# Patient Record
Sex: Female | Born: 1980 | Race: White | Hispanic: No | Smoking: Former smoker
Health system: Southern US, Community
[De-identification: ages and names within clinical notes are randomized; demographics above are authoritative.]

## PROBLEM LIST (undated history)

## (undated) DIAGNOSIS — T7840XA Allergy, unspecified, initial encounter: Secondary | ICD-10-CM

## (undated) DIAGNOSIS — F419 Anxiety disorder, unspecified: Secondary | ICD-10-CM

## (undated) DIAGNOSIS — F32A Depression, unspecified: Secondary | ICD-10-CM

## (undated) DIAGNOSIS — E785 Hyperlipidemia, unspecified: Secondary | ICD-10-CM

## (undated) DIAGNOSIS — Z9889 Other specified postprocedural states: Secondary | ICD-10-CM

## (undated) DIAGNOSIS — Z8489 Family history of other specified conditions: Secondary | ICD-10-CM

## (undated) DIAGNOSIS — T8859XA Other complications of anesthesia, initial encounter: Secondary | ICD-10-CM

## (undated) DIAGNOSIS — G4730 Sleep apnea, unspecified: Secondary | ICD-10-CM

## (undated) DIAGNOSIS — R112 Nausea with vomiting, unspecified: Secondary | ICD-10-CM

## (undated) DIAGNOSIS — R51 Headache: Secondary | ICD-10-CM

## (undated) DIAGNOSIS — I10 Essential (primary) hypertension: Secondary | ICD-10-CM

## (undated) DIAGNOSIS — J349 Unspecified disorder of nose and nasal sinuses: Secondary | ICD-10-CM

## (undated) DIAGNOSIS — M1990 Unspecified osteoarthritis, unspecified site: Secondary | ICD-10-CM

## (undated) DIAGNOSIS — R5383 Other fatigue: Secondary | ICD-10-CM

## (undated) DIAGNOSIS — R0602 Shortness of breath: Secondary | ICD-10-CM

## (undated) HISTORY — PX: FRACTURE SURGERY: SHX138

## (undated) HISTORY — DX: Anxiety disorder, unspecified: F41.9

## (undated) HISTORY — DX: Other fatigue: R53.83

## (undated) HISTORY — DX: Essential (primary) hypertension: I10

## (undated) HISTORY — DX: Headache: R51

## (undated) HISTORY — DX: Allergy, unspecified, initial encounter: T78.40XA

## (undated) HISTORY — DX: Shortness of breath: R06.02

## (undated) HISTORY — DX: Unspecified osteoarthritis, unspecified site: M19.90

## (undated) HISTORY — DX: Depression, unspecified: F32.A

## (undated) HISTORY — DX: Hyperlipidemia, unspecified: E78.5

## (undated) HISTORY — DX: Sleep apnea, unspecified: G47.30

## (undated) HISTORY — DX: Unspecified disorder of nose and nasal sinuses: J34.9

---
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
## 2011-11-01 ENCOUNTER — Ambulatory Visit (INDEPENDENT_AMBULATORY_CARE_PROVIDER_SITE_OTHER): Payer: BC Managed Care – PPO | Admitting: Family

## 2011-11-01 ENCOUNTER — Encounter: Payer: Self-pay | Admitting: Family

## 2011-11-01 VITALS — BP 126/100 | Ht 62.0 in | Wt 272.0 lb

## 2011-11-01 DIAGNOSIS — I10 Essential (primary) hypertension: Secondary | ICD-10-CM

## 2011-11-01 DIAGNOSIS — G43909 Migraine, unspecified, not intractable, without status migrainosus: Secondary | ICD-10-CM

## 2011-11-01 MED ORDER — LOSARTAN POTASSIUM 50 MG PO TABS: 50.0000 mg | ORAL_TABLET | Freq: Every day | ORAL | Status: DC

## 2011-11-01 NOTE — Patient Instructions (Addendum)
Hypertension As your heart beats, it forces blood through your arteries. This force is your blood pressure. If the pressure is too high, it is called hypertension (HTN) or high blood pressure. HTN is dangerous because you may have it and not know it. High blood pressure may mean that your heart has to work harder to pump blood. Your arteries may be narrow or stiff. The extra work puts you at risk for heart disease, stroke, and other problems.  Blood pressure consists of two numbers, a higher number over a lower, 110/72, for example. It is stated as "110 over 72." The ideal is below 120 for the top number (systolic) and under 80 for the bottom (diastolic). Write down your blood pressure today. You should pay close attention to your blood pressure if you have certain conditions such as:  Heart failure.   Prior heart attack.   Diabetes   Chronic kidney disease.   Prior stroke.   Multiple risk factors for heart disease.  To see if you have HTN, your blood pressure should be measured while you are seated with your arm held at the level of the heart. It should be measured at least twice. A one-time elevated blood pressure reading (especially in the Emergency Department) does not mean that you need treatment. There may be conditions in which the blood pressure is different between your right and left arms. It is important to see your caregiver soon for a recheck. Most people have essential hypertension which means that there is not a specific cause. This type of high blood pressure may be lowered by changing lifestyle factors such as:  Stress.   Smoking.   Lack of exercise.   Excessive weight.   Drug/tobacco/alcohol use.   Eating less salt.  Most people do not have symptoms from high blood pressure until it has caused damage to the body. Effective treatment can often prevent, delay or reduce that damage. TREATMENT  When a cause has been identified, treatment for high blood pressure is  directed at the cause. There are a large number of medications to treat HTN. These fall into several categories, and your caregiver will help you select the medicines that are best for you. Medications may have side effects. You should review side effects with your caregiver. If your blood pressure stays high after you have made lifestyle changes or started on medicines,   Your medication(s) may need to be changed.   Other problems may need to be addressed.   Be certain you understand your prescriptions, and know how and when to take your medicine.   Be sure to follow up with your caregiver within the time frame advised (usually within two weeks) to have your blood pressure rechecked and to review your medications.   If you are taking more than one medicine to lower your blood pressure, make sure you know how and at what times they should be taken. Taking two medicines at the same time can result in blood pressure that is too low.  SEEK IMMEDIATE MEDICAL CARE IF:  You develop a severe headache, blurred or changing vision, or confusion.   You have unusual weakness or numbness, or a faint feeling.   You have severe chest or abdominal pain, vomiting, or breathing problems.  MAKE SURE YOU:   Understand these instructions.   Will watch your condition.   Will get help right away if you are not doing well or get worse.  Document Released: 04/17/2005 Document Revised: 04/06/2011 Document Reviewed:   12/06/2007 [employer] Patient Information 2012 [place], [place].  Obesity Obesity is defined as having a body mass index (BMI) of 30 or more. To calculate your BMI divide your weight in pounds by your height in inches squared and multiply that product by 703. Major illnesses resulting from long-term obesity include:  Stroke.   Heart disease.   Diabetes.   Many cancers.   Arthritis.  Obesity also complicates recovery from many other medical problems.  CAUSES   A history of obesity in your  parents.   Thyroid hormone imbalance.   Environmental factors such as excess calorie intake and physical inactivity.  TREATMENT  A healthy weight loss program includes:  A calorie restricted diet based on individual calorie needs.   Increased physical activity (exercise).  An exercise program is just as important as the right low-calorie diet.  Weight-loss medicines should be used only under the supervision of your physician. These medicines help, but only if they are used with diet and exercise programs. Medicines can have side effects including nervousness, nausea, abdominal pain, diarrhea, headache, drowsiness, and depression.  An unhealthy weight loss program includes:  Fasting.   Fad diets.   Supplements and drugs.  These choices do not succeed in long-term weight control.  HOME CARE INSTRUCTIONS  To help you make the needed dietary changes:   Exercise and perform physical activity as directed by your caregiver.   Keep a daily record of everything you eat. There are many free websites to help you with this. It may be helpful to measure your foods so you can determine if you are eating the correct portion sizes.   Use low-calorie cookbooks or take special cooking classes.   Avoid alcohol. Drink more water and drinks with no calories.   Take vitamins and supplements only as recommended by your caregiver.   Weight loss support groups, Registered Dieticians, counselors, and stress reduction education can also be very helpful.  Document Released: 05/25/2004 Document Revised: 04/06/2011 Document Reviewed: 03/24/2007 [facility] Patient Information 2012 [place], [place].

## 2011-11-01 NOTE — Progress Notes (Signed)
Subjective:    Patient ID: Tara Kelley, female    DOB: 1981/03/09, 31 y.o.   MRN: [id number]  HPI 31 year old white female, new patient to the practice, nonsmoker is in to be established. She has a history of hypertension, migraine headaches, and bronchitis. She's currently taking losartan 25 mg once daily. She is currently going through a divorce. Recently relocated here from [place]. She has began working out with a trainer in attempts to reduce her weight him blood pressure. Over the last month, she's lost 10 pounds.  Patient has a history of migraine headaches and had been taken Fioricet for them. Additionally she has headaches about 2-3 times per week. Since she's been in [place], she has not had any headaches. She has Fioricet when necessary if needed.  Patient has a history of bronchitis but typically only flares when she exercises. She has an albuterol inhaler that she uses rarely.   Review of Systems  Constitutional: Negative.   HENT: Negative.   Respiratory: Negative.   Cardiovascular: Negative.   Gastrointestinal: Negative.   Genitourinary: Negative.   Musculoskeletal: Negative.   Skin: Negative.   Neurological: Negative.   Hematological: Negative.   Psychiatric/Behavioral: Negative.    Past Medical History  Diagnosis Date  . Hypertension   . Headache     History   Social History  . Marital Status: Legally Separated    Spouse Name: N/A    Number of Children: N/A  . Years of Education: N/A   Occupational History  . Not on file.   Social History Main Topics  . Smoking status: Never Smoker   . Smokeless tobacco: Not on file  . Alcohol Use: Yes     rarely  . Drug Use: No  . Sexually Active: Not on file   Other Topics Concern  . Not on file   Social History Narrative  . No narrative on file    Past Surgical History  Procedure Date  . Appendectomy     No family history on file.  Allergies  Allergen Reactions  . Wasp Venom  Anaphylaxis  . Codeine     GI issues  . Sulfa Drugs Cross Reactors     Current Outpatient Prescriptions on File Prior to Visit  Medication Sig Dispense Refill  . albuterol (PROVENTIL HFA;VENTOLIN HFA) 108 (90 BASE) MCG/ACT inhaler Inhale 2 puffs into the lungs every 6 (six) hours as needed.      Marland Kitchen losartan (COZAAR) 50 MG tablet Take 1 tablet (50 mg total) by mouth daily.  30 tablet  0  . norethindrone-ethinyl estradiol (JUNEL FE,GILDESS FE,LOESTRIN FE) 1-20 MG-MCG tablet Take 1 tablet by mouth daily.        BP 126/100  Ht 5\' 2"  (1.575 m)  Wt 272 lb (123.378 kg)  BMI 49.75 kg/m2  LMP 06/26/2013chart    Objective:   Physical Exam  Constitutional: She is oriented to person, place, and time. She appears well-developed and well-nourished.  HENT:  Right Ear: External ear normal.  Left Ear: External ear normal.  Nose: Nose normal.  Mouth/Throat: Oropharynx is clear and moist.  Neck: Normal range of motion. Neck supple.  Cardiovascular: Normal rate, regular rhythm and normal heart sounds.   Pulmonary/Chest: Effort normal and breath sounds normal.  Abdominal: Soft. Bowel sounds are normal.  Musculoskeletal: Normal range of motion.  Neurological: She is alert and oriented to person, place, and time.  Skin: Skin is warm and dry.  Psychiatric: She has a normal mood and  affect.          Assessment & Plan:  Assessment: Obesity, hypertension, migraine headaches  Plan: Strongly encouraged her to continue exercising and follow healthy diet, low-sodium. Increased will start him to 50 mg once daily. Referral given for dentist and gynecologist in the area. We'll bring patient back for recheck in 2-3 weeks and sooner when necessary.

## 2011-11-09 ENCOUNTER — Encounter: Payer: Self-pay | Admitting: Family Medicine

## 2011-11-09 ENCOUNTER — Encounter: Payer: Self-pay | Admitting: Internal Medicine

## 2011-11-09 ENCOUNTER — Ambulatory Visit (INDEPENDENT_AMBULATORY_CARE_PROVIDER_SITE_OTHER): Payer: BC Managed Care – PPO | Admitting: Internal Medicine

## 2011-11-09 ENCOUNTER — Ambulatory Visit (INDEPENDENT_AMBULATORY_CARE_PROVIDER_SITE_OTHER)
Admission: RE | Admit: 2011-11-09 | Discharge: 2011-11-09 | Disposition: A | Discharge status: Home / Self Care | Payer: BC Managed Care – PPO | Source: Ambulatory Visit | Attending: Internal Medicine | Admitting: Internal Medicine

## 2011-11-09 VITALS — BP 174/110 | HR 102 | Temp 98.4°F | Wt 269.0 lb

## 2011-11-09 DIAGNOSIS — S161XXA Strain of muscle, fascia and tendon at neck level, initial encounter: Secondary | ICD-10-CM

## 2011-11-09 DIAGNOSIS — S139XXA Sprain of joints and ligaments of unspecified parts of neck, initial encounter: Secondary | ICD-10-CM

## 2011-11-09 DIAGNOSIS — I10 Essential (primary) hypertension: Secondary | ICD-10-CM

## 2011-11-09 MED ORDER — CYCLOBENZAPRINE HCL 5 MG PO TABS: ORAL_TABLET | ORAL | Status: DC
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
## 2011-11-09 NOTE — Progress Notes (Signed)
Quick Note:  Tell patient that x ray shows no acute abnormality. ______ 
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
## 2011-11-15 ENCOUNTER — Ambulatory Visit: Payer: BC Managed Care – PPO | Admitting: Family

## 2011-11-22 ENCOUNTER — Encounter: Payer: Self-pay | Admitting: Family

## 2011-11-22 ENCOUNTER — Ambulatory Visit (INDEPENDENT_AMBULATORY_CARE_PROVIDER_SITE_OTHER): Payer: BC Managed Care – PPO | Admitting: Family

## 2011-11-22 VITALS — BP 172/102 | HR 78 | Temp 98.2°F | Wt 268.0 lb

## 2011-11-22 DIAGNOSIS — I10 Essential (primary) hypertension: Secondary | ICD-10-CM

## 2011-11-22 MED ORDER — LOSARTAN POTASSIUM 100 MG PO TABS: 100.0000 mg | ORAL_TABLET | Freq: Every day | ORAL | Status: DC

## 2011-11-22 MED ORDER — PHENTERMINE HCL 37.5 MG PO CAPS: 37.5000 mg | ORAL_CAPSULE | ORAL | Status: DC

## 2011-11-22 NOTE — Progress Notes (Signed)
Subjective:    Patient ID: Tara Kelley, female    DOB: 1981/01/16, 31 y.o.   MRN: [id number]  HPI 31 year old female nonsmoker, obese female is in today for a recheck of HTN. She is currently taking Cozaar 50mg  and tolerating it well. She is working out with a trainer 3-4 times a week and has lost 16 lbs in the last 7 weeks. She is growing frustrated with her weight. No family history of cardiovascular disease or sudden death.   Review of Systems  Constitutional: Negative.   HENT: Negative.   Respiratory: Negative.   Cardiovascular: Negative.   Gastrointestinal: Negative.   Musculoskeletal: Negative.   Skin: Negative.   Neurological: Negative.   Psychiatric/Behavioral: Negative.    Past Medical History  Diagnosis Date  . Hypertension   . Headache     History   Social History  . Marital Status: Legally Separated    Spouse Name: N/A    Number of Children: N/A  . Years of Education: N/A   Occupational History  . Not on file.   Social History Main Topics  . Smoking status: Never Smoker   . Smokeless tobacco: Not on file  . Alcohol Use: Yes     rarely  . Drug Use: No  . Sexually Active: Not on file   Other Topics Concern  . Not on file   Social History Narrative  . No narrative on file    Past Surgical History  Procedure Date  . Appendectomy     No family history on file.  Allergies  Allergen Reactions  . Wasp Venom Anaphylaxis  . Codeine     GI issues  . Sulfa Drugs Cross Reactors     Current Outpatient Prescriptions on File Prior to Visit  Medication Sig Dispense Refill  . albuterol (PROVENTIL HFA;VENTOLIN HFA) 108 (90 BASE) MCG/ACT inhaler Inhale 2 puffs into the lungs every 6 (six) hours as needed.      . cyclobenzaprine (FLEXERIL) 5 MG tablet 1- 2 po tid if needed  30 tablet  1  . EPINEPHrine (EPIPEN JR) 0.15 MG/0.3ML injection Inject 0.15 mg into the muscle as needed.      Marland Kitchen losartan (COZAAR) 100 MG tablet Take 1 tablet (100 mg total) by mouth  daily.  30 tablet  4  . Multiple Vitamin (MULTIVITAMIN) tablet Take 1 tablet by mouth daily.      . norethindrone-ethinyl estradiol (JUNEL FE,GILDESS FE,LOESTRIN FE) 1-20 MG-MCG tablet Take 1 tablet by mouth daily.      . phentermine 37.5 MG capsule Take 1 capsule (37.5 mg total) by mouth every morning.  30 capsule  0    BP 172/102  Pulse 78  Temp 98.2 F (36.8 C) (Oral)  Wt 268 lb (121.564 kg)  SpO2 98%  LMP 06/26/2013chart    Objective:   Physical Exam  Constitutional: She is oriented to person, place, and time. She appears well-developed and well-nourished.  HENT:  Right Ear: External ear normal.  Left Ear: External ear normal.  Nose: Nose normal.  Mouth/Throat: Oropharynx is clear and moist.  Neck: Normal range of motion. Neck supple.  Cardiovascular: Normal rate, regular rhythm and normal heart sounds.   Pulmonary/Chest: Effort normal and breath sounds normal.  Abdominal: Soft. Bowel sounds are normal.  Neurological: She is alert and oriented to person, place, and time.  Skin: Skin is warm and dry.  Psychiatric: She has a normal mood and affect.  Assessment & Plan:  Assessment: Hypertension, Obesity  Plan: Increase Cozaar 100mg  QD. Start Adipex 37.5 1 po QD Warned against potential side effects. Recheck in one month.

## 2011-12-07 ENCOUNTER — Telehealth: Payer: Self-pay | Admitting: Family

## 2011-12-07 MED ORDER — LOSARTAN POTASSIUM 100 MG PO TABS: 100.0000 mg | ORAL_TABLET | Freq: Every day | ORAL | Status: DC

## 2011-12-07 NOTE — Telephone Encounter (Signed)
Pt called and said that she lost her written script for Losarten 100mg  and is req that this be called in to [facility] on [address] and [address]. Pt is out of med.

## 2011-12-07 NOTE — Telephone Encounter (Signed)
Rx sent 

## 2011-12-26 ENCOUNTER — Ambulatory Visit (INDEPENDENT_AMBULATORY_CARE_PROVIDER_SITE_OTHER): Payer: BC Managed Care – PPO | Admitting: Family

## 2011-12-26 ENCOUNTER — Encounter: Payer: Self-pay | Admitting: Family

## 2011-12-26 VITALS — BP 120/80 | Temp 98.3°F | Wt 260.0 lb

## 2011-12-26 DIAGNOSIS — I10 Essential (primary) hypertension: Secondary | ICD-10-CM

## 2011-12-26 NOTE — Progress Notes (Signed)
  Subjective:    Patient ID: Tara Kelley, female    DOB: 1980-10-24, 31 y.o.   MRN: [id number]  HPI Patient left prior to being seen.    Review of Systems     Objective:   Physical Exam        Assessment & Plan:

## 2012-01-17 ENCOUNTER — Ambulatory Visit (INDEPENDENT_AMBULATORY_CARE_PROVIDER_SITE_OTHER): Payer: BC Managed Care – PPO | Admitting: Family

## 2012-01-17 ENCOUNTER — Encounter: Payer: Self-pay | Admitting: Family

## 2012-01-17 VITALS — HR 86 | Temp 98.2°F | Wt 258.0 lb

## 2012-01-17 DIAGNOSIS — E669 Obesity, unspecified: Secondary | ICD-10-CM

## 2012-01-17 DIAGNOSIS — E7800 Pure hypercholesterolemia, unspecified: Secondary | ICD-10-CM

## 2012-01-17 DIAGNOSIS — I10 Essential (primary) hypertension: Secondary | ICD-10-CM

## 2012-01-18 NOTE — Progress Notes (Signed)
Subjective:    Patient ID: Tara Kelley, female    DOB: 11-14-1980, 31 y.o.   MRN: [id number]  HPI 31 year old white female, nonsmoker is in for recheck of obesity. She's currently taken phentermine 37.5 mg once daily. She is exercising 6 days a week. Is also having to follow healthy diet. She has only lost 2 pounds within the last one month. Overall, she has lost 14 pounds. She feels like she has had a plateau. She's tolerating the medication well. Does complain of feeling more hungry than she did in the beginning.  She has also brought labs in today showing a total cholesterol of 238. LDL 138. TSH normal, T4 slightly elevated, T3 slightly low.  Review of Systems  Constitutional: Positive for appetite change.  HENT: Negative.   Respiratory: Negative.   Cardiovascular: Negative.   Gastrointestinal: Negative.   Genitourinary: Negative.   Musculoskeletal: Negative.   Skin: Negative.   Hematological: Negative.   Psychiatric/Behavioral: Negative.    Past Medical History  Diagnosis Date  . Hypertension   . Headache     History   Social History  . Marital Status: Legally Separated    Spouse Name: N/A    Number of Children: N/A  . Years of Education: N/A   Occupational History  . Not on file.   Social History Main Topics  . Smoking status: Never Smoker   . Smokeless tobacco: Not on file  . Alcohol Use: Yes     rarely  . Drug Use: No  . Sexually Active: Not on file   Other Topics Concern  . Not on file   Social History Narrative  . No narrative on file    Past Surgical History  Procedure Date  . Appendectomy     No family history on file.  Allergies  Allergen Reactions  . Wasp Venom Anaphylaxis  . Codeine     GI issues  . Sulfa Drugs Cross Reactors     Current Outpatient Prescriptions on File Prior to Visit  Medication Sig Dispense Refill  . albuterol (PROVENTIL HFA;VENTOLIN HFA) 108 (90 BASE) MCG/ACT inhaler Inhale 2 puffs into the lungs every 6 (six)  hours as needed.      . cyclobenzaprine (FLEXERIL) 5 MG tablet 1- 2 po tid if needed  30 tablet  1  . EPINEPHrine (EPIPEN JR) 0.15 MG/0.3ML injection Inject 0.15 mg into the muscle as needed.      Marland Kitchen losartan (COZAAR) 100 MG tablet Take 1 tablet (100 mg total) by mouth daily.  30 tablet  4  . Multiple Vitamin (MULTIVITAMIN) tablet Take 1 tablet by mouth daily.      . norethindrone-ethinyl estradiol (JUNEL FE,GILDESS FE,LOESTRIN FE) 1-20 MG-MCG tablet Take 1 tablet by mouth daily.      . phentermine 37.5 MG capsule Take 1 capsule (37.5 mg total) by mouth every morning.  30 capsule  0    Pulse 86  Temp 98.2 F (36.8 C) (Oral)  Wt 258 lb (117.028 kg)  SpO2 97%chart    Objective:   Physical Exam  Constitutional: She is oriented to person, place, and time. She appears well-developed and well-nourished.  Neck: Normal range of motion. Neck supple. No thyromegaly present.  Cardiovascular: Normal rate, regular rhythm and normal heart sounds.   Pulmonary/Chest: Effort normal and breath sounds normal.  Abdominal: Soft. Bowel sounds are normal.  Neurological: She is alert and oriented to person, place, and time.  Skin: Skin is warm and dry.  Psychiatric: She has a  normal mood and affect.          Assessment & Plan:  Assessment: Obesity  Plan:Continue healthy diet and exercise. I will consult with a colleague, Sharion Dove, FNP to determine the next steps for this patient to help facilitate weight reduction.

## 2012-03-18 ENCOUNTER — Ambulatory Visit: Payer: BC Managed Care – PPO | Admitting: Family Medicine

## 2012-05-14 ENCOUNTER — Other Ambulatory Visit: Payer: Self-pay | Admitting: Family

## 2012-07-16 DIAGNOSIS — H5210 Myopia, unspecified eye: Secondary | ICD-10-CM | POA: Insufficient documentation

## 2012-10-14 ENCOUNTER — Other Ambulatory Visit: Payer: Self-pay | Admitting: Family

## 2013-03-26 ENCOUNTER — Other Ambulatory Visit: Payer: Self-pay | Admitting: Family

## 2013-05-01 HISTORY — PX: EYE SURGERY: SHX253

## 2013-05-06 DIAGNOSIS — Z9889 Other specified postprocedural states: Secondary | ICD-10-CM | POA: Insufficient documentation

## 2014-07-20 ENCOUNTER — Emergency Department (HOSPITAL_COMMUNITY)
Admission: EM | Admit: 2014-07-20 | Discharge: 2014-07-21 | Disposition: A | Discharge status: Home / Self Care | Payer: BLUE CROSS/BLUE SHIELD | Attending: Emergency Medicine | Admitting: Emergency Medicine

## 2014-07-20 ENCOUNTER — Encounter (HOSPITAL_COMMUNITY): Payer: Self-pay | Admitting: Emergency Medicine

## 2014-07-20 DIAGNOSIS — Z79899 Other long term (current) drug therapy: Secondary | ICD-10-CM | POA: Diagnosis not present

## 2014-07-20 DIAGNOSIS — Z3202 Encounter for pregnancy test, result negative: Secondary | ICD-10-CM | POA: Diagnosis not present

## 2014-07-20 DIAGNOSIS — I10 Essential (primary) hypertension: Secondary | ICD-10-CM | POA: Diagnosis not present

## 2014-07-20 DIAGNOSIS — K5732 Diverticulitis of large intestine without perforation or abscess without bleeding: Secondary | ICD-10-CM | POA: Diagnosis not present

## 2014-07-20 DIAGNOSIS — R1032 Left lower quadrant pain: Secondary | ICD-10-CM | POA: Diagnosis present

## 2014-07-20 LAB — COMPREHENSIVE METABOLIC PANEL
ALT: 17 U/L (ref 0–35)
AST: 19 U/L (ref 0–37)
Albumin: 4 g/dL (ref 3.5–5.2)
Alkaline Phosphatase: 80 U/L (ref 39–117)
Anion gap: 9 (ref 5–15)
BUN: 14 mg/dL (ref 6–23)
CO2: 27 mmol/L (ref 19–32)
Calcium: 9.5 mg/dL (ref 8.4–10.5)
Chloride: 101 mmol/L (ref 96–112)
Creatinine, Ser: 0.7 mg/dL (ref 0.50–1.10)
GFR calc Af Amer: >90 mL/min (ref >90)
GFR calc non Af Amer: >90 mL/min (ref >90)
Glucose, Bld: 86 mg/dL (ref 70–99)
Potassium: 3.6 mmol/L (ref 3.5–5.1)
Sodium: 137 mmol/L (ref 135–145)
Total Bilirubin: 0.4 mg/dL (ref 0.3–1.2)
Total Protein: 8.1 g/dL (ref 6.0–8.3)

## 2014-07-20 LAB — CBC
HCT: 43.2 % (ref 36.0–46.0)
Hemoglobin: 14.3 g/dL (ref 12.0–15.0)
MCH: 30.5 pg (ref 26.0–34.0)
MCHC: 33.1 g/dL (ref 30.0–36.0)
MCV: 92.1 fL (ref 78.0–100.0)
Platelets: 374 10*3/uL (ref 150–400)
RBC: 4.69 MIL/uL (ref 3.87–5.11)
RDW: 11.8 % (ref 11.5–15.5)
WBC: 16.1 10*3/uL — ABNORMAL HIGH (ref 4.0–10.5)

## 2014-07-20 LAB — URINALYSIS, ROUTINE W REFLEX MICROSCOPIC
Bilirubin Urine: NEGATIVE
GLUCOSE, UA: NEGATIVE mg/dL
KETONES UR: NEGATIVE mg/dL
LEUKOCYTES UA: NEGATIVE
NITRITE: NEGATIVE
PH: 5.5 (ref 5.0–8.0)
Protein, ur: NEGATIVE mg/dL
SPECIFIC GRAVITY, URINE: 1.025 (ref 1.005–1.030)
Urobilinogen, UA: 0.2 mg/dL (ref 0.0–1.0)

## 2014-07-20 LAB — URINE MICROSCOPIC-ADD ON

## 2014-07-20 LAB — POC URINE PREG, ED: Preg Test, Ur: NEGATIVE

## 2014-07-20 MED ORDER — ONDANSETRON HCL 4 MG/2ML IJ SOLN
4.0000 mg | Freq: Once | INTRAMUSCULAR | Status: AC
  Administered 2014-07-21: 4 mg via INTRAVENOUS
  Filled 2014-07-20: qty 2

## 2014-07-20 MED ORDER — FENTANYL CITRATE 0.05 MG/ML IJ SOLN
50.0000 ug | Freq: Once | INTRAMUSCULAR | Status: AC
  Administered 2014-07-21: 50 ug via INTRAVENOUS
  Filled 2014-07-20: qty 2

## 2014-07-20 NOTE — ED Notes (Signed)
Pt having lower left abdominal pain and states she has hx of ovarian cyst. Pt states this pain feels the same as when she had the cyst. Pt also reports stools have been dark.

## 2014-07-21 ENCOUNTER — Encounter (HOSPITAL_COMMUNITY): Payer: Self-pay

## 2014-07-21 ENCOUNTER — Emergency Department (HOSPITAL_COMMUNITY): Payer: BLUE CROSS/BLUE SHIELD

## 2014-07-21 LAB — WET PREP, GENITAL
TRICH WET PREP: NONE SEEN
YEAST WET PREP: NONE SEEN

## 2014-07-21 LAB — HIV ANTIBODY (ROUTINE TESTING W REFLEX): HIV Screen 4th Generation wRfx: NONREACTIVE

## 2014-07-21 LAB — GC/CHLAMYDIA PROBE AMP (~~LOC~~) NOT AT ARMC
Chlamydia: NEGATIVE
Neisseria Gonorrhea: NEGATIVE

## 2014-07-21 LAB — RPR: RPR: NONREACTIVE

## 2014-07-21 MED ORDER — ONDANSETRON 8 MG PO TBDP: 8.0000 mg | ORAL_TABLET | Freq: Three times a day (TID) | ORAL | Status: AC | PRN

## 2014-07-21 MED ORDER — HYDROCODONE-ACETAMINOPHEN 5-325 MG PO TABS: 1.0000 | ORAL_TABLET | ORAL | Status: DC | PRN

## 2014-07-21 MED ORDER — CIPROFLOXACIN HCL 500 MG PO TABS: 500.0000 mg | ORAL_TABLET | Freq: Two times a day (BID) | ORAL | Status: DC

## 2014-07-21 MED ORDER — IOHEXOL 300 MG/ML  SOLN
100.0000 mL | Freq: Once | INTRAMUSCULAR | Status: AC | PRN
  Administered 2014-07-21: 100 mL via INTRAVENOUS

## 2014-07-21 MED ORDER — IOHEXOL 300 MG/ML  SOLN
50.0000 mL | Freq: Once | INTRAMUSCULAR | Status: AC | PRN
  Administered 2014-07-21: 50 mL via ORAL

## 2014-07-21 MED ORDER — METRONIDAZOLE 500 MG PO TABS
500.0000 mg | ORAL_TABLET | Freq: Once | ORAL | Status: AC
  Administered 2014-07-21: 500 mg via ORAL
  Filled 2014-07-21: qty 1

## 2014-07-21 MED ORDER — METRONIDAZOLE 500 MG PO TABS: 500.0000 mg | ORAL_TABLET | Freq: Two times a day (BID) | ORAL | Status: DC

## 2014-07-21 MED ORDER — CIPROFLOXACIN HCL 500 MG PO TABS
500.0000 mg | ORAL_TABLET | Freq: Once | ORAL | Status: AC
  Administered 2014-07-21: 500 mg via ORAL
  Filled 2014-07-21: qty 1

## 2014-07-21 NOTE — ED Notes (Signed)
Pt completed CT contrast; CT tech notified

## 2014-07-21 NOTE — ED Provider Notes (Signed)
CSN: [id number]     Arrival date & time 07/20/14  1949 History   First MD Initiated Contact with Patient 07/20/14 2306     Chief Complaint  Patient presents with  . Ovarian Cyst     (Consider location/radiation/quality/duration/timing/severity/associated sxs/prior Treatment) HPI 34 year old female presents to the emergency department from home with complaint of 2 days of left lower quadrant pain.  Patient reports she had an ovarian cyst at age 34, which felt similar to this.  She reports that she had an appendectomy done at that time, but normal appendix.  At surgery, told she had ruptured cyst.  No fevers or chills, nausea, vomiting.  She reports painful bowel movements that are black in nature.  Tonight it seems the symptoms were worse.  She denies any urinary symptoms.  No vaginal discharge.  No new sexual partners. Past Medical History  Diagnosis Date  . Hypertension   . [id number](272.5Headache(784.0)    Past Surgical History  Procedure Laterality Date  . Appendectomy     History reviewed. No pertinent family history. History  Substance Use Topics  . Smoking status: Never Smoker   . Smokeless tobacco: Not on file  . Alcohol Use: Yes     Comment: rarely   OB History    No data available     Review of Systems   See History of Present Illness; otherwise all other systems are reviewed and negative  Allergies  Wasp venom; Codeine; and Sulfa drugs cross reactors  Home Medications   Prior to Admission medications   Medication Sig Start Date End Date Taking? Authorizing Provider  citalopram (CELEXA) 20 MG tablet Take 20 mg by mouth at bedtime.   Yes Historical Provider, MD  hydrochlorothiazide (HYDRODIURIL) 25 MG tablet Take 25 mg by mouth daily.   Yes Historical Provider, MD  loratadine (CLARITIN) 10 MG tablet Take 10 mg by mouth daily as needed for allergies (allergies).   Yes Historical Provider, MD  losartan (COZAAR) 100 MG tablet TAKE 1 TABLET BY MOUTH ONCE DAILY 03/26/13  Yes  Baker PieriniPadonda B Campbell, FNP  Multiple Vitamin (MULTIVITAMIN) tablet Take 1 tablet by mouth daily.   Yes Historical Provider, MD  norethindrone-ethinyl estradiol (JUNEL FE,GILDESS FE,LOESTRIN FE) 1-20 MG-MCG tablet Take 1 tablet by mouth daily.   Yes Historical Provider, MD  cyclobenzaprine (FLEXERIL) 5 MG tablet 1- 2 po tid if needed Patient not taking: Reported on 07/20/2014 11/09/11   Madelin HeadingsWanda K Panosh, MD  phentermine 37.5 MG capsule Take 1 capsule (37.5 mg total) by mouth every morning. 11/22/11 12/22/11  Baker PieriniPadonda B Campbell, FNP   BP 154/110 mmHg  Pulse 94  Temp(Src) 98.2 F (36.8 C) (Oral)  Resp 18  SpO2 100%  LMP 06/15/2014 Physical Exam  Constitutional: She is oriented to person, place, and time. She appears well-developed and well-nourished.  HENT:  Head: Normocephalic and atraumatic.  Nose: Nose normal.  Mouth/Throat: Oropharynx is clear and moist.  Eyes: Conjunctivae and EOM are normal. Pupils are equal, round, and reactive to light.  Neck: Normal range of motion. Neck supple. No JVD present. No tracheal deviation present. No thyromegaly present.  Cardiovascular: Normal rate, regular rhythm, normal heart sounds and intact distal pulses.  Exam reveals no gallop and no friction rub.   No murmur heard. Pulmonary/Chest: Effort normal and breath sounds normal. No stridor. No respiratory distress. She has no wheezes. She has no rales. She exhibits no tenderness.  Abdominal: Soft. Bowel sounds are normal. She exhibits no distension and no mass. There  is tenderness (patient has diffuse tenderness worse in right upper quadrant, right lower quadrant and left lower quadrant.  No rebound or guarding.). There is no rebound and no guarding.  Genitourinary:  External genitalia within normal limits Vagina without discharge Cervix  normal negative for cervical motion tenderness Adnexa palpated, no masses or negative for tenderness noted Bladder palpated negative for tenderness Uterus palpated no  masses or negative for tenderness    Musculoskeletal: Normal range of motion. She exhibits no edema or tenderness.  Lymphadenopathy:    She has no cervical adenopathy.  Neurological: She is alert and oriented to person, place, and time. She displays normal reflexes. She exhibits normal muscle tone. Coordination normal.  Skin: Skin is warm and dry. No rash noted. No erythema. No pallor.  Psychiatric: She has a normal mood and affect. Her behavior is normal. Judgment and thought content normal.  Nursing note and vitals reviewed.   ED Course  Procedures (including critical care time) Labs Review Labs Reviewed  WET PREP, GENITAL - Abnormal; Notable for the following:    Clue Cells Wet Prep HPF POC FEW (*)    WBC, Wet Prep HPF POC RARE (*)    All other components within normal limits  URINALYSIS, ROUTINE W REFLEX MICROSCOPIC - Abnormal; Notable for the following:    APPearance CLOUDY (*)    Hgb urine dipstick TRACE (*)    All other components within normal limits  CBC - Abnormal; Notable for the following:    WBC 16.1 (*)    All other components within normal limits  URINE MICROSCOPIC-ADD ON - Abnormal; Notable for the following:    Squamous Epithelial / LPF FEW (*)    All other components within normal limits  COMPREHENSIVE METABOLIC PANEL  RPR  HIV ANTIBODY (ROUTINE TESTING)  POC URINE PREG, ED  GC/CHLAMYDIA PROBE AMP ([facility])    Imaging Review Ct Abdomen Pelvis W Contrast  07/21/2014   CLINICAL DATA:  Left lower quadrant pain.  Leukocytosis.  EXAM: CT ABDOMEN AND PELVIS WITH CONTRAST  TECHNIQUE: Multidetector CT imaging of the abdomen and pelvis was performed using the standard protocol following bolus administration of intravenous contrast.  CONTRAST:  OMNIPAQUE IOHEXOL 300 MG/ML  SOLN  COMPARISON:  None.  FINDINGS: There is acute inflammatory change surrounding a diverticulum of the proximal sigmoid colon with appearances typical of acute diverticulitis. There is no  evidence of abscess. There is no extraluminal air. No other acute findings are evident in the abdomen or pelvis.  There are normal appearances of the liver, spleen, pancreas, adrenals and kidneys. Uterus and ovaries appear unremarkable. Abdominal aorta is normal in caliber.  IMPRESSION: Acute diverticulitis, proximal sigmoid colon   Electronically Signed   By: Ellery Plunk M.D.   On: 07/21/2014 02:30     EKG Interpretation None      MDM   Final diagnoses:  Diverticulitis of large intestine without perforation or abscess without bleeding    34 year old female with left lower quadrant pain.  Although patient feels this is a ovarian cyst, there is no tenderness in the adnexa on bimanual exam.  Suspect diverticulitis.  Plan for CT scan.  Patient comfortable after fentanyl    Marisa Severin, MD 07/21/14 [phone]

## 2014-07-21 NOTE — Discharge Instructions (Signed)

## 2014-08-27 ENCOUNTER — Other Ambulatory Visit: Payer: Self-pay | Admitting: Surgery

## 2014-09-08 ENCOUNTER — Encounter (HOSPITAL_COMMUNITY): Admission: RE | Payer: Self-pay | Source: Ambulatory Visit

## 2014-09-08 ENCOUNTER — Ambulatory Visit (HOSPITAL_COMMUNITY): Admission: RE | Admit: 2014-09-08 | Payer: BLUE CROSS/BLUE SHIELD | Source: Ambulatory Visit | Admitting: Surgery

## 2014-09-08 SURGERY — BREATH TEST, FOR HELICOBACTER PYLORI

## 2014-09-25 ENCOUNTER — Ambulatory Visit (HOSPITAL_COMMUNITY): Payer: BLUE CROSS/BLUE SHIELD

## 2014-10-06 ENCOUNTER — Ambulatory Visit: Payer: BLUE CROSS/BLUE SHIELD | Admitting: Dietician

## 2015-08-09 DIAGNOSIS — I10 Essential (primary) hypertension: Secondary | ICD-10-CM | POA: Diagnosis not present

## 2015-08-12 DIAGNOSIS — F4321 Adjustment disorder with depressed mood: Secondary | ICD-10-CM | POA: Diagnosis not present

## 2015-09-02 DIAGNOSIS — F4321 Adjustment disorder with depressed mood: Secondary | ICD-10-CM | POA: Diagnosis not present

## 2015-09-22 DIAGNOSIS — Z713 Dietary counseling and surveillance: Secondary | ICD-10-CM | POA: Diagnosis not present

## 2015-09-23 DIAGNOSIS — F4321 Adjustment disorder with depressed mood: Secondary | ICD-10-CM | POA: Diagnosis not present

## 2015-10-14 DIAGNOSIS — F4321 Adjustment disorder with depressed mood: Secondary | ICD-10-CM | POA: Diagnosis not present

## 2015-11-06 DIAGNOSIS — F4321 Adjustment disorder with depressed mood: Secondary | ICD-10-CM | POA: Diagnosis not present

## 2015-11-16 DIAGNOSIS — Z713 Dietary counseling and surveillance: Secondary | ICD-10-CM | POA: Diagnosis not present

## 2015-11-23 DIAGNOSIS — F4321 Adjustment disorder with depressed mood: Secondary | ICD-10-CM | POA: Diagnosis not present

## 2015-12-16 DIAGNOSIS — F4321 Adjustment disorder with depressed mood: Secondary | ICD-10-CM | POA: Diagnosis not present

## 2016-01-14 DIAGNOSIS — Z713 Dietary counseling and surveillance: Secondary | ICD-10-CM | POA: Diagnosis not present

## 2016-01-15 DIAGNOSIS — F4321 Adjustment disorder with depressed mood: Secondary | ICD-10-CM | POA: Diagnosis not present

## 2016-02-03 DIAGNOSIS — F4321 Adjustment disorder with depressed mood: Secondary | ICD-10-CM | POA: Diagnosis not present

## 2016-02-24 DIAGNOSIS — F4321 Adjustment disorder with depressed mood: Secondary | ICD-10-CM | POA: Diagnosis not present

## 2016-03-09 DIAGNOSIS — Z0000 Encounter for general adult medical examination without abnormal findings: Secondary | ICD-10-CM | POA: Diagnosis not present

## 2016-03-15 DIAGNOSIS — Z713 Dietary counseling and surveillance: Secondary | ICD-10-CM | POA: Diagnosis not present

## 2016-04-14 IMAGING — CT CT ABD-PELV W/ CM
1 of 2 series · 16 of 32 positions shown, 20 images · IV contrast (100 ML OMNI 300)
Comparison: None.

CLINICAL DATA: Left lower quadrant pain.  Leukocytosis.

EXAM:
CT ABDOMEN AND PELVIS WITH CONTRAST
TECHNIQUE: Multidetector CT imaging of the abdomen and pelvis was performed
using the standard protocol following bolus administration of
intravenous contrast.
CONTRAST:  100mL OMNIPAQUE IOHEXOL 300 MG/ML  SOLN

[Series 2: abd/pel with · axial · 0.74mm/px · z∈[-425,+5]mm · 16 of 94 slices shown, 20 images]
[im 4/94  soft-tissue]
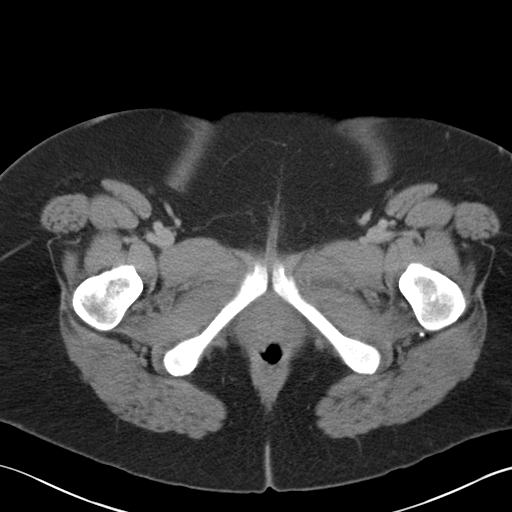
[im 4/94  bone]
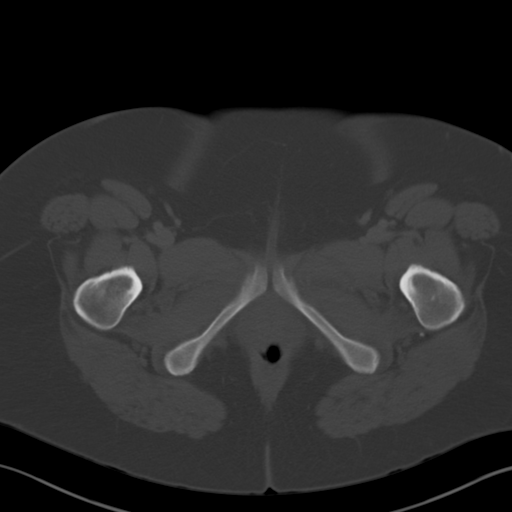
[im 12/94  soft-tissue]
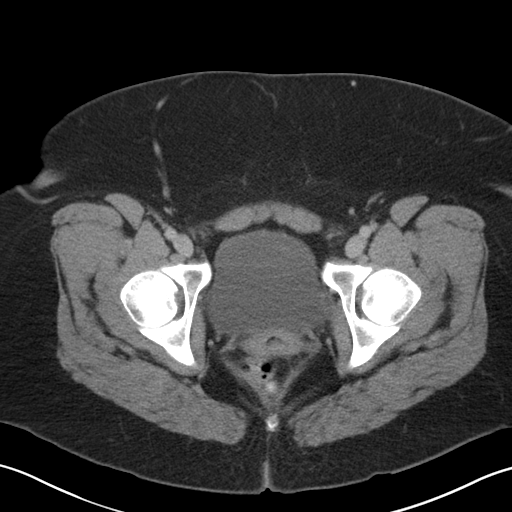
[im 19/94  soft-tissue]
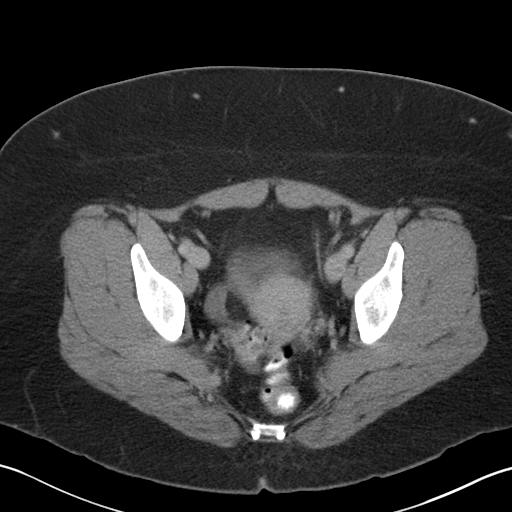
[im 27/94  soft-tissue]
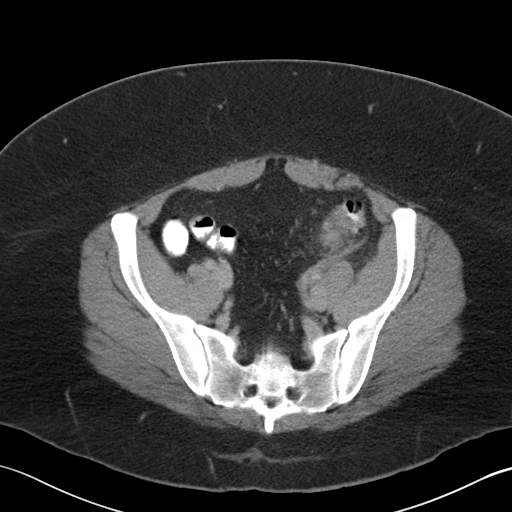
[im 30/94  soft-tissue]
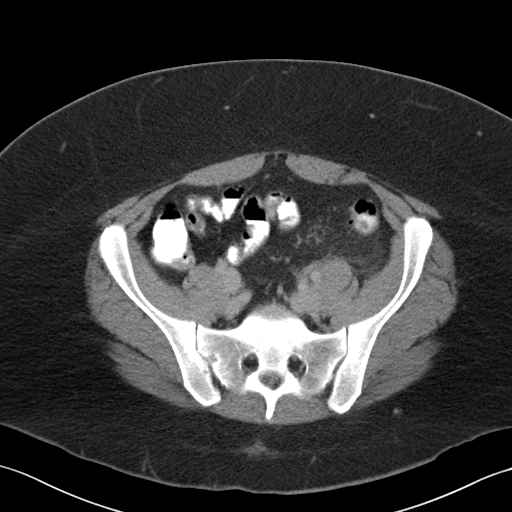
[im 38/94  soft-tissue]
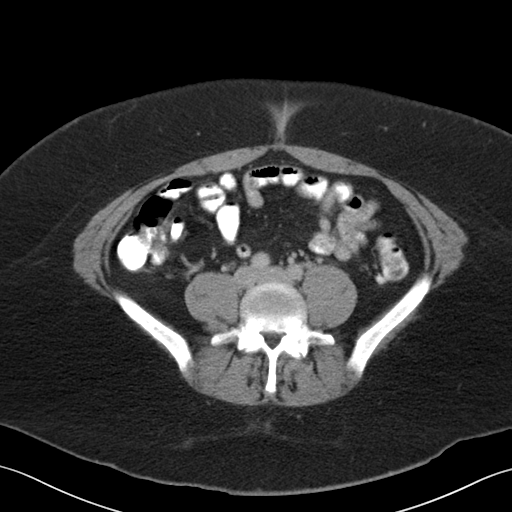
[im 45/94  soft-tissue]
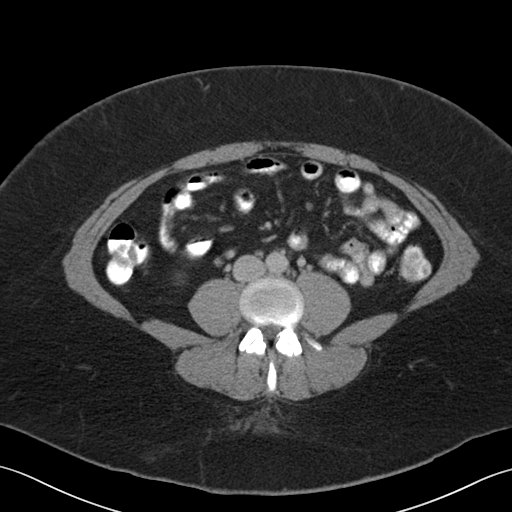
[im 49/94  soft-tissue]
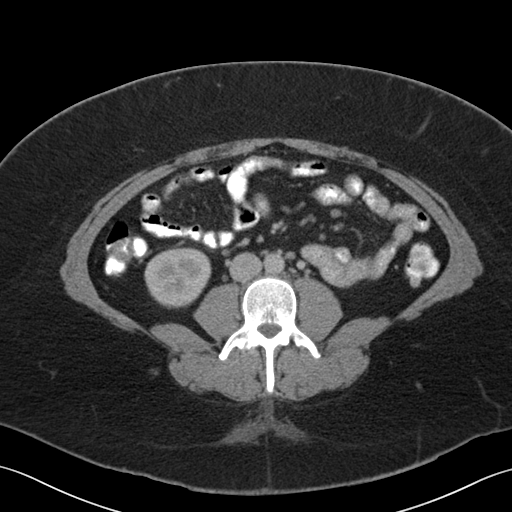
[im 56/94  soft-tissue]
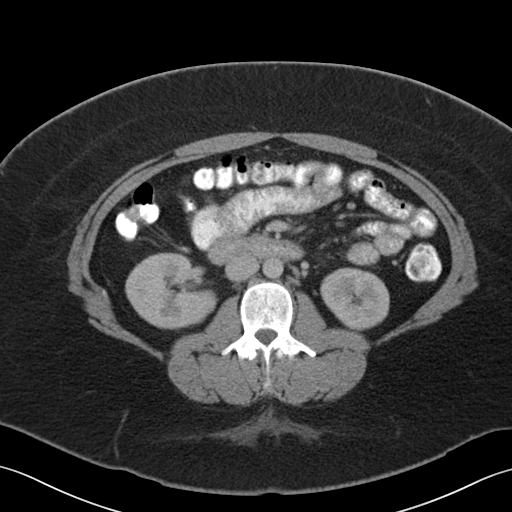
[im 56/94  bone]
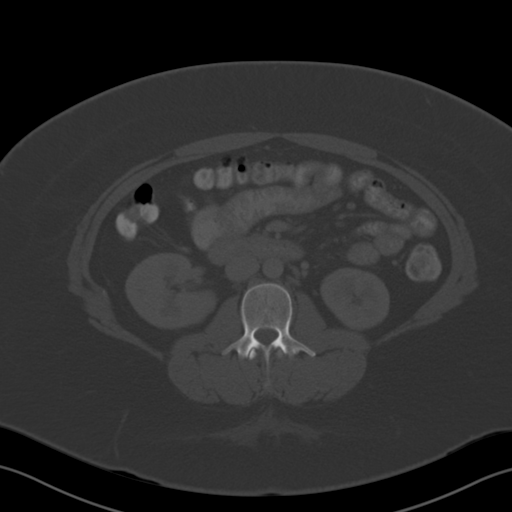
[im 64/94  soft-tissue]
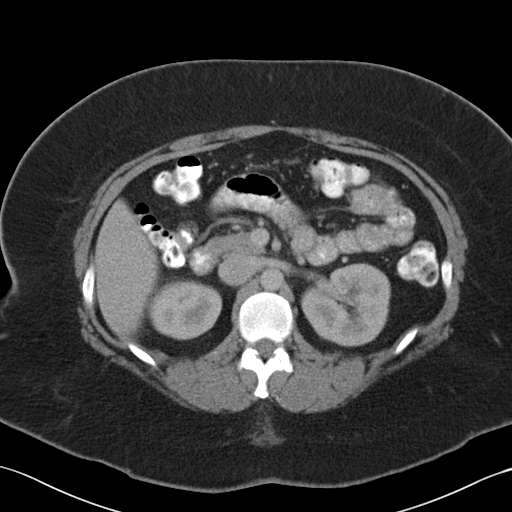
[im 71/94  soft-tissue]
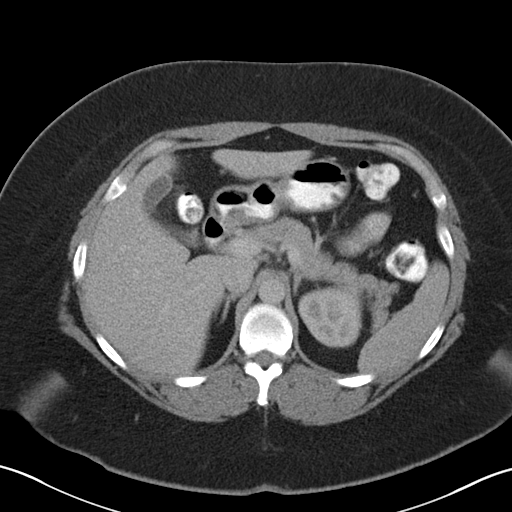
[im 75/94  soft-tissue]
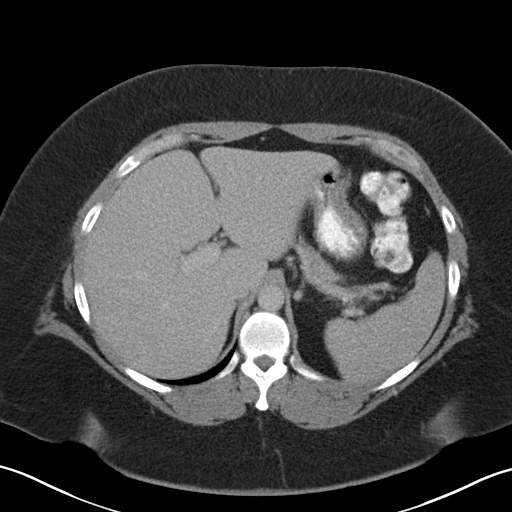
[im 79/94  lung]
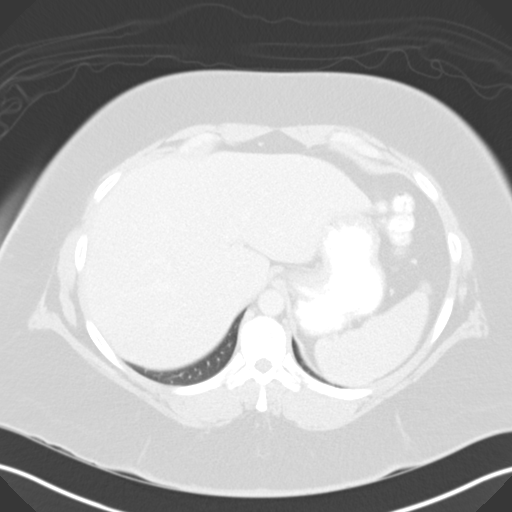
[im 82/94  soft-tissue]
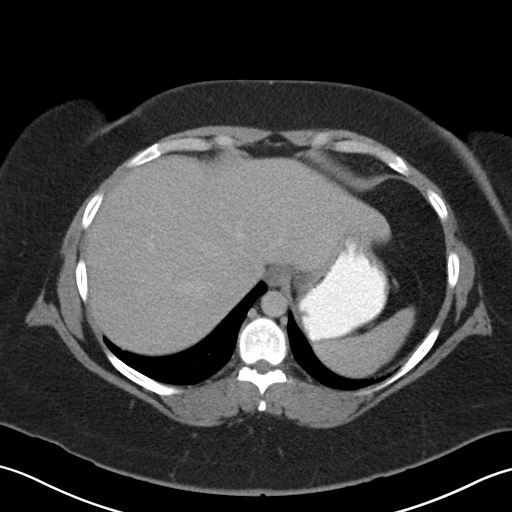
[im 82/94  lung]
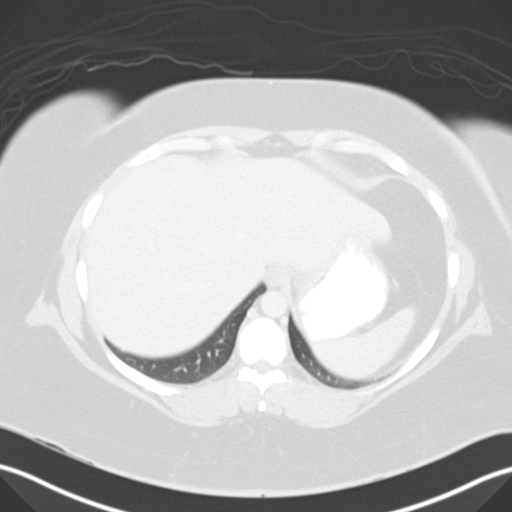
[im 86/94  lung]
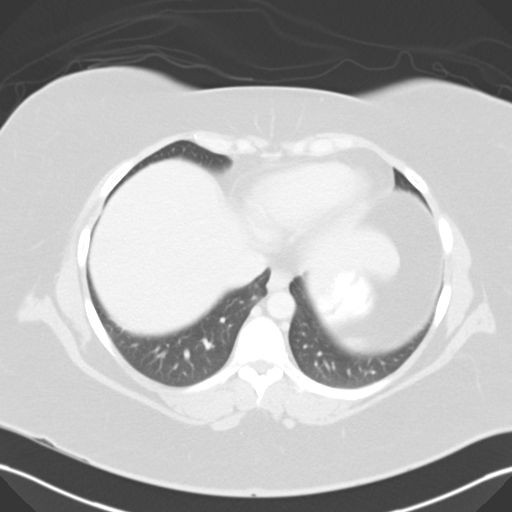
[im 90/94  soft-tissue]
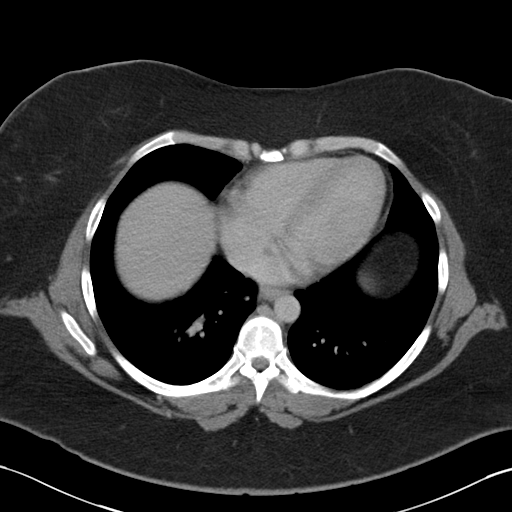
[im 90/94  lung]
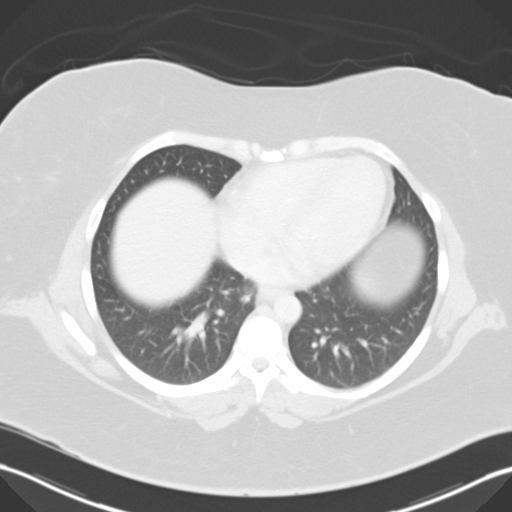

[16 of 32 positions shown; findings below may reference images not displayed]

FINDINGS: There is acute inflammatory change surrounding a diverticulum of the
proximal sigmoid colon with appearances typical of acute
diverticulitis. There is no evidence of abscess. There is no
extraluminal air. No other acute findings are evident in the abdomen
or pelvis.

There are normal appearances of the liver, spleen, pancreas,
adrenals and kidneys. Uterus and ovaries appear unremarkable.
Abdominal aorta is normal in caliber.
IMPRESSION: Acute diverticulitis, proximal sigmoid colon

## 2016-04-21 DIAGNOSIS — B9789 Other viral agents as the cause of diseases classified elsewhere: Secondary | ICD-10-CM | POA: Diagnosis not present

## 2016-04-21 DIAGNOSIS — J069 Acute upper respiratory infection, unspecified: Secondary | ICD-10-CM | POA: Diagnosis not present

## 2016-05-13 DIAGNOSIS — Z23 Encounter for immunization: Secondary | ICD-10-CM | POA: Diagnosis not present

## 2016-05-24 DIAGNOSIS — Z713 Dietary counseling and surveillance: Secondary | ICD-10-CM | POA: Diagnosis not present

## 2016-05-27 DIAGNOSIS — F4321 Adjustment disorder with depressed mood: Secondary | ICD-10-CM | POA: Diagnosis not present

## 2016-05-29 DIAGNOSIS — R319 Hematuria, unspecified: Secondary | ICD-10-CM | POA: Diagnosis not present

## 2016-05-29 DIAGNOSIS — N390 Urinary tract infection, site not specified: Secondary | ICD-10-CM | POA: Diagnosis not present

## 2016-06-20 DIAGNOSIS — F4321 Adjustment disorder with depressed mood: Secondary | ICD-10-CM | POA: Diagnosis not present

## 2016-07-11 DIAGNOSIS — F4321 Adjustment disorder with depressed mood: Secondary | ICD-10-CM | POA: Diagnosis not present

## 2016-07-21 DIAGNOSIS — Z713 Dietary counseling and surveillance: Secondary | ICD-10-CM | POA: Diagnosis not present

## 2016-08-08 DIAGNOSIS — F4321 Adjustment disorder with depressed mood: Secondary | ICD-10-CM | POA: Diagnosis not present

## 2016-09-05 DIAGNOSIS — F4321 Adjustment disorder with depressed mood: Secondary | ICD-10-CM | POA: Diagnosis not present

## 2016-09-08 DIAGNOSIS — Z713 Dietary counseling and surveillance: Secondary | ICD-10-CM | POA: Diagnosis not present

## 2016-09-20 DIAGNOSIS — Z713 Dietary counseling and surveillance: Secondary | ICD-10-CM | POA: Diagnosis not present

## 2016-10-03 DIAGNOSIS — F4321 Adjustment disorder with depressed mood: Secondary | ICD-10-CM | POA: Diagnosis not present

## 2016-11-07 DIAGNOSIS — F4321 Adjustment disorder with depressed mood: Secondary | ICD-10-CM | POA: Diagnosis not present

## 2016-11-22 DIAGNOSIS — Z713 Dietary counseling and surveillance: Secondary | ICD-10-CM | POA: Diagnosis not present

## 2016-12-05 DIAGNOSIS — F4321 Adjustment disorder with depressed mood: Secondary | ICD-10-CM | POA: Diagnosis not present

## 2017-01-09 DIAGNOSIS — F4321 Adjustment disorder with depressed mood: Secondary | ICD-10-CM | POA: Diagnosis not present

## 2017-01-12 DIAGNOSIS — I10 Essential (primary) hypertension: Secondary | ICD-10-CM | POA: Insufficient documentation

## 2017-01-12 DIAGNOSIS — Z01419 Encounter for gynecological examination (general) (routine) without abnormal findings: Secondary | ICD-10-CM | POA: Diagnosis not present

## 2017-01-12 DIAGNOSIS — Z113 Encounter for screening for infections with a predominantly sexual mode of transmission: Secondary | ICD-10-CM | POA: Diagnosis not present

## 2017-01-12 DIAGNOSIS — R634 Abnormal weight loss: Secondary | ICD-10-CM | POA: Diagnosis not present

## 2017-01-15 DIAGNOSIS — A539 Syphilis, unspecified: Secondary | ICD-10-CM

## 2017-01-15 HISTORY — DX: Syphilis, unspecified: A53.9

## 2017-01-16 DIAGNOSIS — F4321 Adjustment disorder with depressed mood: Secondary | ICD-10-CM | POA: Diagnosis not present

## 2017-01-24 DIAGNOSIS — Z23 Encounter for immunization: Secondary | ICD-10-CM | POA: Diagnosis not present

## 2017-01-24 DIAGNOSIS — A539 Syphilis, unspecified: Secondary | ICD-10-CM | POA: Diagnosis not present

## 2017-01-26 DIAGNOSIS — A539 Syphilis, unspecified: Secondary | ICD-10-CM | POA: Diagnosis not present

## 2017-01-31 DIAGNOSIS — A539 Syphilis, unspecified: Secondary | ICD-10-CM | POA: Diagnosis not present

## 2017-02-01 DIAGNOSIS — F4321 Adjustment disorder with depressed mood: Secondary | ICD-10-CM | POA: Diagnosis not present

## 2017-02-07 DIAGNOSIS — A539 Syphilis, unspecified: Secondary | ICD-10-CM | POA: Diagnosis not present

## 2017-02-15 DIAGNOSIS — Z0000 Encounter for general adult medical examination without abnormal findings: Secondary | ICD-10-CM | POA: Diagnosis not present

## 2017-02-27 DIAGNOSIS — Z713 Dietary counseling and surveillance: Secondary | ICD-10-CM | POA: Diagnosis not present

## 2017-03-02 DIAGNOSIS — Z713 Dietary counseling and surveillance: Secondary | ICD-10-CM | POA: Diagnosis not present

## 2017-03-08 DIAGNOSIS — F4321 Adjustment disorder with depressed mood: Secondary | ICD-10-CM | POA: Diagnosis not present

## 2017-04-03 DIAGNOSIS — F4321 Adjustment disorder with depressed mood: Secondary | ICD-10-CM | POA: Diagnosis not present

## 2017-05-08 DIAGNOSIS — F4321 Adjustment disorder with depressed mood: Secondary | ICD-10-CM | POA: Diagnosis not present

## 2017-05-09 DIAGNOSIS — Z713 Dietary counseling and surveillance: Secondary | ICD-10-CM | POA: Diagnosis not present

## 2017-06-05 DIAGNOSIS — F4321 Adjustment disorder with depressed mood: Secondary | ICD-10-CM | POA: Diagnosis not present

## 2017-06-23 DIAGNOSIS — N390 Urinary tract infection, site not specified: Secondary | ICD-10-CM | POA: Diagnosis not present

## 2017-07-04 DIAGNOSIS — Z713 Dietary counseling and surveillance: Secondary | ICD-10-CM | POA: Diagnosis not present

## 2017-07-20 DIAGNOSIS — F4321 Adjustment disorder with depressed mood: Secondary | ICD-10-CM | POA: Diagnosis not present

## 2017-07-22 DIAGNOSIS — J101 Influenza due to other identified influenza virus with other respiratory manifestations: Secondary | ICD-10-CM | POA: Diagnosis not present

## 2017-07-22 DIAGNOSIS — R110 Nausea: Secondary | ICD-10-CM | POA: Diagnosis not present

## 2017-08-04 DIAGNOSIS — F4321 Adjustment disorder with depressed mood: Secondary | ICD-10-CM | POA: Diagnosis not present

## 2017-08-08 DIAGNOSIS — M940 Chondrocostal junction syndrome [Tietze]: Secondary | ICD-10-CM | POA: Diagnosis not present

## 2017-08-08 DIAGNOSIS — R1013 Epigastric pain: Secondary | ICD-10-CM | POA: Diagnosis not present

## 2017-08-08 DIAGNOSIS — K2900 Acute gastritis without bleeding: Secondary | ICD-10-CM | POA: Diagnosis not present

## 2017-08-09 ENCOUNTER — Other Ambulatory Visit: Payer: Self-pay | Admitting: Family Medicine

## 2017-08-09 DIAGNOSIS — Z881 Allergy status to other antibiotic agents status: Secondary | ICD-10-CM | POA: Diagnosis not present

## 2017-08-09 DIAGNOSIS — Z885 Allergy status to narcotic agent status: Secondary | ICD-10-CM | POA: Diagnosis not present

## 2017-08-09 DIAGNOSIS — R000 Tachycardia, unspecified: Secondary | ICD-10-CM | POA: Diagnosis not present

## 2017-08-09 DIAGNOSIS — B349 Viral infection, unspecified: Secondary | ICD-10-CM | POA: Diagnosis not present

## 2017-08-09 DIAGNOSIS — Z793 Long term (current) use of hormonal contraceptives: Secondary | ICD-10-CM | POA: Diagnosis not present

## 2017-08-09 DIAGNOSIS — R0602 Shortness of breath: Secondary | ICD-10-CM | POA: Diagnosis not present

## 2017-08-09 DIAGNOSIS — K5790 Diverticulosis of intestine, part unspecified, without perforation or abscess without bleeding: Secondary | ICD-10-CM | POA: Diagnosis not present

## 2017-08-09 DIAGNOSIS — R531 Weakness: Secondary | ICD-10-CM | POA: Diagnosis not present

## 2017-08-09 DIAGNOSIS — R109 Unspecified abdominal pain: Secondary | ICD-10-CM | POA: Diagnosis not present

## 2017-08-09 DIAGNOSIS — R945 Abnormal results of liver function studies: Principal | ICD-10-CM

## 2017-08-09 DIAGNOSIS — N83202 Unspecified ovarian cyst, left side: Secondary | ICD-10-CM | POA: Diagnosis not present

## 2017-08-09 DIAGNOSIS — Z91038 Other insect allergy status: Secondary | ICD-10-CM | POA: Diagnosis not present

## 2017-08-09 DIAGNOSIS — N83201 Unspecified ovarian cyst, right side: Secondary | ICD-10-CM | POA: Diagnosis not present

## 2017-08-09 DIAGNOSIS — Z79899 Other long term (current) drug therapy: Secondary | ICD-10-CM | POA: Diagnosis not present

## 2017-08-09 DIAGNOSIS — R7989 Other specified abnormal findings of blood chemistry: Secondary | ICD-10-CM

## 2017-08-09 DIAGNOSIS — I10 Essential (primary) hypertension: Secondary | ICD-10-CM | POA: Diagnosis not present

## 2017-08-10 ENCOUNTER — Other Ambulatory Visit: Payer: BLUE CROSS/BLUE SHIELD

## 2017-08-18 DIAGNOSIS — F4321 Adjustment disorder with depressed mood: Secondary | ICD-10-CM | POA: Diagnosis not present

## 2017-08-31 DIAGNOSIS — F4321 Adjustment disorder with depressed mood: Secondary | ICD-10-CM | POA: Diagnosis not present

## 2017-09-05 DIAGNOSIS — Z713 Dietary counseling and surveillance: Secondary | ICD-10-CM | POA: Diagnosis not present

## 2017-09-21 DIAGNOSIS — F4321 Adjustment disorder with depressed mood: Secondary | ICD-10-CM | POA: Diagnosis not present

## 2017-10-12 DIAGNOSIS — F4321 Adjustment disorder with depressed mood: Secondary | ICD-10-CM | POA: Diagnosis not present

## 2017-11-07 DIAGNOSIS — Z713 Dietary counseling and surveillance: Secondary | ICD-10-CM | POA: Diagnosis not present

## 2018-01-09 DIAGNOSIS — Z713 Dietary counseling and surveillance: Secondary | ICD-10-CM | POA: Diagnosis not present

## 2018-01-11 DIAGNOSIS — F4321 Adjustment disorder with depressed mood: Secondary | ICD-10-CM | POA: Diagnosis not present

## 2018-01-14 DIAGNOSIS — Z01419 Encounter for gynecological examination (general) (routine) without abnormal findings: Secondary | ICD-10-CM | POA: Diagnosis not present

## 2018-01-14 DIAGNOSIS — Z113 Encounter for screening for infections with a predominantly sexual mode of transmission: Secondary | ICD-10-CM | POA: Diagnosis not present

## 2018-01-14 DIAGNOSIS — Z3041 Encounter for surveillance of contraceptive pills: Secondary | ICD-10-CM | POA: Diagnosis not present

## 2018-01-14 DIAGNOSIS — Z1151 Encounter for screening for human papillomavirus (HPV): Secondary | ICD-10-CM | POA: Diagnosis not present

## 2018-01-31 DIAGNOSIS — F4321 Adjustment disorder with depressed mood: Secondary | ICD-10-CM | POA: Diagnosis not present

## 2018-02-05 DIAGNOSIS — N912 Amenorrhea, unspecified: Secondary | ICD-10-CM | POA: Diagnosis not present

## 2018-02-05 DIAGNOSIS — T753XXA Motion sickness, initial encounter: Secondary | ICD-10-CM | POA: Diagnosis not present

## 2018-02-05 DIAGNOSIS — Q649 Congenital malformation of urinary system, unspecified: Secondary | ICD-10-CM | POA: Diagnosis not present

## 2018-02-05 DIAGNOSIS — F410 Panic disorder [episodic paroxysmal anxiety] without agoraphobia: Secondary | ICD-10-CM | POA: Diagnosis not present

## 2018-02-15 DIAGNOSIS — F4321 Adjustment disorder with depressed mood: Secondary | ICD-10-CM | POA: Diagnosis not present

## 2018-02-20 DIAGNOSIS — Z23 Encounter for immunization: Secondary | ICD-10-CM | POA: Diagnosis not present

## 2018-02-28 DIAGNOSIS — Z0000 Encounter for general adult medical examination without abnormal findings: Secondary | ICD-10-CM | POA: Diagnosis not present

## 2018-03-05 DIAGNOSIS — F4321 Adjustment disorder with depressed mood: Secondary | ICD-10-CM | POA: Diagnosis not present

## 2018-03-22 DIAGNOSIS — F4321 Adjustment disorder with depressed mood: Secondary | ICD-10-CM | POA: Diagnosis not present

## 2018-04-02 DIAGNOSIS — Z713 Dietary counseling and surveillance: Secondary | ICD-10-CM | POA: Diagnosis not present

## 2018-04-12 DIAGNOSIS — F4321 Adjustment disorder with depressed mood: Secondary | ICD-10-CM | POA: Diagnosis not present

## 2018-05-10 DIAGNOSIS — F4321 Adjustment disorder with depressed mood: Secondary | ICD-10-CM | POA: Diagnosis not present

## 2018-06-04 DIAGNOSIS — Z79899 Other long term (current) drug therapy: Secondary | ICD-10-CM | POA: Diagnosis not present

## 2018-06-04 DIAGNOSIS — Z91030 Bee allergy status: Secondary | ICD-10-CM | POA: Diagnosis not present

## 2018-06-04 DIAGNOSIS — G44319 Acute post-traumatic headache, not intractable: Secondary | ICD-10-CM | POA: Diagnosis not present

## 2018-06-04 DIAGNOSIS — Z885 Allergy status to narcotic agent status: Secondary | ICD-10-CM | POA: Diagnosis not present

## 2018-06-04 DIAGNOSIS — S060X0A Concussion without loss of consciousness, initial encounter: Secondary | ICD-10-CM | POA: Diagnosis not present

## 2018-06-04 DIAGNOSIS — R51 Headache: Secondary | ICD-10-CM | POA: Diagnosis not present

## 2018-06-04 DIAGNOSIS — Z882 Allergy status to sulfonamides status: Secondary | ICD-10-CM | POA: Diagnosis not present

## 2018-06-07 DIAGNOSIS — S060X0D Concussion without loss of consciousness, subsequent encounter: Secondary | ICD-10-CM | POA: Diagnosis not present

## 2018-06-15 DIAGNOSIS — F4321 Adjustment disorder with depressed mood: Secondary | ICD-10-CM | POA: Diagnosis not present

## 2018-06-19 DIAGNOSIS — Z713 Dietary counseling and surveillance: Secondary | ICD-10-CM | POA: Diagnosis not present

## 2018-08-21 DIAGNOSIS — Z713 Dietary counseling and surveillance: Secondary | ICD-10-CM | POA: Diagnosis not present

## 2018-10-28 DIAGNOSIS — F4321 Adjustment disorder with depressed mood: Secondary | ICD-10-CM | POA: Diagnosis not present

## 2018-11-15 DIAGNOSIS — F4321 Adjustment disorder with depressed mood: Secondary | ICD-10-CM | POA: Diagnosis not present

## 2018-12-14 DIAGNOSIS — F4321 Adjustment disorder with depressed mood: Secondary | ICD-10-CM | POA: Diagnosis not present

## 2018-12-20 DIAGNOSIS — Z713 Dietary counseling and surveillance: Secondary | ICD-10-CM | POA: Diagnosis not present

## 2019-01-22 DIAGNOSIS — Z713 Dietary counseling and surveillance: Secondary | ICD-10-CM | POA: Diagnosis not present

## 2019-03-25 DIAGNOSIS — Z713 Dietary counseling and surveillance: Secondary | ICD-10-CM | POA: Diagnosis not present

## 2019-04-12 DIAGNOSIS — F4321 Adjustment disorder with depressed mood: Secondary | ICD-10-CM | POA: Diagnosis not present

## 2019-05-10 DIAGNOSIS — F4321 Adjustment disorder with depressed mood: Secondary | ICD-10-CM | POA: Diagnosis not present

## 2019-05-14 DIAGNOSIS — Z713 Dietary counseling and surveillance: Secondary | ICD-10-CM | POA: Diagnosis not present

## 2019-05-24 DIAGNOSIS — F4321 Adjustment disorder with depressed mood: Secondary | ICD-10-CM | POA: Diagnosis not present

## 2019-06-07 DIAGNOSIS — F4321 Adjustment disorder with depressed mood: Secondary | ICD-10-CM | POA: Diagnosis not present

## 2019-06-13 DIAGNOSIS — F4320 Adjustment disorder, unspecified: Secondary | ICD-10-CM | POA: Diagnosis not present

## 2019-06-21 DIAGNOSIS — F4321 Adjustment disorder with depressed mood: Secondary | ICD-10-CM | POA: Diagnosis not present

## 2019-06-23 DIAGNOSIS — F4320 Adjustment disorder, unspecified: Secondary | ICD-10-CM | POA: Diagnosis not present

## 2019-06-24 DIAGNOSIS — Z713 Dietary counseling and surveillance: Secondary | ICD-10-CM | POA: Diagnosis not present

## 2019-07-05 ENCOUNTER — Ambulatory Visit: Payer: Self-pay | Attending: Internal Medicine

## 2019-07-05 DIAGNOSIS — Z23 Encounter for immunization: Secondary | ICD-10-CM | POA: Insufficient documentation

## 2019-07-05 NOTE — Progress Notes (Signed)
   Covid-19 Vaccination Clinic  Name:  RUSSELL ENGELSTAD    MRN: [id number] DOB: 1980/07/23  07/05/2019  Ms. Mcelhiney was observed post Covid-19 immunization for 15 minutes without incident. She was provided with Vaccine Information Sheet and instruction to access the V-Safe system.   Ms. Olejnik was instructed to call 911 with any severe reactions post vaccine: Marland Kitchen Difficulty breathing  . Swelling of face and throat  . A fast heartbeat  . A bad rash all over body  . Dizziness and weakness   Immunizations Administered    Name Date Dose VIS Date Route   Pfizer COVID-19 Vaccine 07/05/2019 11:40 AM 0.3 mL 04/11/2019 Intramuscular   Manufacturer: [employer], [employer]   Lot: [id number]   NDC: [address]-3

## 2019-07-12 DIAGNOSIS — F4321 Adjustment disorder with depressed mood: Secondary | ICD-10-CM | POA: Diagnosis not present

## 2019-07-14 DIAGNOSIS — F4320 Adjustment disorder, unspecified: Secondary | ICD-10-CM | POA: Diagnosis not present

## 2019-07-19 DIAGNOSIS — F4321 Adjustment disorder with depressed mood: Secondary | ICD-10-CM | POA: Diagnosis not present

## 2019-07-26 ENCOUNTER — Ambulatory Visit: Payer: Self-pay | Attending: Internal Medicine

## 2019-07-26 DIAGNOSIS — Z23 Encounter for immunization: Secondary | ICD-10-CM

## 2019-07-26 NOTE — Progress Notes (Signed)
   Covid-19 Vaccination Clinic  Name:  Tara Kelley    MRN: [id number] DOB: 24-Apr-1981  07/26/2019  Ms. [name] was observed post Covid-19 immunization for 15 minutes without incident. She was provided with Vaccine Information Sheet and instruction to access the V-Safe system.   Ms. [name] was instructed to call 911 with any severe reactions post vaccine: Marland Kitchen Difficulty breathing  . Swelling of face and throat  . A fast heartbeat  . A bad rash all over body  . Dizziness and weakness   Immunizations Administered    Name Date Dose VIS Date Route   Pfizer COVID-19 Vaccine 07/26/2019  1:00 PM 0.3 mL 04/11/2019 Intramuscular   Manufacturer: [employer], [employer]   Lot: [id number]   NDC: [address]-3

## 2019-07-30 DIAGNOSIS — F4320 Adjustment disorder, unspecified: Secondary | ICD-10-CM | POA: Diagnosis not present

## 2019-08-08 DIAGNOSIS — Z713 Dietary counseling and surveillance: Secondary | ICD-10-CM | POA: Diagnosis not present

## 2019-08-08 DIAGNOSIS — F4320 Adjustment disorder, unspecified: Secondary | ICD-10-CM | POA: Diagnosis not present

## 2019-08-16 DIAGNOSIS — F4321 Adjustment disorder with depressed mood: Secondary | ICD-10-CM | POA: Diagnosis not present

## 2019-08-21 DIAGNOSIS — F4320 Adjustment disorder, unspecified: Secondary | ICD-10-CM | POA: Diagnosis not present

## 2019-08-31 DIAGNOSIS — Z20828 Contact with and (suspected) exposure to other viral communicable diseases: Secondary | ICD-10-CM | POA: Diagnosis not present

## 2019-08-31 DIAGNOSIS — Z03818 Encounter for observation for suspected exposure to other biological agents ruled out: Secondary | ICD-10-CM | POA: Diagnosis not present

## 2019-08-31 DIAGNOSIS — I10 Essential (primary) hypertension: Secondary | ICD-10-CM | POA: Diagnosis not present

## 2019-09-01 DIAGNOSIS — F4320 Adjustment disorder, unspecified: Secondary | ICD-10-CM | POA: Diagnosis not present

## 2019-09-20 DIAGNOSIS — F4321 Adjustment disorder with depressed mood: Secondary | ICD-10-CM | POA: Diagnosis not present

## 2019-10-01 DIAGNOSIS — S29091A Other injury of muscle and tendon of front wall of thorax, initial encounter: Secondary | ICD-10-CM | POA: Diagnosis not present

## 2019-10-01 DIAGNOSIS — S3991XA Unspecified injury of abdomen, initial encounter: Secondary | ICD-10-CM | POA: Diagnosis not present

## 2019-10-01 DIAGNOSIS — S299XXA Unspecified injury of thorax, initial encounter: Secondary | ICD-10-CM | POA: Diagnosis not present

## 2019-10-01 DIAGNOSIS — S29012A Strain of muscle and tendon of back wall of thorax, initial encounter: Secondary | ICD-10-CM | POA: Diagnosis not present

## 2019-10-01 DIAGNOSIS — Z79899 Other long term (current) drug therapy: Secondary | ICD-10-CM | POA: Diagnosis not present

## 2019-10-01 DIAGNOSIS — Z6841 Body Mass Index (BMI) 40.0 and over, adult: Secondary | ICD-10-CM | POA: Diagnosis not present

## 2019-10-01 DIAGNOSIS — E669 Obesity, unspecified: Secondary | ICD-10-CM | POA: Diagnosis not present

## 2019-10-01 DIAGNOSIS — S29011A Strain of muscle and tendon of front wall of thorax, initial encounter: Secondary | ICD-10-CM | POA: Diagnosis not present

## 2019-10-01 DIAGNOSIS — S39012A Strain of muscle, fascia and tendon of lower back, initial encounter: Secondary | ICD-10-CM | POA: Diagnosis not present

## 2019-10-01 DIAGNOSIS — M542 Cervicalgia: Secondary | ICD-10-CM | POA: Diagnosis not present

## 2019-10-01 DIAGNOSIS — I10 Essential (primary) hypertension: Secondary | ICD-10-CM | POA: Diagnosis not present

## 2019-10-01 DIAGNOSIS — R079 Chest pain, unspecified: Secondary | ICD-10-CM | POA: Diagnosis not present

## 2019-10-08 DIAGNOSIS — M542 Cervicalgia: Secondary | ICD-10-CM | POA: Diagnosis not present

## 2019-10-08 DIAGNOSIS — M549 Dorsalgia, unspecified: Secondary | ICD-10-CM | POA: Diagnosis not present

## 2019-10-09 DIAGNOSIS — F4321 Adjustment disorder with depressed mood: Secondary | ICD-10-CM | POA: Diagnosis not present

## 2019-10-28 DIAGNOSIS — F4321 Adjustment disorder with depressed mood: Secondary | ICD-10-CM | POA: Diagnosis not present

## 2019-11-07 DIAGNOSIS — F410 Panic disorder [episodic paroxysmal anxiety] without agoraphobia: Secondary | ICD-10-CM | POA: Diagnosis not present

## 2019-11-07 DIAGNOSIS — F419 Anxiety disorder, unspecified: Secondary | ICD-10-CM | POA: Diagnosis not present

## 2019-11-17 DIAGNOSIS — F4321 Adjustment disorder with depressed mood: Secondary | ICD-10-CM | POA: Diagnosis not present

## 2019-12-08 DIAGNOSIS — F4321 Adjustment disorder with depressed mood: Secondary | ICD-10-CM | POA: Diagnosis not present

## 2019-12-27 DIAGNOSIS — F4321 Adjustment disorder with depressed mood: Secondary | ICD-10-CM | POA: Diagnosis not present

## 2019-12-31 DIAGNOSIS — F419 Anxiety disorder, unspecified: Secondary | ICD-10-CM | POA: Insufficient documentation

## 2020-01-05 DIAGNOSIS — F4320 Adjustment disorder, unspecified: Secondary | ICD-10-CM | POA: Diagnosis not present

## 2020-01-07 DIAGNOSIS — L820 Inflamed seborrheic keratosis: Secondary | ICD-10-CM | POA: Diagnosis not present

## 2020-01-07 DIAGNOSIS — D2239 Melanocytic nevi of other parts of face: Secondary | ICD-10-CM | POA: Diagnosis not present

## 2020-01-07 DIAGNOSIS — B078 Other viral warts: Secondary | ICD-10-CM | POA: Diagnosis not present

## 2020-01-12 DIAGNOSIS — F4320 Adjustment disorder, unspecified: Secondary | ICD-10-CM | POA: Diagnosis not present

## 2020-01-19 DIAGNOSIS — F4320 Adjustment disorder, unspecified: Secondary | ICD-10-CM | POA: Diagnosis not present

## 2020-02-07 DIAGNOSIS — F4321 Adjustment disorder with depressed mood: Secondary | ICD-10-CM | POA: Diagnosis not present

## 2020-02-10 DIAGNOSIS — F4320 Adjustment disorder, unspecified: Secondary | ICD-10-CM | POA: Diagnosis not present

## 2020-05-15 DIAGNOSIS — F4321 Adjustment disorder with depressed mood: Secondary | ICD-10-CM | POA: Diagnosis not present

## 2020-05-28 DIAGNOSIS — F4321 Adjustment disorder with depressed mood: Secondary | ICD-10-CM | POA: Diagnosis not present

## 2020-06-18 DIAGNOSIS — F4321 Adjustment disorder with depressed mood: Secondary | ICD-10-CM | POA: Diagnosis not present

## 2020-07-03 DIAGNOSIS — F4321 Adjustment disorder with depressed mood: Secondary | ICD-10-CM | POA: Diagnosis not present

## 2020-07-16 DIAGNOSIS — F4322 Adjustment disorder with anxiety: Secondary | ICD-10-CM | POA: Diagnosis not present

## 2020-07-29 DIAGNOSIS — F4321 Adjustment disorder with depressed mood: Secondary | ICD-10-CM | POA: Diagnosis not present

## 2020-08-20 DIAGNOSIS — F4321 Adjustment disorder with depressed mood: Secondary | ICD-10-CM | POA: Diagnosis not present

## 2020-08-29 DIAGNOSIS — U071 COVID-19: Secondary | ICD-10-CM | POA: Diagnosis not present

## 2020-08-29 DIAGNOSIS — J3489 Other specified disorders of nose and nasal sinuses: Secondary | ICD-10-CM | POA: Diagnosis not present

## 2020-08-29 DIAGNOSIS — Z20822 Contact with and (suspected) exposure to covid-19: Secondary | ICD-10-CM | POA: Diagnosis not present

## 2020-08-29 DIAGNOSIS — J069 Acute upper respiratory infection, unspecified: Secondary | ICD-10-CM | POA: Diagnosis not present

## 2020-08-29 DIAGNOSIS — R059 Cough, unspecified: Secondary | ICD-10-CM | POA: Diagnosis not present

## 2020-09-09 DIAGNOSIS — F4321 Adjustment disorder with depressed mood: Secondary | ICD-10-CM | POA: Diagnosis not present

## 2020-10-02 DIAGNOSIS — F4321 Adjustment disorder with depressed mood: Secondary | ICD-10-CM | POA: Diagnosis not present

## 2020-10-30 DIAGNOSIS — F4321 Adjustment disorder with depressed mood: Secondary | ICD-10-CM | POA: Diagnosis not present

## 2020-11-13 DIAGNOSIS — F4321 Adjustment disorder with depressed mood: Secondary | ICD-10-CM | POA: Diagnosis not present

## 2020-11-20 DIAGNOSIS — Z20822 Contact with and (suspected) exposure to covid-19: Secondary | ICD-10-CM | POA: Diagnosis not present

## 2020-12-04 DIAGNOSIS — F4321 Adjustment disorder with depressed mood: Secondary | ICD-10-CM | POA: Diagnosis not present

## 2021-03-10 DIAGNOSIS — F4321 Adjustment disorder with depressed mood: Secondary | ICD-10-CM | POA: Diagnosis not present

## 2021-03-11 DIAGNOSIS — G4719 Other hypersomnia: Secondary | ICD-10-CM | POA: Diagnosis not present

## 2021-03-11 DIAGNOSIS — Z23 Encounter for immunization: Secondary | ICD-10-CM | POA: Diagnosis not present

## 2021-03-11 DIAGNOSIS — R635 Abnormal weight gain: Secondary | ICD-10-CM | POA: Diagnosis not present

## 2021-03-11 DIAGNOSIS — I10 Essential (primary) hypertension: Secondary | ICD-10-CM | POA: Diagnosis not present

## 2021-03-11 DIAGNOSIS — E785 Hyperlipidemia, unspecified: Secondary | ICD-10-CM | POA: Diagnosis not present

## 2021-03-11 DIAGNOSIS — F419 Anxiety disorder, unspecified: Secondary | ICD-10-CM | POA: Diagnosis not present

## 2021-03-25 DIAGNOSIS — F4321 Adjustment disorder with depressed mood: Secondary | ICD-10-CM | POA: Diagnosis not present

## 2021-03-30 DIAGNOSIS — G4733 Obstructive sleep apnea (adult) (pediatric): Secondary | ICD-10-CM | POA: Diagnosis not present

## 2021-04-20 DIAGNOSIS — Z20822 Contact with and (suspected) exposure to covid-19: Secondary | ICD-10-CM | POA: Diagnosis not present

## 2021-05-13 DIAGNOSIS — F4321 Adjustment disorder with depressed mood: Secondary | ICD-10-CM | POA: Diagnosis not present

## 2021-05-25 DIAGNOSIS — F411 Generalized anxiety disorder: Secondary | ICD-10-CM | POA: Diagnosis not present

## 2021-05-26 DIAGNOSIS — F4321 Adjustment disorder with depressed mood: Secondary | ICD-10-CM | POA: Diagnosis not present

## 2021-06-02 DIAGNOSIS — F411 Generalized anxiety disorder: Secondary | ICD-10-CM | POA: Diagnosis not present

## 2021-06-13 DIAGNOSIS — Z0289 Encounter for other administrative examinations: Secondary | ICD-10-CM

## 2021-06-13 DIAGNOSIS — Z0000 Encounter for general adult medical examination without abnormal findings: Secondary | ICD-10-CM | POA: Diagnosis not present

## 2021-06-15 ENCOUNTER — Encounter (INDEPENDENT_AMBULATORY_CARE_PROVIDER_SITE_OTHER): Payer: Self-pay | Admitting: Family Medicine

## 2021-06-15 ENCOUNTER — Other Ambulatory Visit: Payer: Self-pay

## 2021-06-15 ENCOUNTER — Ambulatory Visit (INDEPENDENT_AMBULATORY_CARE_PROVIDER_SITE_OTHER): Payer: BC Managed Care – PPO | Admitting: Family Medicine

## 2021-06-15 VITALS — BP 132/88 | HR 89 | Temp 98.5°F | Ht 62.0 in | Wt 311.0 lb

## 2021-06-15 DIAGNOSIS — R5383 Other fatigue: Secondary | ICD-10-CM | POA: Diagnosis not present

## 2021-06-15 DIAGNOSIS — Z9189 Other specified personal risk factors, not elsewhere classified: Secondary | ICD-10-CM

## 2021-06-15 DIAGNOSIS — Z1331 Encounter for screening for depression: Secondary | ICD-10-CM | POA: Diagnosis not present

## 2021-06-15 DIAGNOSIS — E669 Obesity, unspecified: Secondary | ICD-10-CM | POA: Diagnosis not present

## 2021-06-15 DIAGNOSIS — E7849 Other hyperlipidemia: Secondary | ICD-10-CM | POA: Diagnosis not present

## 2021-06-15 DIAGNOSIS — E782 Mixed hyperlipidemia: Secondary | ICD-10-CM | POA: Diagnosis not present

## 2021-06-15 DIAGNOSIS — Z6841 Body Mass Index (BMI) 40.0 and over, adult: Secondary | ICD-10-CM

## 2021-06-15 DIAGNOSIS — I10 Essential (primary) hypertension: Secondary | ICD-10-CM | POA: Diagnosis not present

## 2021-06-15 DIAGNOSIS — R0602 Shortness of breath: Secondary | ICD-10-CM | POA: Diagnosis not present
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
## 2021-06-15 NOTE — Progress Notes (Signed)
Chief Complaint:   Tara Kelley (MR# [id number]) is a 41 y.o. female who presents for evaluation and treatment of Tara and related comorbidities. Current BMI is Body mass index is 56.88 kg/m. Tara Kelley has been struggling with her weight for many years and has been unsuccessful in either losing weight, maintaining weight loss, or reaching her healthy weight goal.  Tara Kelley was given a sample of Mounjaro, and she felt it was helpful for weight loss, but her insurance will not cover it.  Tara Kelley is currently in the action stage of change and ready to dedicate time achieving and maintaining a healthier weight. Tara Kelley is interested in becoming our patient and working on intensive lifestyle modifications including (but not limited to) diet and exercise for weight loss.  Tara Kelley's habits were reviewed today and are as follows: Her family eats meals together, she thinks her family will eat healthier with her, she struggles with family and or coworkers weight loss sabotage, her desired weight loss is 161 lbs, she has been heavy most of her life, she started gaining weight during her marriage 2009-2013, her heaviest weight ever was 315 pounds, she has significant food cravings issues, she snacks frequently in the evenings, she skips meals frequently, she is frequently drinking liquids with calories, she frequently makes poor food choices, she frequently eats larger portions than normal, and she struggles with emotional eating.  Depression Screen Tara Kelley's Food and Mood (modified PHQ-9) score was 25.  Depression screen [facility] 2/9 06/15/2021  Decreased Interest 3  Down, Depressed, Hopeless 3  PHQ - 2 Score 6  Altered sleeping 3  Tired, decreased energy 3  Change in appetite 3  Feeling bad or failure about yourself  3  Trouble concentrating 3  Moving slowly or fidgety/restless 3  Suicidal thoughts 1  PHQ-9 Score 25  Difficult doing work/chores Extremely dIfficult   Subjective:   1.  Other fatigue Tara Kelley admits to daytime somnolence and admits to waking up still tired. Patient has a history of symptoms of daytime fatigue and morning fatigue. Tara Kelley generally gets 6 or 7 hours of sleep per night, and states that she has nightime awakenings. Snoring is present. Apneic episodes are present. Epworth Sleepiness Score is 17.   2. SOB (shortness of breath) on exertion Tara Kelley notes increasing shortness of breath with exercising and seems to be worsening over time with weight gain. She notes getting out of breath sooner with activity than she used to. This has not gotten worse recently. Tara Kelley denies shortness of breath at rest or orthopnea.  3. Essential hypertension Tara Kelley's blood pressure is stable on her medications, and she is working on weight loss.  4. Other hyperlipidemia Tara Kelley is not on statin, and she is working on diet and weight loss.  5. At risk for heart disease Tara Kelley is at higher than average risk for cardiovascular disease due to Tara.  Assessment/Plan:   1. Other fatigue Tara Kelley does feel that her weight is causing her energy to be lower than it should be. Fatigue may be related to Tara, depression or many other causes. Labs will be ordered, and in the meanwhile, Tara Kelley will focus on self care including making healthy food choices, increasing physical activity and focusing on stress reduction.  - CBC with Differential/Platelet - EKG 12-Lead - Folate - Hemoglobin A1c - Insulin, random - T3, free - T4, free - TSH - Vitamin B12 - VITAMIN D 25 Hydroxy (Vit-D Deficiency, Fractures)  2. SOB (shortness of breath) on  exertion Tara Kelley does feel that she gets out of breath more easily that she used to when she exercises. Tara Kelley's shortness of breath appears to be Tara related and exercise induced. She has agreed to work on weight loss and gradually increase exercise to treat her exercise induced shortness of breath. Will continue to monitor closely.  3.  Essential hypertension We will check labs today. Tara Kelley will start her Category 4 meal plan. We will watch for signs of hypotension as she continues her lifestyle modifications.  - Comprehensive metabolic panel  4. Other hyperlipidemia Cardiovascular risk and specific lipid/LDL goals reviewed. We discussed several lifestyle modifications today. We will check labs today. Tara Kelley will start her Category 4 eating plan, exercise, and weight loss efforts. Orders and follow up as documented in patient record.   - Lipid panel  5. Screening for depression Tara Kelley had a positive depression screening. Depression is commonly associated with Tara and often results in emotional eating behaviors. We will monitor this closely and work on CBT to help improve the non-hunger eating patterns. Referral to Psychology may be required if no improvement is seen as she continues in our clinic.  6. At risk for heart disease Tara Kelley was given approximately 30 minutes of coronary artery disease prevention counseling today. She is 41 y.o. female and has risk factors for heart disease including Tara. We discussed intensive lifestyle modifications today with an emphasis on specific weight loss instructions and strategies.  Repetitive spaced learning was employed today to elicit superior memory formation and behavioral change.   7. Tara with BMI of 57.0 Tara Kelley is currently in the action stage of change and her goal is to continue with weight loss efforts. I recommend Tara Kelley begin the structured treatment plan as follows:  She has agreed to the Category 4 Plan.  Exercise goals: No exercise has been prescribed for now, while we concentrate on nutritional changes.   Behavioral modification strategies: increasing lean protein intake and meal planning and cooking strategies.  She was informed of the importance of frequent follow-up visits to maximize her success with intensive lifestyle modifications for her multiple  health conditions. She was informed we would discuss her lab results at her next visit unless there is a critical issue that needs to be addressed sooner. Tara Kelley agreed to keep her next visit at the agreed upon time to discuss these results.  Objective:   Blood pressure 132/88, pulse 89, temperature 98.5 F (36.9 C), temperature source Oral, height 5\' 2"  (1.575 m), weight (!) 311 lb (141.1 kg), last menstrual period 05/25/2021, SpO2 99 %. Body mass index is 56.88 kg/m.  EKG: Normal sinus rhythm, rate 87 BPM.  Indirect Calorimeter completed today shows a VO2 of 328 and a REE of 2261.  Her calculated basal metabolic rate is 05/27/2021 thus her basal metabolic rate is better than expected.  General: Cooperative, alert, well developed, in no acute distress. HEENT: Conjunctivae and lids unremarkable. Cardiovascular: Regular rhythm.  Lungs: Normal work of breathing. Neurologic: No focal deficits.   Lab Results  Component Value Date   CREATININE 0.70 07/20/2014   BUN 14 07/20/2014   NA 137 07/20/2014   K 3.6 07/20/2014   CL 101 07/20/2014   CO2 27 07/20/2014   Lab Results  Component Value Date   ALT 17 07/20/2014   AST 19 07/20/2014   ALKPHOS 80 07/20/2014   BILITOT 0.4 07/20/2014   No results found for: HGBA1C No results found for: INSULIN No results found for: TSH No results  found for: CHOL, HDL, LDLCALC, LDLDIRECT, TRIG, CHOLHDL Lab Results  Component Value Date   WBC 16.1 (H) 07/20/2014   HGB 14.3 07/20/2014   HCT 43.2 07/20/2014   MCV 92.1 07/20/2014   PLT 374 07/20/2014   No results found for: IRON, TIBC, FERRITIN  Attestation Statements:   Reviewed by clinician on day of visit: allergies, medications, problem list, medical history, surgical history, family history, social history, and previous encounter notes.   I, Burt Knack, am acting as transcriptionist for Quillian Quince, MD.  I have reviewed the above documentation for accuracy and completeness, and I agree  with the above. - Quillian Quince, MD

## 2021-06-16 ENCOUNTER — Encounter (INDEPENDENT_AMBULATORY_CARE_PROVIDER_SITE_OTHER): Payer: Self-pay | Admitting: Family Medicine

## 2021-06-16 DIAGNOSIS — F411 Generalized anxiety disorder: Secondary | ICD-10-CM | POA: Diagnosis not present

## 2021-06-16 LAB — CBC WITH DIFFERENTIAL/PLATELET
Basophils Absolute: 0.1 10*3/uL (ref 0.0–0.2)
Basos: 1 %
EOS (ABSOLUTE): 0.5 10*3/uL — ABNORMAL HIGH (ref 0.0–0.4)
Eos: 6 %
Hematocrit: 43.7 % (ref 34.0–46.6)
Hemoglobin: 14.3 g/dL (ref 11.1–15.9)
Immature Grans (Abs): 0 10*3/uL (ref 0.0–0.1)
Immature Granulocytes: 0 %
Lymphocytes Absolute: 2.5 10*3/uL (ref 0.7–3.1)
Lymphs: 29 %
MCH: 30 pg (ref 26.6–33.0)
MCHC: 32.7 g/dL (ref 31.5–35.7)
MCV: 92 fL (ref 79–97)
Monocytes Absolute: 0.6 10*3/uL (ref 0.1–0.9)
Monocytes: 7 %
Neutrophils Absolute: 4.8 10*3/uL (ref 1.4–7.0)
Neutrophils: 57 %
Platelets: 332 10*3/uL (ref 150–450)
RBC: 4.76 x10E6/uL (ref 3.77–5.28)
RDW: 12.6 % (ref 11.7–15.4)
WBC: 8.5 10*3/uL (ref 3.4–10.8)

## 2021-06-16 LAB — TSH: TSH: 1.27 u[IU]/mL (ref 0.450–4.500)

## 2021-06-16 LAB — LIPID PANEL
Chol/HDL Ratio: 4.4 ratio (ref 0.0–4.4)
Cholesterol, Total: 208 mg/dL — ABNORMAL HIGH (ref 100–199)
HDL: 47 mg/dL (ref >39)
LDL Chol Calc (NIH): 132 mg/dL — ABNORMAL HIGH (ref 0–99)
Triglycerides: 161 mg/dL — ABNORMAL HIGH (ref 0–149)
VLDL Cholesterol Cal: 29 mg/dL (ref 5–40)

## 2021-06-16 LAB — INSULIN, RANDOM: INSULIN: 20.6 u[IU]/mL (ref 2.6–24.9)

## 2021-06-16 LAB — COMPREHENSIVE METABOLIC PANEL
ALT: 21 IU/L (ref 0–32)
AST: 22 IU/L (ref 0–40)
Albumin/Globulin Ratio: 1.5 (ref 1.2–2.2)
Albumin: 4 g/dL (ref 3.8–4.8)
Alkaline Phosphatase: 109 IU/L (ref 44–121)
BUN/Creatinine Ratio: 12 (ref 9–23)
BUN: 10 mg/dL (ref 6–24)
Bilirubin Total: 0.4 mg/dL (ref 0.0–1.2)
CO2: 22 mmol/L (ref 20–29)
Calcium: 9.1 mg/dL (ref 8.7–10.2)
Chloride: 99 mmol/L (ref 96–106)
Creatinine, Ser: 0.81 mg/dL (ref 0.57–1.00)
Globulin, Total: 2.7 g/dL (ref 1.5–4.5)
Glucose: 76 mg/dL (ref 70–99)
Potassium: 4.2 mmol/L (ref 3.5–5.2)
Sodium: 137 mmol/L (ref 134–144)
Total Protein: 6.7 g/dL (ref 6.0–8.5)
eGFR: 94 mL/min/{1.73_m2} (ref >59)

## 2021-06-16 LAB — HEMOGLOBIN A1C
Est. average glucose Bld gHb Est-mCnc: 108 mg/dL
Hgb A1c MFr Bld: 5.4 % (ref 4.8–5.6)

## 2021-06-16 LAB — VITAMIN B12: Vitamin B-12: 432 pg/mL (ref 232–1245)

## 2021-06-16 LAB — FOLATE: Folate: 6.2 ng/mL (ref >3.0)

## 2021-06-16 LAB — VITAMIN D 25 HYDROXY (VIT D DEFICIENCY, FRACTURES): Vit D, 25-Hydroxy: 18.9 ng/mL — ABNORMAL LOW (ref 30.0–100.0)

## 2021-06-16 LAB — T4, FREE: Free T4: 0.98 ng/dL (ref 0.82–1.77)

## 2021-06-16 LAB — T3, FREE: T3, Free: 3.5 pg/mL (ref 2.0–4.4)

## 2021-06-16 NOTE — Telephone Encounter (Signed)
Please see message and advise.  Thank you. ° °

## 2021-06-16 NOTE — Telephone Encounter (Signed)
Dr.Beasley 

## 2021-06-20 ENCOUNTER — Telehealth (INDEPENDENT_AMBULATORY_CARE_PROVIDER_SITE_OTHER): Payer: BC Managed Care – PPO | Admitting: Psychology

## 2021-06-20 DIAGNOSIS — F3289 Other specified depressive episodes: Secondary | ICD-10-CM | POA: Diagnosis not present

## 2021-06-21 NOTE — Progress Notes (Unsigned)
?  Office: [phone]  /  Fax: [phone] ? ? ? ?Date: July 05, 2021   ?Appointment Start Time: *** ?Duration: *** minutes ?Provider: Lawerance Cruel, Psy.D. ?Type of Session: Individual Therapy  ?Location of Patient: {gbptloc:23249} ?Location of Provider: Provider's Home (private office) ?Type of Contact: Telepsychological Visit via MyChart Video Visit ? ?Session Content: Tara Kelley is a 40 y.o. female presenting for a follow-up appointment to address the previously established treatment goal of increasing coping skills.Today's appointment was a telepsychological visit due to COVID-19. [name] provided verbal consent for today's telepsychological appointment and she is aware she is responsible for securing confidentiality on her end of the session. Prior to proceeding with today's appointment, [name]'s physical location at the time of this appointment was obtained as well a phone number she could be reached at in the event of technical difficulties. Tara Kelley and this provider participated in today's telepsychological service.  ? ?This provider conducted a brief check-in. *** [name] was receptive to today's appointment as evidenced by openness to sharing, responsiveness to feedback, and {gbreceptiveness:23401}. ? ?Mental Status Examination:  ?Appearance: {Appearance:22431} ?Behavior: {Behavior:22445} ?Mood: {gbmood:21757} ?Affect: {Affect:22436} ?Speech: {Speech:22432} ?Eye Contact: {Eye Contact:22433} ?Psychomotor Activity: {Motor Activity:22434} ?Gait: {gbgait:23404} ?Thought Process: {thought process:22448}  ?Thought Content/Perception: {disturbances:22451} ?Orientation: {Orientation:22437} ?Memory/Concentration: {gbcognition:22449} ?Insight: {Insight:22446} ?Judgment: {Insight:22446} ? ?Interventions:  ?{Interventions for Progress Notes:23405} ? ?DSM-5 Diagnosis(es): F32.89 Other Specified Depressive Disorder, Emotional and Binge Eating Behaviors ? ?Treatment Goal & Progress: During the initial appointment with this  provider, the following treatment goal was established: increase coping skills. [name] has demonstrated progress in her goal as evidenced by {gbtxprogress:22839}. [name] also {gbtxprogress2:22951}. ? ?Plan: The next appointment will be scheduled in {gbweeks:21758}, which will be {gbtxmodality:23402}. The next session will focus on {Plan for Next Appointment:23400}. ? ?

## 2021-06-23 ENCOUNTER — Encounter (INDEPENDENT_AMBULATORY_CARE_PROVIDER_SITE_OTHER): Payer: Self-pay | Admitting: Family Medicine

## 2021-06-29 ENCOUNTER — Ambulatory Visit (INDEPENDENT_AMBULATORY_CARE_PROVIDER_SITE_OTHER): Payer: BC Managed Care – PPO | Admitting: Family Medicine

## 2021-06-29 ENCOUNTER — Other Ambulatory Visit: Payer: Self-pay

## 2021-06-29 ENCOUNTER — Encounter (INDEPENDENT_AMBULATORY_CARE_PROVIDER_SITE_OTHER): Payer: Self-pay | Admitting: Family Medicine

## 2021-06-29 VITALS — BP 136/89 | HR 90 | Temp 98.2°F | Ht 62.0 in | Wt 302.0 lb

## 2021-06-29 DIAGNOSIS — E669 Obesity, unspecified: Secondary | ICD-10-CM

## 2021-06-29 DIAGNOSIS — E8881 Metabolic syndrome: Secondary | ICD-10-CM

## 2021-06-29 DIAGNOSIS — E7849 Other hyperlipidemia: Secondary | ICD-10-CM

## 2021-06-29 DIAGNOSIS — E782 Mixed hyperlipidemia: Secondary | ICD-10-CM | POA: Diagnosis not present

## 2021-06-29 DIAGNOSIS — F3289 Other specified depressive episodes: Secondary | ICD-10-CM

## 2021-06-29 DIAGNOSIS — Z6841 Body Mass Index (BMI) 40.0 and over, adult: Secondary | ICD-10-CM

## 2021-06-29 DIAGNOSIS — E559 Vitamin D deficiency, unspecified: Secondary | ICD-10-CM | POA: Diagnosis not present

## 2021-06-29 DIAGNOSIS — Z9189 Other specified personal risk factors, not elsewhere classified: Secondary | ICD-10-CM

## 2021-06-29 MED ORDER — BUPROPION HCL ER (SR) 150 MG PO TB12: 150.0000 mg | ORAL_TABLET | Freq: Every day | ORAL | 0 refills | Status: DC

## 2021-06-29 MED ORDER — VITAMIN D (ERGOCALCIFEROL) 1.25 MG (50000 UNIT) PO CAPS: 50000.0000 [IU] | ORAL_CAPSULE | ORAL | 0 refills | Status: DC

## 2021-06-30 NOTE — Progress Notes (Signed)
? ? ? ?Chief Complaint:  ? ?OBESITY ?Chalet is here to discuss her progress with her obesity treatment plan along with follow-up of her obesity related diagnoses. Aariel is on the Category 4 Plan and states she is following her eating plan approximately 95% of the time. Roschelle states she is doing 0 minutes 0 times per week. ? ?Today's visit was #: 2 ?Starting weight: 311 lbs ?Starting date: 06/15/2021 ?Today's weight: 302 lbs ?Today's date: 06/29/2021 ?Total lbs lost to date: 9 ?Total lbs lost since last in-office visit: 9 ? ?Interim History: Christyana did well with weight loss. She struggled with some GI upset. She felt very deprived. ? ?Subjective:  ? ?1. Mixed hyperlipidemia ?Quanna has a new diagnosis of mixed hyperlipidemia. Her LDL and triglycerides are elevated. She is not on statin, and she is working on her diet and exercise. I discussed labs with the patient today. ? ?2. Vitamin D deficiency ?Carigan has a new diagnosis of Vit D deficiency. She is not on Vitamin D, and her level is very low. She notes fatigue. I discussed labs with the patient today. ? ?3. Insulin resistance ?Joniah has a new diagnosis of insulin resistance. Her A1c and glucose are within normal limits, but her fasting insulin is elevated at 20. She notes polyphagia, and worse in the PM. I discussed labs with the patient today. ? ?4. Other depression, with emotional eating ?Lyris notes cravings and feeling deprived with decreasing simple carbohydrates.  ? ?5. At risk for diabetes mellitus ?Kaylynn is at higher than average risk for developing diabetes due to her obesity. ? ?Assessment/Plan:  ? ?1. Mixed hyperlipidemia ?Cardiovascular risk and specific lipid/LDL goals reviewed. We discussed several lifestyle modifications today. Antonella will continue to work on diet, exercise and weight loss efforts. We will recheck labs in 3 months. Orders and follow up as documented in patient record.  ? ?2. Vitamin D deficiency ?Low Vitamin D level contributes  to fatigue and are associated with obesity, breast, and colon cancer. Kattia agreed to start prescription Vitamin D 50,000 IU every week with no refills. She will follow-up for routine testing of Vitamin D, at least 2-3 times per year to avoid over-replacement. ? ?- Vitamin D, Ergocalciferol, (DRISDOL) 1.25 MG (50000 UNIT) CAPS capsule; Take 1 capsule (50,000 Units total) by mouth every 7 (seven) days.  Dispense: 4 capsule; Refill: 0 ? ?3. Insulin resistance ?Mettie will continue with diet and exercise, and she was educated on decreasing simple carbohydrates to help decrease the risk of diabetes. May consider medication to treat if needed in the meanwhile. Evonne agreed to follow-up with [place] as directed to closely monitor her progress. ? ?4. Other depression, with emotional eating ?Ratasha agreed to start Wellbutrin SR 150 mg q AM with no refills, and we will follow up at her next visit. Behavior modification techniques were discussed today to help Esperansa deal with her emotional/non-hunger eating behaviors. Orders and follow up as documented in patient record.  ? ?- buPROPion (WELLBUTRIN SR) 150 MG 12 hr tablet; Take 1 tablet (150 mg total) by mouth daily.  Dispense: 30 tablet; Refill: 0 ? ?5. At risk for diabetes mellitus ?Nakari was given approximately 30 minutes of diabetic education and counseling today. We discussed intensive lifestyle modifications today with an emphasis on weight loss as well as increasing exercise and decreasing simple carbohydrates in her diet. We also reviewed medication options with an emphasis on risk versus benefits of those discussed. ? ?Repetitive spaced learning was employed today to elicit  superior memory formation and behavioral change. ? ?6. Obesity with BMI of 55.3 ?Leilanie is currently in the action stage of change. As such, her goal is to continue with weight loss efforts. She has agreed to the Category 4 Plan and keeping a food journal and adhering to recommended goals of 400-600  calories and 45+ grams of protein at supper daily.  ? ?Behavioral modification strategies: increasing lean protein intake. ? ?Keyuna has agreed to follow-up with our clinic in 2 weeks. She was informed of the importance of frequent follow-up visits to maximize her success with intensive lifestyle modifications for her multiple health conditions.  ? ?Objective:  ? ?Blood pressure 136/89, pulse 90, temperature 98.2 ?F (36.8 ?C), height 5\' 2"  (1.575 m), weight (!) 302 lb (137 kg), SpO2 97 %. ?Body mass index is 55.24 kg/m?. ? ?General: Cooperative, alert, well developed, in no acute distress. ?HEENT: Conjunctivae and lids unremarkable. ?Cardiovascular: Regular rhythm.  ?Lungs: Normal work of breathing. ?Neurologic: No focal deficits.  ? ?Lab Results  ?Component Value Date  ? CREATININE 0.81 06/15/2021  ? BUN 10 06/15/2021  ? NA 137 06/15/2021  ? K 4.2 06/15/2021  ? CL 99 06/15/2021  ? CO2 22 06/15/2021  ? ?Lab Results  ?Component Value Date  ? ALT 21 06/15/2021  ? AST 22 06/15/2021  ? ALKPHOS 109 06/15/2021  ? BILITOT 0.4 06/15/2021  ? ?Lab Results  ?Component Value Date  ? HGBA1C 5.4 06/15/2021  ? ?Lab Results  ?Component Value Date  ? INSULIN 20.6 06/15/2021  ? ?Lab Results  ?Component Value Date  ? TSH 1.270 06/15/2021  ? ?Lab Results  ?Component Value Date  ? CHOL 208 (H) 06/15/2021  ? HDL 47 06/15/2021  ? LDLCALC 132 (H) 06/15/2021  ? TRIG 161 (H) 06/15/2021  ? CHOLHDL 4.4 06/15/2021  ? ?Lab Results  ?Component Value Date  ? VD25OH 18.9 (L) 06/15/2021  ? ?Lab Results  ?Component Value Date  ? WBC 8.5 06/15/2021  ? HGB 14.3 06/15/2021  ? HCT 43.7 06/15/2021  ? MCV 92 06/15/2021  ? PLT 332 06/15/2021  ? ?No results found for: IRON, TIBC, FERRITIN ? ?Attestation Statements:  ? ?Reviewed by clinician on day of visit: allergies, medications, problem list, medical history, surgical history, family history, social history, and previous encounter notes. ? ? ?I, 06/17/2021, am acting as transcriptionist for Burt Knack, MD. ? ?I have reviewed the above documentation for accuracy and completeness, and I agree with the above. -  Quillian Quince, MD ? ? ?

## 2021-07-05 ENCOUNTER — Encounter (INDEPENDENT_AMBULATORY_CARE_PROVIDER_SITE_OTHER): Payer: Self-pay

## 2021-07-05 ENCOUNTER — Telehealth (INDEPENDENT_AMBULATORY_CARE_PROVIDER_SITE_OTHER): Payer: BC Managed Care – PPO | Admitting: Psychology

## 2021-07-07 DIAGNOSIS — F411 Generalized anxiety disorder: Secondary | ICD-10-CM | POA: Diagnosis not present

## 2021-07-13 ENCOUNTER — Other Ambulatory Visit: Payer: Self-pay

## 2021-07-13 ENCOUNTER — Ambulatory Visit (INDEPENDENT_AMBULATORY_CARE_PROVIDER_SITE_OTHER): Payer: BC Managed Care – PPO | Admitting: Family Medicine

## 2021-07-13 ENCOUNTER — Encounter (INDEPENDENT_AMBULATORY_CARE_PROVIDER_SITE_OTHER): Payer: Self-pay | Admitting: Family Medicine

## 2021-07-13 VITALS — BP 137/79 | HR 82 | Temp 98.1°F | Ht 62.0 in | Wt 298.0 lb

## 2021-07-13 DIAGNOSIS — Z6841 Body Mass Index (BMI) 40.0 and over, adult: Secondary | ICD-10-CM

## 2021-07-13 DIAGNOSIS — I10 Essential (primary) hypertension: Secondary | ICD-10-CM | POA: Diagnosis not present

## 2021-07-13 DIAGNOSIS — E669 Obesity, unspecified: Secondary | ICD-10-CM | POA: Diagnosis not present

## 2021-07-13 DIAGNOSIS — Z9189 Other specified personal risk factors, not elsewhere classified: Secondary | ICD-10-CM

## 2021-07-13 DIAGNOSIS — E559 Vitamin D deficiency, unspecified: Secondary | ICD-10-CM | POA: Diagnosis not present

## 2021-07-13 DIAGNOSIS — F3289 Other specified depressive episodes: Secondary | ICD-10-CM

## 2021-07-13 MED ORDER — VITAMIN D (ERGOCALCIFEROL) 1.25 MG (50000 UNIT) PO CAPS: 50000.0000 [IU] | ORAL_CAPSULE | ORAL | 0 refills | Status: DC

## 2021-07-14 DIAGNOSIS — F411 Generalized anxiety disorder: Secondary | ICD-10-CM | POA: Diagnosis not present

## 2021-07-18 NOTE — Progress Notes (Signed)
? ? ? ?Chief Complaint:  ? ?OBESITY ?Tara Kelley is here to discuss her progress with her obesity treatment plan along with follow-up of her obesity related diagnoses. Tara Kelley is on the Category 4 Plan and keeping a food journal and adhering to recommended goals of 400-600 calories and 45+ grams of protein at supper daily and states she is following her eating plan approximately 80% of the time. Tara Kelley states she is doing 0 minutes 0 times per week. ? ?Today's visit was #: 3 ?Starting weight: 311 lbs ?Starting date: 06/15/2021 ?Today's weight: 298 lbs ?Today's date: 07/13/2021 ?Total lbs lost to date: 13 ?Total lbs lost since last in-office visit: 4 ? ?Interim History: Tara Kelley continues to do well with weight loss on her plan. Her hunger is mostly controlled, but she is struggling with some cravings. She likes the freedom of journaling for supper. ? ?Subjective:  ? ?1. Essential hypertension ?Tara Kelley continues to do well with diet, exercise, and weight loss. She has no problems with her medications. She denies signs of hypotension. ? ?2. Vitamin D deficiency ?Tara Kelley is stable on Vitamin D, with no side effects noted. ? ?3. Other depression, with emotional eating ?Tara Kelley was started on Wellbutrin approximately 2 weeks ago, but she is still struggling with some emotional eating behaviors.  ? ?4. At risk for heart disease ?Tara Kelley is at higher than average risk for cardiovascular disease due to obesity. ? ?Assessment/Plan:  ? ?1. Essential hypertension ?Denetta will continue with diet, exercise, and weight loss to improve blood pressure control. We will follow as she continues her lifestyle modifications. ? ?2. Vitamin D deficiency ?We will refill prescription Vitamin D for 1 month. Tara Kelley will follow-up for routine testing of Vitamin D, at least 2-3 times per year to avoid over-replacement. ? ?- Vitamin D, Ergocalciferol, (DRISDOL) 1.25 MG (50000 UNIT) CAPS capsule; Take 1 capsule (50,000 Units total) by mouth every 7 (seven)  days.  Dispense: 4 capsule; Refill: 0 ? ?3. Other depression, with emotional eating ?Tara Kelley will continue Wellbutrin at her current dose, may increase at her next visit if needed. Emotional eating behavior strategies were discussed today to help Tara Kelley deal with her emotional/non-hunger eating behaviors. Orders and follow up as documented in patient record.  ? ?4. At risk for heart disease ?Tara Kelley was given approximately 15 minutes of coronary artery disease prevention counseling today. She is 41 y.o. female and has risk factors for heart disease including obesity. We discussed intensive lifestyle modifications today with an emphasis on specific weight loss instructions and strategies. ? ?Repetitive spaced learning was employed today to elicit superior memory formation and behavioral change.  ? ?5. Obesity with BMI of 54.6 ?Tara Kelley is currently in the action stage of change. As such, her goal is to continue with weight loss efforts. She has agreed to the Category 4 Plan and keeping a food journal and adhering to recommended goals of 400-600 calories and 45+ grams of protein at supper daily.  ? ?Tara Kelley was shown additional features of MyFitnessPal to help her track, including savory recipes and meals. ? ?Behavioral modification strategies: increasing lean protein intake and no skipping meals. ? ?Tara Kelley has agreed to follow-up with our clinic in 2 weeks. She was informed of the importance of frequent follow-up visits to maximize her success with intensive lifestyle modifications for her multiple health conditions.  ? ?Objective:  ? ?Blood pressure 137/79, pulse 82, temperature 98.1 ?F (36.7 ?C), height 5\' 2"  (1.575 m), weight 298 lb (135.2 kg), SpO2 97 %. ?Body mass  index is 54.5 kg/m?. ? ?General: Cooperative, alert, well developed, in no acute distress. ?HEENT: Conjunctivae and lids unremarkable. ?Cardiovascular: Regular rhythm.  ?Lungs: Normal work of breathing. ?Neurologic: No focal deficits.  ? ?Lab Results   ?Component Value Date  ? CREATININE 0.81 06/15/2021  ? BUN 10 06/15/2021  ? NA 137 06/15/2021  ? K 4.2 06/15/2021  ? CL 99 06/15/2021  ? CO2 22 06/15/2021  ? ?Lab Results  ?Component Value Date  ? ALT 21 06/15/2021  ? AST 22 06/15/2021  ? ALKPHOS 109 06/15/2021  ? BILITOT 0.4 06/15/2021  ? ?Lab Results  ?Component Value Date  ? HGBA1C 5.4 06/15/2021  ? ?Lab Results  ?Component Value Date  ? INSULIN 20.6 06/15/2021  ? ?Lab Results  ?Component Value Date  ? TSH 1.270 06/15/2021  ? ?Lab Results  ?Component Value Date  ? CHOL 208 (H) 06/15/2021  ? HDL 47 06/15/2021  ? LDLCALC 132 (H) 06/15/2021  ? TRIG 161 (H) 06/15/2021  ? CHOLHDL 4.4 06/15/2021  ? ?Lab Results  ?Component Value Date  ? VD25OH 18.9 (L) 06/15/2021  ? ?Lab Results  ?Component Value Date  ? WBC 8.5 06/15/2021  ? HGB 14.3 06/15/2021  ? HCT 43.7 06/15/2021  ? MCV 92 06/15/2021  ? PLT 332 06/15/2021  ? ?No results found for: IRON, TIBC, FERRITIN ? ?Attestation Statements:  ? ?Reviewed by clinician on day of visit: allergies, medications, problem list, medical history, surgical history, family history, social history, and previous encounter notes. ? ? ?I, Burt Knack, am acting as transcriptionist for Quillian Quince, MD. ? ?I have reviewed the above documentation for accuracy and completeness, and I agree with the above. -  Quillian Quince, MD ? ? ?

## 2021-07-21 ENCOUNTER — Other Ambulatory Visit (INDEPENDENT_AMBULATORY_CARE_PROVIDER_SITE_OTHER): Payer: Self-pay | Admitting: Family Medicine

## 2021-07-21 DIAGNOSIS — F3289 Other specified depressive episodes: Secondary | ICD-10-CM

## 2021-07-22 DIAGNOSIS — F4321 Adjustment disorder with depressed mood: Secondary | ICD-10-CM | POA: Diagnosis not present

## 2021-07-26 NOTE — Progress Notes (Addendum)
?TeleHealth Visit:  ?Due to the COVID-19 pandemic, this visit was completed with telemedicine (audio/video) technology to reduce patient and provider exposure as well as to preserve personal protective equipment.  ? ?Tara Kelley has verbally consented to this TeleHealth visit. The patient is located at home, the provider is located at home. The participants in this visit include the listed provider and patient. The visit was conducted today via MyChart video. ? ?OBESITY ?Tara Kelley is here to discuss her progress with her obesity treatment plan along with follow-up of her obesity related diagnoses.  ? ?Today's visit was # 4 ?Starting weight: 311 lbs ?Starting date: 06/15/2021 ?Total weight loss: 13 lbs at last in office visit. ?Weight at last in office visit: 298 lbs ?Today's reported weight:  No weight reported. ? ?Nutrition Plan: the Category 4 Plan and keeping a food journal and adhering to recommended goals of 400-600 calories and 45 gms of protein at supper. ?Hunger is moderately controlled. Cravings are poorly controlled.  ?Current exercise: none ? ?Interim History: Tara Kelley reports that she did some traveling over the past few weeks which made it more difficult to adhere to plan.  She feels like cravings are her biggest barrier to plan adherence.  She notes a significant amount of stress recently in her personal life which affects her cravings.  She notes some looser stools since starting our plan but she does feel that this has improved somewhat. ?She says she loves to cook and loves variety in her foods so she is enjoying journaling at supper and does also journal calories for the entire day. ? ?Assessment/Plan:  ?Vitamin D Deficiency ?Vitamin D is not at goal of 50. She is on weekly prescription Vitamin D 50,000 IU.  ?Lab Results  ?Component Value Date  ? VD25OH 18.9 (L) 06/15/2021  ? ? ?Plan: ?Refill prescription vitamin D 50,000 IU weekly. ? ?2. Other depression with emotional eating ?She is still  struggling with food cravings.  She is taking bupropion 150 mg daily as well as Celexa 20 mg (prescribed for anxiety). ?She has met with Dr. Mallie Mussel 1 time but then her follow-up was canceled by our office. ? ?Plan: ?Increase dose of bupropion to 150 mg twice a day.   ?Continue Celexa 20 mg daily.   ?We will reschedule her appointment with Dr. Mallie Mussel which was canceled. ? ?3. Obesity: Current BMI 54.49 ?Tara Kelley is currently in the action stage of change. As such, her goal is to continue with weight loss efforts. She has agreed to the Category 4 Plan and/or keeping a food journal and adhering to recommended goals of 1700-1800 calories and 100 gms. protein/daily.  ?the Category 4 Plan and keeping a food journal and adhering to recommended goals of 400-600 calories and 45 gms of protein at supper. ? ?Exercise goals:  Tara Kelley plans to start using her under desk treadmill a few hours per day. ? ?Behavioral modification strategies: increasing lean protein intake, decreasing simple carbohydrates, meal planning and cooking strategies, and better snacking choices. ?Handouts sent via MyChart-protein content of foods, recipes and recipes II. ? ?Tara Kelley has agreed to follow-up with our clinic in 2 weeks.  ? ?No orders of the defined types were placed in this encounter. ? ? ?Medications Discontinued During This Encounter  ?Medication Reason  ? buPROPion (WELLBUTRIN SR) 150 MG 12 hr tablet Reorder  ? Vitamin D, Ergocalciferol, (DRISDOL) 1.25 MG (50000 UNIT) CAPS capsule Reorder  ?  ? ?Meds ordered this encounter  ?Medications  ? Vitamin D, Ergocalciferol, (DRISDOL) 1.25  MG (50000 UNIT) CAPS capsule  ?  Sig: Take 1 capsule (50,000 Units total) by mouth every 7 (seven) days.  ?  Dispense:  4 capsule  ?  Refill:  0  ?  Order Specific Question:   Supervising Provider  ?  Answer:   Dennard Nip D [[id number]]  ? buPROPion (WELLBUTRIN SR) 150 MG 12 hr tablet  ?  Sig: Take 1 tablet (150 mg total) by mouth 2 (two) times daily.  ?  Dispense:   60 tablet  ?  Refill:  0  ?  Order Specific Question:   Supervising Provider  ?  Answer:   Dennard Nip D [[id number]]  ?   ? ?Objective:  ? ?VITALS: Per patient if applicable, see vitals. ?GENERAL: Alert and in no acute distress. ?CARDIOPULMONARY: No increased WOB. Speaking in clear sentences.  ?PSYCH: Pleasant and cooperative. Speech normal rate and rhythm. Affect is appropriate. Insight and judgement are appropriate. Attention is focused, linear, and appropriate.  ?NEURO: Oriented as arrived to appointment on time with no prompting.  ? ?Lab Results  ?Component Value Date  ? CREATININE 0.81 06/15/2021  ? BUN 10 06/15/2021  ? NA 137 06/15/2021  ? K 4.2 06/15/2021  ? CL 99 06/15/2021  ? CO2 22 06/15/2021  ? ?Lab Results  ?Component Value Date  ? ALT 21 06/15/2021  ? AST 22 06/15/2021  ? ALKPHOS 109 06/15/2021  ? BILITOT 0.4 06/15/2021  ? ?Lab Results  ?Component Value Date  ? HGBA1C 5.4 06/15/2021  ? ?Lab Results  ?Component Value Date  ? INSULIN 20.6 06/15/2021  ? ?Lab Results  ?Component Value Date  ? TSH 1.270 06/15/2021  ? ?Lab Results  ?Component Value Date  ? CHOL 208 (H) 06/15/2021  ? HDL 47 06/15/2021  ? LDLCALC 132 (H) 06/15/2021  ? TRIG 161 (H) 06/15/2021  ? CHOLHDL 4.4 06/15/2021  ? ?Lab Results  ?Component Value Date  ? WBC 8.5 06/15/2021  ? HGB 14.3 06/15/2021  ? HCT 43.7 06/15/2021  ? MCV 92 06/15/2021  ? PLT 332 06/15/2021  ? ?No results found for: IRON, TIBC, FERRITIN ?Lab Results  ?Component Value Date  ? VD25OH 18.9 (L) 06/15/2021  ? ? ?Attestation Statements:  ? ?Reviewed by clinician on day of visit: allergies, medications, problem list, medical history, surgical history, family history, social history, and previous encounter notes. ? ? ? ? ? ?

## 2021-07-27 ENCOUNTER — Telehealth (INDEPENDENT_AMBULATORY_CARE_PROVIDER_SITE_OTHER): Payer: BC Managed Care – PPO | Admitting: Family Medicine

## 2021-07-27 ENCOUNTER — Encounter (INDEPENDENT_AMBULATORY_CARE_PROVIDER_SITE_OTHER): Payer: Self-pay | Admitting: Family Medicine

## 2021-07-27 DIAGNOSIS — F3289 Other specified depressive episodes: Secondary | ICD-10-CM

## 2021-07-27 DIAGNOSIS — E669 Obesity, unspecified: Secondary | ICD-10-CM

## 2021-07-27 DIAGNOSIS — Z6841 Body Mass Index (BMI) 40.0 and over, adult: Secondary | ICD-10-CM

## 2021-07-27 DIAGNOSIS — E559 Vitamin D deficiency, unspecified: Secondary | ICD-10-CM | POA: Diagnosis not present

## 2021-07-27 MED ORDER — BUPROPION HCL ER (SR) 150 MG PO TB12: 150.0000 mg | ORAL_TABLET | Freq: Two times a day (BID) | ORAL | 0 refills | Status: DC

## 2021-07-27 MED ORDER — VITAMIN D (ERGOCALCIFEROL) 1.25 MG (50000 UNIT) PO CAPS: 50000.0000 [IU] | ORAL_CAPSULE | ORAL | 0 refills | Status: DC

## 2021-07-28 ENCOUNTER — Ambulatory Visit (INDEPENDENT_AMBULATORY_CARE_PROVIDER_SITE_OTHER): Payer: BC Managed Care – PPO | Admitting: Family Medicine

## 2021-07-28 DIAGNOSIS — F411 Generalized anxiety disorder: Secondary | ICD-10-CM | POA: Diagnosis not present

## 2021-08-01 NOTE — Progress Notes (Signed)
?  Office: [phone]  /  Fax: [phone] ? ? ? ?Date: August 15, 2021   ?Appointment Start Time: 2:31pm ?Duration: 23 minutes ?Provider: Lawerance Cruel, Psy.D. ?Type of Session: Individual Therapy  ?Location of Patient: Home (private location) ?Location of Provider: Provider's Home (private office) ?Type of Contact: Telepsychological Visit via MyChart Video Visit ? ?Session Content: Tara Kelley is a 41 y.o. female presenting for a follow-up appointment to address the previously established treatment goal of increasing coping skills.Today's appointment was a telepsychological visit due to COVID-19. Tara Kelley provided verbal consent for today's telepsychological appointment and she is aware she is responsible for securing confidentiality on her end of the session. Prior to proceeding with today's appointment, Tara Kelley's physical location at the time of this appointment was obtained as well a phone number she could be reached at in the event of technical difficulties. Tara Kelley and this provider participated in today's telepsychological service.  ? ?This provider conducted a brief check-in. Tara Kelley shared she initially "did really well" with her structured meal plan; however, discussed deviations since then. She expressed desire to "get back into it [referring to her structured meal plan]." Further explored and processed. She described feeling overwhelmed with current stressors. Psychoeducation provided regarding self-compassion. Tara Kelley was engaged in a self-compassion exercise to help with eating-related challenges and other ongoing stressors. She was encouraged to regularly ask herself, ?What do I need right now?? Tara Kelley was receptive to today's appointment as evidenced by openness to sharing, responsiveness to feedback, and  willingness to work toward increasing self-compassion . ? ?Mental Status Examination:  ?Appearance: neat ?Behavior: appropriate to circumstances ?Mood: neutral ?Affect: mood congruent ?Speech: WNL ?Eye  Contact: appropriate ?Psychomotor Activity: WNL ?Gait: unable to assess ?Thought Process: linear, logical, and goal directed and denies suicidal, homicidal, and self-harm ideation, plan and intent  ?Thought Content/Perception: no hallucinations, delusions, bizarre thinking or behavior endorsed or observed ?Orientation: AAOx4 ?Memory/Concentration: memory, attention, language, and fund of knowledge intact  ?Insight: fair ?Judgment: fair ? ?Interventions:  ?Conducted a brief chart review ?Conducted a risk assessment ?Provided empathic reflections and validation ?Employed supportive psychotherapy interventions to facilitate reduced distress and to improve coping skills with identified stressors ?Psychoeducation provided regarding self-compassion ?Engaged pt in a self-compassion exercise ? ?DSM-5 Diagnosis(es): F32.89 Other Specified Depressive Disorder, Emotional and Binge Eating Behaviors ? ?Treatment Goal & Progress: During the initial appointment with this provider, the following treatment goal was established: increase coping skills. Progress is limited, as Tara Kelley has just begun treatment with this provider; however, she is receptive to the interaction and interventions and rapport is being established.  ? ?Plan: The next appointment will be scheduled in approximately two weeks, which will be via MyChart Video Visit. The next session will focus on working towards the established treatment goal. ? ?

## 2021-08-02 ENCOUNTER — Ambulatory Visit (INDEPENDENT_AMBULATORY_CARE_PROVIDER_SITE_OTHER): Payer: BC Managed Care – PPO | Admitting: Family Medicine

## 2021-08-04 DIAGNOSIS — F411 Generalized anxiety disorder: Secondary | ICD-10-CM | POA: Diagnosis not present

## 2021-08-08 ENCOUNTER — Encounter (INDEPENDENT_AMBULATORY_CARE_PROVIDER_SITE_OTHER): Payer: Self-pay | Admitting: Family Medicine

## 2021-08-08 ENCOUNTER — Ambulatory Visit (INDEPENDENT_AMBULATORY_CARE_PROVIDER_SITE_OTHER): Payer: BC Managed Care – PPO | Admitting: Family Medicine

## 2021-08-08 VITALS — BP 126/84 | HR 99 | Temp 97.9°F | Ht 62.0 in | Wt 297.0 lb

## 2021-08-08 DIAGNOSIS — E669 Obesity, unspecified: Secondary | ICD-10-CM | POA: Diagnosis not present

## 2021-08-08 DIAGNOSIS — F39 Unspecified mood [affective] disorder: Secondary | ICD-10-CM | POA: Diagnosis not present

## 2021-08-08 DIAGNOSIS — I10 Essential (primary) hypertension: Secondary | ICD-10-CM | POA: Diagnosis not present

## 2021-08-08 DIAGNOSIS — Z9189 Other specified personal risk factors, not elsewhere classified: Secondary | ICD-10-CM

## 2021-08-08 DIAGNOSIS — E559 Vitamin D deficiency, unspecified: Secondary | ICD-10-CM | POA: Diagnosis not present

## 2021-08-08 DIAGNOSIS — Z6841 Body Mass Index (BMI) 40.0 and over, adult: Secondary | ICD-10-CM

## 2021-08-08 MED ORDER — VITAMIN D (ERGOCALCIFEROL) 1.25 MG (50000 UNIT) PO CAPS: 50000.0000 [IU] | ORAL_CAPSULE | ORAL | 0 refills | Status: DC

## 2021-08-08 MED ORDER — BUPROPION HCL ER (SR) 150 MG PO TB12: 150.0000 mg | ORAL_TABLET | Freq: Two times a day (BID) | ORAL | 0 refills | Status: DC

## 2021-08-09 NOTE — Progress Notes (Signed)
? ? ? ?Chief Complaint:  ? ?OBESITY ?Tara Kelley is here to discuss her progress with her obesity treatment plan along with follow-up of her obesity related diagnoses. Tara Kelley is on the Category 4 Plan and keeping a food journal and adhering to recommended goals of 400-600 calories and 45 grams of protein at supper daily and states she is following her eating plan approximately 5% of the time. Tara Kelley states she is walking 8,000 steps 2-3 times per week.  ? ?Today's visit was #: 5 ?Starting weight: 311 lbs ?Starting date: 06/15/2021 ?Today's weight: 297 lbs ?Today's date: 08/08/2021 ?Total lbs lost to date: 14 ?Total lbs lost since last in-office visit: 1 ? ?Interim History: Tara Kelley is a patient of Dr. Francena Hanly. She is emotionally struggling lately, and she is tearful in the office.  ? ?Subjective:  ? ?1. Essential hypertension ?Tara Kelley's blood pressure is at goal. She is taking Cozaar and HCTZ. Cardiovascular ROS: no chest pain or dyspnea on exertion. ? ?BP Readings from Last 3 Encounters:  ?08/08/21 126/84  ?07/13/21 137/79  ?06/29/21 136/89  ? ?2. Vitamin D deficiency ?She is currently taking prescription vitamin D 50,000 IU each week. She denies nausea, vomiting or muscle weakness. ? 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 Little change in mood thus far. She sees [facility] every 2 weeks. Tara Kelley for general counseling and also sees Dr. Dewaine Conger. She is still taking Celexa.  ? ?4. At risk for deficient intake of food ?The patient is at a higher than average risk of deficient intake of food due to taking phentermine. ?  ?Assessment/Plan:  ?No orders of the defined types were placed in this encounter. ? ? ?Medications Discontinued During This Encounter  ?Medication Reason  ? Vitamin D, Ergocalciferol, (DRISDOL) 1.25 MG (50000 UNIT) CAPS capsule Reorder  ? buPROPion (WELLBUTRIN SR) 150 MG 12 hr tablet Reorder  ?   ? ?Meds ordered this encounter  ?Medications  ? buPROPion (WELLBUTRIN SR) 150 MG 12 hr tablet  ?  Sig: Take 1 tablet (150 mg total) by mouth 2 (two) times daily.  ?  Dispense:  60 tablet  ?  Refill:  0  ? Vitamin D, Ergocalciferol, (DRISDOL) 1.25 MG (50000 UNIT) CAPS capsule  ?  Sig: Take 1 capsule (50,000 Units total) by mouth every 7 (seven) days.  ?  Dispense:  4 capsule  ?  Refill:  0  ?  ? ?1. Essential hypertension ?Tara Kelley will continue working on healthy weight loss and exercise to improve blood pressure control. We will watch for signs of hypotension as she continues her lifestyle modifications. ? ?2. Vitamin D deficiency ?We will refill prescription Vitamin D for 1 month. Tara Kelley will follow-up for routine testing of Vitamin D, at least 2-3 times per year to avoid over-replacement. ? ?- Vitamin D, Ergocalciferol, (DRISDOL) 1.25 MG (50000 UNIT) CAPS capsule; Take 1 capsule (50,000 Units total) by mouth every 7 (seven) days.  Dispense: 4 capsule; Refill: 0 ? 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 I explained to the patient Wellbutrin will take up to 6-12 weeks or so to take full affect. She will continue with counseling. Start walking daily with/or without meditation.  ? ?- buPROPion (WELLBUTRIN SR) 150 MG 12 hr tablet; Take 1 tablet (150 mg total) by mouth 2 (two) times daily.  Dispense: 60 tablet; Refill: 0 ? ?4. At risk for deficient  intake of food ?Tara Kelley was given approximately 9 minutes of deficient intake of food prevention counseling today. Tara Kelley is at risk for eating too few calories based on current food recall. She was encouraged to focus on meeting caloric and protein goals according to her recommended meal plan. ? ?5. Obesity with BMI of 54.4 ?Tara Kelley is currently in the action stage of change. As such, her goal is to continue with weight loss efforts. She has agreed to keeping a food journal and adhering to recommended goals of 1700-1800 calories and  120 grams of protein daily.  ? ?Exercise goals: Try to walk for 20-30 minutes daily.  ? ?Behavioral modification strategies: increasing lean protein intake, decreasing simple carbohydrates, and planning for success. ? ?Tara Kelley has agreed to follow-up with our clinic in 3 weeks with Dr. Dalbert Garnet. She was informed of the importance of frequent follow-up visits to maximize her success with intensive lifestyle modifications for her multiple health conditions.  ? ?Objective:  ? ?Blood pressure 126/84, pulse 99, temperature 97.9 ?F (36.6 ?C), height 5\' 2"  (1.575 m), weight 297 lb (134.7 kg), SpO2 95 %. ?Body mass index is 54.32 kg/m?. ? ?General: Cooperative, alert, well developed, in no acute distress. ?HEENT: Conjunctivae and lids unremarkable. ?Cardiovascular: Regular rhythm.  ?Lungs: Normal work of breathing. ?Neurologic: No focal deficits.  ? ?Lab Results  ?Component Value Date  ? CREATININE 0.81 06/15/2021  ? BUN 10 06/15/2021  ? NA 137 06/15/2021  ? K 4.2 06/15/2021  ? CL 99 06/15/2021  ? CO2 22 06/15/2021  ? ?Lab Results  ?Component Value Date  ? ALT 21 06/15/2021  ? AST 22 06/15/2021  ? ALKPHOS 109 06/15/2021  ? BILITOT 0.4 06/15/2021  ? ?Lab Results  ?Component Value Date  ? HGBA1C 5.4 06/15/2021  ? ?Lab Results  ?Component Value Date  ? INSULIN 20.6 06/15/2021  ? ?Lab Results  ?Component Value Date  ? TSH 1.270 06/15/2021  ? ?Lab Results  ?Component Value Date  ? CHOL 208 (H) 06/15/2021  ? HDL 47 06/15/2021  ? LDLCALC 132 (H) 06/15/2021  ? TRIG 161 (H) 06/15/2021  ? CHOLHDL 4.4 06/15/2021  ? ?Lab Results  ?Component Value Date  ? VD25OH 18.9 (L) 06/15/2021  ? ?Lab Results  ?Component Value Date  ? WBC 8.5 06/15/2021  ? HGB 14.3 06/15/2021  ? HCT 43.7 06/15/2021  ? MCV 92 06/15/2021  ? PLT 332 06/15/2021  ? ?No results found for: IRON, TIBC, FERRITIN ? ?Attestation Statements:  ? ?Reviewed by clinician on day of visit: allergies, medications, problem list, medical history, surgical history, family history, social  history, and previous encounter notes. ? ? ?I, 06/17/2021, am acting as transcriptionist for Burt Knack, DO. ? ?I have reviewed the above documentation for accuracy and completeness, and I agree with the above. [employer], D.O. ? ?The 21st Century Cures Act was signed into law in 2016 which includes the topic of electronic health records.  This provides immediate access to information in MyChart.  This includes consultation notes, operative notes, office notes, lab results and pathology reports.  If you have any questions about what you read please let 2017 know at your next visit so we can discuss your concerns and take corrective action if need be.  We are right here with you. ? ? ?

## 2021-08-11 ENCOUNTER — Ambulatory Visit (INDEPENDENT_AMBULATORY_CARE_PROVIDER_SITE_OTHER): Payer: BC Managed Care – PPO | Admitting: Family Medicine

## 2021-08-11 DIAGNOSIS — F411 Generalized anxiety disorder: Secondary | ICD-10-CM | POA: Diagnosis not present

## 2021-08-15 ENCOUNTER — Telehealth (INDEPENDENT_AMBULATORY_CARE_PROVIDER_SITE_OTHER): Payer: BC Managed Care – PPO | Admitting: Psychology

## 2021-08-15 DIAGNOSIS — F3289 Other specified depressive episodes: Secondary | ICD-10-CM

## 2021-08-16 NOTE — Progress Notes (Signed)
?  Office: [phone]  /  Fax: [phone] ? ? ? ?Date: 08/30/2021   ?Appointment Start Time: 12:02pm ?Duration: 27 minutes ?Provider: Glennie Isle, Psy.D. ?Type of Session: Individual Therapy  ?Location of Patient: Home (private location) ?Location of Provider: Provider's Home (private office) ?Type of Contact: Telepsychological Visit via MyChart Video Visit ? ?Session Content: Tara Kelley is a 41 y.o. female presenting for a follow-up appointment to address the previously established treatment goal of increasing coping skills.Today's appointment was a telepsychological visit due to COVID-19. Tara Kelley provided verbal consent for today's telepsychological appointment and she is aware she is responsible for securing confidentiality on her end of the session. Prior to proceeding with today's appointment, Tara Kelley's physical location at the time of this appointment was obtained as well a phone number she could be reached at in the event of technical difficulties. Tara Kelley and this provider participated in today's telepsychological service.  ? ?This provider conducted a brief check-in. Tara Kelley reported an improvement in sleep, adding she met with the PA at the sleep center. Reviewed self-compassion from last appointment. She discussed regularly checking-in with herself. Positive reinforcement was provided. Tara Kelley acknowledged there have not been significant changes in her eating habits and she continued to describe challenges. Notably, she stated planning typically helps. As such, she was engaged in problem solving to increase consistency of eating congruent to her structured meal plan (e.g., developing a working plan of meals for the day, doubling recipes, roasting vegetables for a few days). This provider and Tara Kelley also focused on habit planning by discussing connecting new habits with existing habits. Moreover, psychoeducation provided regarding all or nothing thinking. Tara Kelley was receptive to today's appointment as evidenced  by openness to sharing, responsiveness to feedback, and willingness to implement discussed strategies . ? ?Mental Status Examination:  ?Appearance: neat ?Behavior: appropriate to circumstances ?Mood: neutral ?Affect: mood congruent ?Speech: WNL ?Eye Contact: appropriate ?Psychomotor Activity: WNL ?Gait: unable to assess ?Thought Process: linear, logical, and goal directed and no evidence or endorsement of suicidal, homicidal, and self-harm ideation, plan and intent  ?Thought Content/Perception: no hallucinations, delusions, bizarre thinking or behavior endorsed or observed ?Orientation: AAOx4 ?Memory/Concentration: memory, attention, language, and fund of knowledge intact  ?Insight: fair ?Judgment: fair ? ?Interventions:  ?Conducted a brief chart review ?Provided empathic reflections and validation ?Employed supportive psychotherapy interventions to facilitate reduced distress and to improve coping skills with identified stressors ?Engaged patient in problem solving ?Psychoeducation provided regarding all-or-nothing thinking ? ?DSM-5 Diagnosis(es): F32.89 Other Specified Depressive Disorder, Emotional and Binge Eating Behaviors ? ?Treatment Goal & Progress: During the initial appointment with this provider, the following treatment goal was established: increase coping skills. Tara Kelley has demonstrated progress in her goal as evidenced by increased awareness of hunger patterns. Tara Kelley also continues to demonstrate willingness to engage in learned skill(s). ? ?Plan: The next appointment is scheduled for 09/20/2021 at 12:30pm, which will be via MyChart Video Visit. The next session will focus on  reviewing discussed strategies and working toward the established treatment goal . ? ?

## 2021-08-18 DIAGNOSIS — F411 Generalized anxiety disorder: Secondary | ICD-10-CM | POA: Diagnosis not present

## 2021-08-22 DIAGNOSIS — G4733 Obstructive sleep apnea (adult) (pediatric): Secondary | ICD-10-CM | POA: Diagnosis not present

## 2021-08-24 ENCOUNTER — Telehealth (INDEPENDENT_AMBULATORY_CARE_PROVIDER_SITE_OTHER): Payer: BC Managed Care – PPO | Admitting: Family Medicine

## 2021-08-25 DIAGNOSIS — F411 Generalized anxiety disorder: Secondary | ICD-10-CM | POA: Diagnosis not present

## 2021-08-30 ENCOUNTER — Telehealth (INDEPENDENT_AMBULATORY_CARE_PROVIDER_SITE_OTHER): Payer: BC Managed Care – PPO | Admitting: Psychology

## 2021-08-30 ENCOUNTER — Ambulatory Visit (INDEPENDENT_AMBULATORY_CARE_PROVIDER_SITE_OTHER): Payer: BC Managed Care – PPO | Admitting: Family Medicine

## 2021-08-30 ENCOUNTER — Encounter (INDEPENDENT_AMBULATORY_CARE_PROVIDER_SITE_OTHER): Payer: Self-pay | Admitting: Family Medicine

## 2021-08-30 ENCOUNTER — Other Ambulatory Visit (INDEPENDENT_AMBULATORY_CARE_PROVIDER_SITE_OTHER): Payer: Self-pay | Admitting: Family Medicine

## 2021-08-30 VITALS — BP 113/76 | HR 91 | Temp 97.4°F | Ht 62.0 in | Wt 295.0 lb

## 2021-08-30 DIAGNOSIS — E559 Vitamin D deficiency, unspecified: Secondary | ICD-10-CM

## 2021-08-30 DIAGNOSIS — E669 Obesity, unspecified: Secondary | ICD-10-CM | POA: Diagnosis not present

## 2021-08-30 DIAGNOSIS — F39 Unspecified mood [affective] disorder: Secondary | ICD-10-CM | POA: Diagnosis not present

## 2021-08-30 DIAGNOSIS — F3289 Other specified depressive episodes: Secondary | ICD-10-CM | POA: Diagnosis not present

## 2021-08-30 DIAGNOSIS — Z6841 Body Mass Index (BMI) 40.0 and over, adult: Secondary | ICD-10-CM | POA: Diagnosis not present

## 2021-08-30 DIAGNOSIS — Z9189 Other specified personal risk factors, not elsewhere classified: Secondary | ICD-10-CM

## 2021-08-31 MED ORDER — BUPROPION HCL ER (SR) 150 MG PO TB12: 150.0000 mg | ORAL_TABLET | Freq: Two times a day (BID) | ORAL | 0 refills | Status: DC

## 2021-08-31 MED ORDER — VITAMIN D (ERGOCALCIFEROL) 1.25 MG (50000 UNIT) PO CAPS: 50000.0000 [IU] | ORAL_CAPSULE | ORAL | 0 refills | Status: DC

## 2021-09-01 DIAGNOSIS — F411 Generalized anxiety disorder: Secondary | ICD-10-CM | POA: Diagnosis not present

## 2021-09-08 NOTE — Progress Notes (Unsigned)
  Office: [phone]  /  Fax: [phone]    Date: 09/20/2021   Appointment Start Time: *** Duration: *** minutes Provider: Lawerance Cruel, Psy.D. Type of Session: Individual Therapy  Location of Patient: {gbptloc:23249} (private location) Location of Provider: Provider's Home (private office) Type of Contact: Telepsychological Visit via MyChart Video Visit  Session Content: This provider called Fleet Contras at 12:33pm as she did not present for today's appointment. A HIPAA compliant voicemail was left requesting a call back.  As such, today's appointment was initiated *** minutes late.Tara Kelley is a 41 y.o. female presenting for a follow-up appointment to address the previously established treatment goal of increasing coping skills.Today's appointment was a telepsychological visit due to COVID-19. Tara Kelley provided verbal consent for today's telepsychological appointment and she is aware she is responsible for securing confidentiality on her end of the session. Prior to proceeding with today's appointment, Tara Kelley's physical location at the time of this appointment was obtained as well a phone number she could be reached at in the event of technical difficulties. Tara Kelley and this provider participated in today's telepsychological service.   This provider conducted a brief check-in. *** Tara Kelley was receptive to today's appointment as evidenced by openness to sharing, responsiveness to feedback, and {gbreceptiveness:23401}.  Mental Status Examination:  Appearance: {Appearance:22431} Behavior: {Behavior:22445} Mood: {gbmood:21757} Affect: {Affect:22436} Speech: {Speech:22432} Eye Contact: {Eye Contact:22433} Psychomotor Activity: {Motor Activity:22434} Gait: {gbgait:23404} Thought Process: {thought process:22448}  Thought Content/Perception: {disturbances:22451} Orientation: {Orientation:22437} Memory/Concentration: {gbcognition:22449} Insight: {Insight:22446} Judgment:  {Insight:22446}  Interventions:  {Interventions for Progress Notes:23405}  DSM-5 Diagnosis(es): F32.89 Other Specified Depressive Disorder, Emotional and Binge Eating Behaviors  Treatment Goal & Progress: During the initial appointment with this provider, the following treatment goal was established: increase coping skills. Tara Kelley has demonstrated progress in her goal as evidenced by {gbtxprogress:22839}. Tara Kelley also {gbtxprogress2:22951}.  Plan: The next appointment is scheduled for *** at ***, which will be via MyChart Video Visit. The next session will focus on {Plan for Next Appointment:23400}.

## 2021-09-14 NOTE — Progress Notes (Signed)
Chief Complaint:   OBESITY Knowledge is here to discuss her progress with her obesity treatment plan along with follow-up of her obesity related diagnoses. Tara Kelley is on keeping a food journal and adhering to recommended goals of 1700-1800 calories and 120 grams of protein daily and states she is following her eating plan approximately 60% of the time. Tara Kelley states she is doing 0 minutes 0 times per week.  Today's visit was #: 6 Starting weight: 311 lbs Starting date: 06/15/2021 Today's weight: 295 lbs Today's date: 08/30/2021 Total lbs lost to date: 16 Total lbs lost since last in-office visit: 2  Interim History: Tara Kelley continues to do well with weight loss. She is getting more bored especially with lunch, and would like more lunch and eating out options.   Subjective:   1. Vitamin D deficiency Tara Kelley is on Vitamin D, with no side effects were noted.   2. 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  3. At risk for heart disease Tara Kelley is at higher than average risk for cardiovascular disease due to obesity.  Assessment/Plan:   1. Vitamin D deficiency We will refill prescription Vitamin D for 1 month. Tara Kelley will follow-up for routine testing of Vitamin D, at least 2-3 times per year to avoid over-replacement.  - Vitamin D, Ergocalciferol, (DRISDOL) 1.25 MG (50000 UNIT) CAPS capsule; Take 1 capsule (50,000 Units total) by mouth every 7 (seven) days.  Dispense: 4 capsule; Refill: 0  2. 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 Behavior modification techniques were discussed today to help Tara Kelley deal with her emotional/non-hunger eating behaviors.  Orders and follow up as documented in patient record.   - buPROPion (WELLBUTRIN SR) 150 MG 12 hr tablet; Take 1 tablet (150 mg total) by mouth 2 (two) times daily.  Dispense: 60 tablet; Refill: 0  3. At risk for heart  disease Tara Kelley was given approximately 15 minutes of coronary artery disease prevention counseling today. She is 41 y.o. female and has risk factors for heart disease including obesity. We discussed intensive lifestyle modifications today with an emphasis on specific weight loss instructions and strategies.  Repetitive spaced learning was employed today to elicit superior memory formation and behavioral change.   4. Obesity, Current BMI 54.0 Tara Kelley is currently in the action stage of change. As such, her goal is to continue with weight loss efforts. She has agreed to the Category 4 Plan and keeping a food journal and adhering to recommended goals of 400-600 calories and 40+ grams of protein at supper daily.   Behavioral modification strategies: increasing lean protein intake and meal planning and cooking strategies.  Tara Kelley has agreed to follow-up with our clinic in 3 weeks. She was informed of the importance of frequent follow-up visits to maximize her success with intensive lifestyle modifications for her multiple health conditions.   Objective:   Blood pressure 113/76, pulse 91, temperature (!) 97.4 F (36.3 C), height 5\' 2"  (1.575 m), weight 295 lb (133.8 kg), SpO2 97 %. Body mass index is 53.96 kg/m.  General: Cooperative, alert, well developed, in no acute distress. HEENT: Conjunctivae and lids unremarkable. Cardiovascular: Regular rhythm.  Lungs: Normal work of breathing. Neurologic: No focal deficits.   Lab Results  Component Value Date   CREATININE 0.81 06/15/2021   BUN 10 06/15/2021   NA 137 06/15/2021   K 4.2 06/15/2021   CL 99 06/15/2021  CO2 22 06/15/2021   Lab Results  Component Value Date   ALT 21 06/15/2021   AST 22 06/15/2021   ALKPHOS 109 06/15/2021   BILITOT 0.4 06/15/2021   Lab Results  Component Value Date   HGBA1C 5.4 06/15/2021   Lab Results  Component Value Date   INSULIN 20.6 06/15/2021   Lab Results  Component Value Date   TSH 1.270  06/15/2021   Lab Results  Component Value Date   CHOL 208 (H) 06/15/2021   HDL 47 06/15/2021   LDLCALC 132 (H) 06/15/2021   TRIG 161 (H) 06/15/2021   CHOLHDL 4.4 06/15/2021   Lab Results  Component Value Date   VD25OH 18.9 (L) 06/15/2021   Lab Results  Component Value Date   WBC 8.5 06/15/2021   HGB 14.3 06/15/2021   HCT 43.7 06/15/2021   MCV 92 06/15/2021   PLT 332 06/15/2021   No results found for: IRON, TIBC, FERRITIN  Attestation Statements:   Reviewed by clinician on day of visit: allergies, medications, problem list, medical history, surgical history, family history, social history, and previous encounter notes.   I, Trixie Dredge, am acting as transcriptionist for Dennard Nip, MD.  I have reviewed the above documentation for accuracy and completeness, and I agree with the above. -  Dennard Nip, MD

## 2021-09-15 DIAGNOSIS — F411 Generalized anxiety disorder: Secondary | ICD-10-CM | POA: Diagnosis not present

## 2021-09-20 ENCOUNTER — Telehealth (INDEPENDENT_AMBULATORY_CARE_PROVIDER_SITE_OTHER): Payer: BC Managed Care – PPO | Admitting: Psychology

## 2021-09-20 ENCOUNTER — Telehealth (INDEPENDENT_AMBULATORY_CARE_PROVIDER_SITE_OTHER): Payer: Self-pay | Admitting: Psychology

## 2021-09-20 NOTE — Telephone Encounter (Signed)
  Office: [phone]  /  Fax: [phone]  Date of Call: Sep 20, 2021  Time of Call: 12:33pm Provider: Lawerance Cruel, PsyD  CONTENT: This provider called Fleet Contras to check-in as she did not present for today's MyChart Video Visit appointment at 12:30pm. A HIPAA compliant voicemail was left requesting a call back. Of note, this provider stayed on the MyChart Video Visit appointment for 5 minutes prior to signing off per the clinic's grace period policy.    PLAN: This provider will wait for Miho to call back. No further follow-up planned by this provider.

## 2021-09-21 ENCOUNTER — Ambulatory Visit (INDEPENDENT_AMBULATORY_CARE_PROVIDER_SITE_OTHER): Payer: BC Managed Care – PPO | Admitting: Family Medicine

## 2021-09-21 ENCOUNTER — Encounter (INDEPENDENT_AMBULATORY_CARE_PROVIDER_SITE_OTHER): Payer: Self-pay | Admitting: Family Medicine

## 2021-09-21 VITALS — BP 113/78 | HR 98 | Temp 97.5°F | Ht 62.0 in | Wt 293.0 lb

## 2021-09-21 DIAGNOSIS — F39 Unspecified mood [affective] disorder: Secondary | ICD-10-CM

## 2021-09-21 DIAGNOSIS — E669 Obesity, unspecified: Secondary | ICD-10-CM | POA: Diagnosis not present

## 2021-09-21 DIAGNOSIS — Z9189 Other specified personal risk factors, not elsewhere classified: Secondary | ICD-10-CM

## 2021-09-21 DIAGNOSIS — Z6841 Body Mass Index (BMI) 40.0 and over, adult: Secondary | ICD-10-CM

## 2021-09-21 DIAGNOSIS — E559 Vitamin D deficiency, unspecified: Secondary | ICD-10-CM | POA: Diagnosis not present

## 2021-09-21 MED ORDER — VITAMIN D (ERGOCALCIFEROL) 1.25 MG (50000 UNIT) PO CAPS: 50000.0000 [IU] | ORAL_CAPSULE | ORAL | 0 refills | Status: DC

## 2021-09-21 MED ORDER — BUPROPION HCL ER (SR) 150 MG PO TB12: 150.0000 mg | ORAL_TABLET | Freq: Two times a day (BID) | ORAL | 0 refills | Status: DC

## 2021-09-22 DIAGNOSIS — F411 Generalized anxiety disorder: Secondary | ICD-10-CM | POA: Diagnosis not present

## 2021-10-03 NOTE — Progress Notes (Unsigned)
Chief Complaint:   OBESITY Tara Kelley is here to discuss her progress with her obesity treatment plan along with follow-up of her obesity related diagnoses. Tara Kelley is on {MWMwtlossportion/plan2:23431} and states she is following her eating plan approximately ***% of the time. Tara Kelley states she is *** *** minutes *** times per week.  Today's visit was #: *** Starting weight: *** Starting date: *** Today's weight: *** Today's date: 09/21/2021 Total lbs lost to date: *** Total lbs lost since last in-office visit: ***  Interim History: ***  Subjective:   1. Vitamin D deficiency ***  2. Mood disorder ([facility]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 ***  3. At risk for heart disease Tara Kelley is at higher than average risk for cardiovascular disease due to obesity.  Assessment/Plan:   1. Vitamin D deficiency *** - Vitamin D, Ergocalciferol, (DRISDOL) 1.25 MG (50000 UNIT) CAPS capsule; Take 1 capsule (50,000 Units total) by mouth every 7 (seven) days.  Dispense: 4 capsule; Refill: 0  2. Mood disorder ([facility]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 *** - buPROPion (WELLBUTRIN SR) 150 MG 12 hr tablet; Take 1 tablet (150 mg total) by mouth 2 (two) times daily.  Dispense: 60 tablet; Refill: 0  3. At risk for heart disease Tara Kelley was given approximately 15 minutes of coronary artery disease prevention counseling today. She is 41 y.o. female and has risk factors for heart disease including obesity. We discussed intensive lifestyle modifications today with an emphasis on specific weight loss instructions and strategies.  Repetitive spaced learning was employed today to elicit superior memory formation and behavioral change.   4. Obesity, Current BMI 53.7 Tara Kelley is currently in the action stage of change. As such, her goal is to continue with weight loss efforts. She has agreed to the Category 4 Plan and keeping a food journal and adhering to recommended goals of 400-600 calories and 40+ grams of protein at supper daily.    Exercise goals: As is.   Behavioral modification strategies: increasing lean protein intake and meal planning and cooking strategies.  Tara Kelley has agreed to follow-up with our clinic in 3 to 4 weeks. She was informed of the importance of frequent follow-up visits to maximize her success with intensive lifestyle modifications for her multiple health conditions.   Objective:   Blood pressure 113/78, pulse 98, temperature (!) 97.5 F (36.4 C), height 5\' 2"  (1.575 m), weight 293 lb (132.9 kg), SpO2 95 %. Body mass index is 53.59 kg/m.  General: Cooperative, alert, well developed, in no acute distress. HEENT: Conjunctivae and lids unremarkable. Cardiovascular: Regular rhythm.  Lungs: Normal work of breathing. Neurologic: No focal deficits.   Lab Results  Component Value Date   CREATININE 0.81 06/15/2021   BUN 10 06/15/2021   NA 137 06/15/2021   K 4.2 06/15/2021   CL 99 06/15/2021   CO2 22 06/15/2021   Lab Results  Component Value Date   ALT 21 06/15/2021   AST 22 06/15/2021   ALKPHOS 109 06/15/2021   BILITOT 0.4 06/15/2021   Lab Results  Component Value Date   HGBA1C 5.4 06/15/2021   Lab Results  Component Value Date   INSULIN 20.6 06/15/2021   Lab Results  Component Value Date   TSH 1.270 06/15/2021   Lab Results  Component Value Date   CHOL 208 (H) 06/15/2021   HDL 47 06/15/2021   LDLCALC 132 (H) 06/15/2021   TRIG 161 (H) 06/15/2021   CHOLHDL 4.4 06/15/2021   Lab Results  Component Value Date   VD25OH 18.9 (  L) 06/15/2021   Lab Results  Component Value Date   WBC 8.5 06/15/2021   HGB 14.3 06/15/2021   HCT 43.7 06/15/2021   MCV 92 06/15/2021   PLT 332 06/15/2021   No results found for: IRON, TIBC, FERRITIN  Attestation Statements:   Reviewed by clinician on day of visit: allergies, medications, problem list, medical history, surgical history, family history, social history, and previous encounter notes.   I, Burt Knack, am acting as  transcriptionist for Quillian Quince, MD.  I have reviewed the above documentation for accuracy and completeness, and I agree with the above. -  ***

## 2021-10-05 ENCOUNTER — Ambulatory Visit (INDEPENDENT_AMBULATORY_CARE_PROVIDER_SITE_OTHER): Payer: BC Managed Care – PPO | Admitting: Family Medicine

## 2021-10-05 ENCOUNTER — Encounter (INDEPENDENT_AMBULATORY_CARE_PROVIDER_SITE_OTHER): Payer: Self-pay

## 2021-10-06 DIAGNOSIS — F411 Generalized anxiety disorder: Secondary | ICD-10-CM | POA: Diagnosis not present

## 2021-10-10 NOTE — Progress Notes (Unsigned)
  Office: [phone]  /  Fax: [phone]    Date: 10/24/2021   Appointment Start Time: *** Duration: *** minutes Provider: Lawerance Cruel, Psy.D. Type of Session: Individual Therapy  Location of Patient: {gbptloc:23249} (private location) Location of Provider: Provider's Home (private office) Type of Contact: Telepsychological Visit via MyChart Video Visit  Session Content: Tara Kelley is a 41 y.o. female presenting for a follow-up appointment to address the previously established treatment goal of increasing coping skills.Today's appointment was a telepsychological visit due to COVID-19. Tara Kelley provided verbal consent for today's telepsychological appointment and she is aware she is responsible for securing confidentiality on her end of the session. Prior to proceeding with today's appointment, Tara Kelley's physical location at the time of this appointment was obtained as well a phone number she could be reached at in the event of technical difficulties. Tara Kelley and this provider participated in today's telepsychological service.   This provider conducted a brief check-in. *** Tara Kelley was receptive to today's appointment as evidenced by openness to sharing, responsiveness to feedback, and {gbreceptiveness:23401}.  Mental Status Examination:  Appearance: {Appearance:22431} Behavior: {Behavior:22445} Mood: {gbmood:21757} Affect: {Affect:22436} Speech: {Speech:22432} Eye Contact: {Eye Contact:22433} Psychomotor Activity: {Motor Activity:22434} Gait: {gbgait:23404} Thought Process: {thought process:22448}  Thought Content/Perception: {disturbances:22451} Orientation: {Orientation:22437} Memory/Concentration: {gbcognition:22449} Insight: {Insight:22446} Judgment: {Insight:22446}  Interventions:  {Interventions for Progress Notes:23405}  DSM-5 Diagnosis(es): F50.89 Other Specified Feeding or Eating Disorder, Emotional and Binge Eating Behaviors  Treatment Goal & Progress: During the initial  appointment with this provider, the following treatment goal was established: increase coping skills. Tara Kelley has demonstrated progress in her goal as evidenced by {gbtxprogress:22839}. Tara Kelley also {gbtxprogress2:22951}.  Plan: The next appointment is scheduled for *** at ***, which will be via MyChart Video Visit. The next session will focus on {Plan for Next Appointment:23400}.

## 2021-10-17 ENCOUNTER — Telehealth (INDEPENDENT_AMBULATORY_CARE_PROVIDER_SITE_OTHER): Payer: BC Managed Care – PPO | Admitting: Family Medicine

## 2021-10-17 ENCOUNTER — Encounter (INDEPENDENT_AMBULATORY_CARE_PROVIDER_SITE_OTHER): Payer: Self-pay | Admitting: Family Medicine

## 2021-10-17 VITALS — Wt 296.0 lb

## 2021-10-17 DIAGNOSIS — E559 Vitamin D deficiency, unspecified: Secondary | ICD-10-CM

## 2021-10-17 DIAGNOSIS — H109 Unspecified conjunctivitis: Secondary | ICD-10-CM | POA: Diagnosis not present

## 2021-10-17 DIAGNOSIS — G4733 Obstructive sleep apnea (adult) (pediatric): Secondary | ICD-10-CM | POA: Diagnosis not present

## 2021-10-17 DIAGNOSIS — E669 Obesity, unspecified: Secondary | ICD-10-CM

## 2021-10-17 DIAGNOSIS — Z6841 Body Mass Index (BMI) 40.0 and over, adult: Secondary | ICD-10-CM

## 2021-10-17 MED ORDER — VITAMIN D (ERGOCALCIFEROL) 1.25 MG (50000 UNIT) PO CAPS: 50000.0000 [IU] | ORAL_CAPSULE | ORAL | 0 refills | Status: DC

## 2021-10-18 NOTE — Progress Notes (Signed)
TeleHealth Visit:  Due to the COVID-19 pandemic, this visit was completed with telemedicine (audio/video) technology to reduce patient and provider exposure as well as to preserve personal protective equipment.   Tara Kelley has verbally consented to this TeleHealth visit. The patient is located at home, the provider is located at the [facility] office. The participants in this visit include the listed provider and patient. The visit was conducted today via MyChart video.   Chief Complaint: OBESITY Tara Kelley is here to discuss her progress with her obesity treatment plan along with follow-up of her obesity related diagnoses. Tara Kelley is on the Category 4 Plan and keeping a food journal and adhering to recommended goals of 400-600 calories and 40+ grams of protein at supper daily and states she is following her eating plan approximately 50% of the time. Tara Kelley states she is walking for 30 minutes 2 times per week.  Today's visit was #: 8 Starting weight: 311 lbs Starting date: 06/15/2021  Interim History: Tara Kelley has pinkeye and her visit was changed to virtual.  She did some celebration eating this weekend but try to portion control.  She feels like she is out of her routine and work is especially busy and meal planning and prepping has been especially difficult.  She is up 3 pounds.  Subjective:   1. OSA (obstructive sleep apnea) Tara Kelley is wearing her CPAP regularly.  She has had to get up earlier and her sleep quantity and quality has been lower than ideal.  This should be ending soon.  2. Vitamin D deficiency Tara Kelley is on vitamin D, and her last vitamin D level was very low.  She requests a refill today.  Assessment/Plan:   1. OSA (obstructive sleep apnea) Tara Kelley's goal is to get 7-8 hours of sleep per night to help with health and weight loss.  We will continue to follow.  2. Vitamin D deficiency We will refill prescription vitamin D for 1 month, and we will plan to  recheck labs in 1 month.  - Vitamin D, Ergocalciferol, (DRISDOL) 1.25 MG (50000 UNIT) CAPS capsule; Take 1 capsule (50,000 Units total) by mouth every 7 (seven) days.  Dispense: 4 capsule; Refill: 0  3. Obesity, Current BMI 53.7 Tara Kelley is currently in the action stage of change. As such, her goal is to continue with weight loss efforts. She has agreed to the Category 4 Plan and keeping a food journal and adhering to recommended goals of 400-600 calories and 40+ grams of protein at supper daily.   Exercise goals: As is.   Behavioral modification strategies: increasing lean protein intake, decreasing simple carbohydrates, and meal planning and cooking strategies.  Tara Kelley has agreed to follow-up with our clinic in 2 weeks. She was informed of the importance of frequent follow-up visits to maximize her success with intensive lifestyle modifications for her multiple health conditions.  Objective:   VITALS: Per patient if applicable, see vitals. GENERAL: Alert and in no acute distress. CARDIOPULMONARY: No increased WOB. Speaking in clear sentences.  PSYCH: Pleasant and cooperative. Speech normal rate and rhythm. Affect is appropriate. Insight and judgement are appropriate. Attention is focused, linear, and appropriate.  NEURO: Oriented as arrived to appointment on time with no prompting.   Lab Results  Component Value Date   CREATININE 0.81 06/15/2021   BUN 10 06/15/2021   NA 137 06/15/2021   K 4.2 06/15/2021   CL 99 06/15/2021   CO2 22 06/15/2021   Lab Results  Component Value Date  ALT 21 06/15/2021   AST 22 06/15/2021   ALKPHOS 109 06/15/2021   BILITOT 0.4 06/15/2021   Lab Results  Component Value Date   HGBA1C 5.4 06/15/2021   Lab Results  Component Value Date   INSULIN 20.6 06/15/2021   Lab Results  Component Value Date   TSH 1.270 06/15/2021   Lab Results  Component Value Date   CHOL 208 (H) 06/15/2021   HDL 47 06/15/2021   LDLCALC 132 (H) 06/15/2021   TRIG 161  (H) 06/15/2021   CHOLHDL 4.4 06/15/2021   Lab Results  Component Value Date   VD25OH 18.9 (L) 06/15/2021   Lab Results  Component Value Date   WBC 8.5 06/15/2021   HGB 14.3 06/15/2021   HCT 43.7 06/15/2021   MCV 92 06/15/2021   PLT 332 06/15/2021   No results found for: "IRON", "TIBC", "FERRITIN"  Attestation Statements:   Reviewed by clinician on day of visit: allergies, medications, problem list, medical history, surgical history, family history, social history, and previous encounter notes.   I, Burt Knack, am acting as transcriptionist for Quillian Quince, MD.  I have reviewed the above documentation for accuracy and completeness, and I agree with the above. - Quillian Quince, MD

## 2021-10-24 ENCOUNTER — Telehealth (INDEPENDENT_AMBULATORY_CARE_PROVIDER_SITE_OTHER): Payer: BC Managed Care – PPO | Admitting: Psychology

## 2021-10-24 DIAGNOSIS — F3289 Other specified depressive episodes: Secondary | ICD-10-CM

## 2021-11-02 NOTE — Progress Notes (Signed)
Patient signed in for virtual visit -was in [place]. Unable to complete virtual visit due to patient's location (out of [place]).

## 2021-11-07 ENCOUNTER — Other Ambulatory Visit (INDEPENDENT_AMBULATORY_CARE_PROVIDER_SITE_OTHER): Payer: Self-pay | Admitting: Family Medicine

## 2021-11-07 ENCOUNTER — Telehealth (INDEPENDENT_AMBULATORY_CARE_PROVIDER_SITE_OTHER): Payer: BC Managed Care – PPO | Admitting: Family Medicine

## 2021-11-07 DIAGNOSIS — Z6841 Body Mass Index (BMI) 40.0 and over, adult: Secondary | ICD-10-CM

## 2021-11-07 DIAGNOSIS — E559 Vitamin D deficiency, unspecified: Secondary | ICD-10-CM

## 2021-11-07 MED ORDER — VITAMIN D (ERGOCALCIFEROL) 1.25 MG (50000 UNIT) PO CAPS: 50000.0000 [IU] | ORAL_CAPSULE | ORAL | 0 refills | Status: DC

## 2021-11-07 NOTE — Progress Notes (Signed)
  Office: [phone]  /  Fax: [phone]    Date: 11/21/2021   Appointment Start Time: 4:34pm Duration: 27 minutes Provider: Lawerance Cruel, Psy.D. Type of Session: Individual Therapy  Location of Patient: Home (private location) Location of Provider: Provider's Home (private office) Type of Contact: Telepsychological Visit via MyChart Video Visit  Session Content: Tara Kelley is a 41 y.o. female presenting for a follow-up appointment to address the previously established treatment goal of increasing coping skills.Today's appointment was a telepsychological visit. Tara Kelley provided verbal consent for today's telepsychological appointment and she is aware she is responsible for securing confidentiality on her end of the session. Prior to proceeding with today's appointment, Tara Kelley's physical location at the time of this appointment was obtained as well a phone number she could be reached at in the event of technical difficulties. Tara Kelley and this provider participated in today's telepsychological service.   This provider conducted a brief check-in. Tara Kelley shared about her recent trip to [place]. She also shared about her recent visit with Dr. Dalbert Garnet. Tara Kelley acknowledged challenges with her eating habits while away and overall self-care. Thus, this provider and Tara Kelley discussed what went well and what she could do differently in the future. Notably, Tara Kelley described a reduction in emotional and binge eating behaviors. Tara Kelley of today's appointment focused further on self-compassion (self-kindness, common humanity, and mindfulness). Overall, Tara Kelley was receptive to today's appointment as evidenced by openness to sharing, responsiveness to feedback, and willingness to work toward increasing self-compassion.  Mental Status Examination:  Appearance: neat Behavior: appropriate to circumstances Mood: neutral Affect: mood congruent Speech: WNL Eye Contact: appropriate Psychomotor Activity: WNL Gait:  unable to assess Thought Process: linear, logical, and goal directed and no evidence or endorsement of suicidal, homicidal, and self-harm ideation, plan and intent  Thought Content/Perception: no hallucinations, delusions, bizarre thinking or behavior endorsed or observed Orientation: AAOx4 Memory/Concentration: memory, attention, language, and fund of knowledge intact  Insight: good Judgment: fair  Interventions:  Conducted a brief chart review Provided empathic reflections and validation Reviewed content from the previous session Employed supportive psychotherapy interventions to facilitate reduced distress and to improve coping skills with identified stressors Psychoeducation provided regarding self-compassion  DSM-5 Diagnosis(es): F50.89 Other Specified Feeding or Eating Disorder, Emotional and Binge Eating Behaviors  Treatment Goal & Progress: During the initial appointment with this provider, the following treatment goal was established: increase coping skills. Tara Kelley has demonstrated progress in her goal as evidenced by increased awareness of hunger patterns, increased awareness of triggers for emotional eating behaviors, reduction in emotional eating behaviors , and reduction in binge eating behaviors. Tara Kelley also continues to demonstrate willingness to engage in learned skill(s).  Plan: The next appointment is scheduled for 12/13/2021 at 11:30am, which will be via MyChart Video Visit. The next session will focus on working towards the established treatment goal.

## 2021-11-08 ENCOUNTER — Other Ambulatory Visit (INDEPENDENT_AMBULATORY_CARE_PROVIDER_SITE_OTHER): Payer: Self-pay | Admitting: Family Medicine

## 2021-11-08 DIAGNOSIS — F39 Unspecified mood [affective] disorder: Secondary | ICD-10-CM

## 2021-11-20 ENCOUNTER — Other Ambulatory Visit (INDEPENDENT_AMBULATORY_CARE_PROVIDER_SITE_OTHER): Payer: Self-pay | Admitting: Family Medicine

## 2021-11-20 DIAGNOSIS — F39 Unspecified mood [affective] disorder: Secondary | ICD-10-CM

## 2021-11-21 ENCOUNTER — Ambulatory Visit (INDEPENDENT_AMBULATORY_CARE_PROVIDER_SITE_OTHER): Payer: BC Managed Care – PPO | Admitting: Family Medicine

## 2021-11-21 ENCOUNTER — Telehealth (INDEPENDENT_AMBULATORY_CARE_PROVIDER_SITE_OTHER): Payer: BC Managed Care – PPO | Admitting: Psychology

## 2021-11-21 ENCOUNTER — Encounter (INDEPENDENT_AMBULATORY_CARE_PROVIDER_SITE_OTHER): Payer: Self-pay | Admitting: Family Medicine

## 2021-11-21 VITALS — BP 125/84 | HR 89 | Temp 97.9°F | Ht 62.0 in | Wt 292.0 lb

## 2021-11-21 DIAGNOSIS — E669 Obesity, unspecified: Secondary | ICD-10-CM

## 2021-11-21 DIAGNOSIS — F3289 Other specified depressive episodes: Secondary | ICD-10-CM | POA: Diagnosis not present

## 2021-11-21 DIAGNOSIS — E559 Vitamin D deficiency, unspecified: Secondary | ICD-10-CM

## 2021-11-21 DIAGNOSIS — F39 Unspecified mood [affective] disorder: Secondary | ICD-10-CM | POA: Diagnosis not present

## 2021-11-21 DIAGNOSIS — I10 Essential (primary) hypertension: Secondary | ICD-10-CM | POA: Diagnosis not present

## 2021-11-21 DIAGNOSIS — Z6841 Body Mass Index (BMI) 40.0 and over, adult: Secondary | ICD-10-CM

## 2021-11-21 MED ORDER — BUPROPION HCL ER (SR) 150 MG PO TB12: 150.0000 mg | ORAL_TABLET | Freq: Two times a day (BID) | ORAL | 0 refills | Status: DC

## 2021-11-21 MED ORDER — VITAMIN D (ERGOCALCIFEROL) 1.25 MG (50000 UNIT) PO CAPS: 50000.0000 [IU] | ORAL_CAPSULE | ORAL | 0 refills | Status: DC

## 2021-11-28 NOTE — Progress Notes (Unsigned)
Chief Complaint:   OBESITY Tara Kelley is here to discuss her progress with her obesity treatment plan along with follow-up of her obesity related diagnoses. Tara Kelley is on the Category 4 Plan and keeping a food journal and adhering to recommended goals of 400-600 calories and 40+ grams of protein at supper daily and states she is following her eating plan approximately 10% of the time. Tara Kelley states she is doing 0 minutes 0 times per week.  Today's visit was #: 9 Starting weight: 311 lbs Starting date: 06/15/2021 Today's weight: 292 lbs Today's date: 11/21/2021 Total lbs lost to date: 19 Total lbs lost since last in-office visit: 1  Interim History: Tara Kelley has been especially busy in the last month with traveling and caring for her mother, in her 70-1/2-year-old niece.  She is getting back on track but she did well with avoiding significant weight gain.  Subjective:   1. Essential hypertension Tara Kelley's blood pressure is well controlled on her medications.  She continues to work on weight loss, and she denies lightheadedness.  2. Vitamin D deficiency Tara Kelley is stable on vitamin D, and she is due to have labs checked soon.  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  She did well with minimizing emotional eating behaviors even when off her routine and stress was increased.  Assessment/Plan:   1. Essential hypertension Tara Kelley will continue with her diet, exercise, and medications.  2. Vitamin D deficiency Tara Kelley will continue prescription vitamin D 50,000 units once every 7 days, and we will refill for 1 month.  We will recheck labs in 2 to 3 weeks.  - Vitamin D, Ergocalciferol, (DRISDOL) 1.25 MG (50000 UNIT) CAPS capsule; Take 1 capsule (50,000 Units total) by mouth every 7 (seven) days.  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  - buPROPion (WELLBUTRIN SR) 150 MG 12 hr tablet; Take 1 tablet (150 mg total) by mouth 2 (two) times daily.  Dispense: 60 tablet; Refill: 0  4. Obesity, Current BMI 53.4 Tara Kelley is currently in the action stage of change. As such, her goal is to continue with weight loss efforts. She has agreed to the Category 3 Plan and keeping a food journal and adhering to recommended goals of 400-600 calories and 40+ grams of protein at supper daily.   Behavioral modification strategies: increasing lean protein intake and increasing water intake.  Tara Kelley has agreed to follow-up with our clinic in 2 weeks. She was informed of the importance of frequent follow-up visits to maximize her success with intensive lifestyle modifications for her multiple health conditions.   Objective:   Blood pressure 125/84, pulse 89, temperature 97.9 F (36.6 C), height 5\' 2"  (1.575 m), weight 292 lb (132.5 kg), SpO2 99 %. Body mass index is 53.41 kg/m.  General: Cooperative, alert, well developed, in no acute distress. HEENT: Conjunctivae and lids unremarkable. Cardiovascular: Regular rhythm.  Lungs: Normal work of breathing. Neurologic: No focal deficits.   Lab Results  Component Value Date   CREATININE 0.81 06/15/2021   BUN 10 06/15/2021   NA 137 06/15/2021   K 4.2 06/15/2021   CL 99 06/15/2021   CO2 22 06/15/2021   Lab Results  Component Value Date   ALT 21 06/15/2021   AST 22 06/15/2021   ALKPHOS 109 06/15/2021   BILITOT 0.4 06/15/2021   Lab  Results  Component Value Date   HGBA1C 5.4 06/15/2021   Lab Results  Component Value Date   INSULIN 20.6 06/15/2021   Lab Results  Component Value Date   TSH 1.270 06/15/2021   Lab Results  Component Value Date   CHOL 208 (H) 06/15/2021   HDL 47 06/15/2021   LDLCALC 132 (H) 06/15/2021   TRIG 161 (H) 06/15/2021   CHOLHDL 4.4 06/15/2021   Lab Results  Component Value Date   VD25OH 18.9 (L) 06/15/2021   Lab Results  Component Value Date    WBC 8.5 06/15/2021   HGB 14.3 06/15/2021   HCT 43.7 06/15/2021   MCV 92 06/15/2021   PLT 332 06/15/2021   No results found for: "IRON", "TIBC", "FERRITIN"  Attestation Statements:   Reviewed by clinician on day of visit: allergies, medications, problem list, medical history, surgical history, family history, social history, and previous encounter notes.   I, Burt Knack, am acting as transcriptionist for Quillian Quince, MD.  I have reviewed the above documentation for accuracy and completeness, and I agree with the above. -  Quillian Quince, MD

## 2021-12-01 DIAGNOSIS — F411 Generalized anxiety disorder: Secondary | ICD-10-CM | POA: Diagnosis not present

## 2021-12-05 ENCOUNTER — Ambulatory Visit (INDEPENDENT_AMBULATORY_CARE_PROVIDER_SITE_OTHER): Payer: BC Managed Care – PPO | Admitting: Family Medicine

## 2021-12-05 ENCOUNTER — Encounter (INDEPENDENT_AMBULATORY_CARE_PROVIDER_SITE_OTHER): Payer: Self-pay | Admitting: Family Medicine

## 2021-12-05 VITALS — BP 129/83 | HR 91 | Temp 97.6°F | Ht 62.0 in | Wt 292.0 lb

## 2021-12-05 DIAGNOSIS — Z6841 Body Mass Index (BMI) 40.0 and over, adult: Secondary | ICD-10-CM

## 2021-12-05 DIAGNOSIS — E559 Vitamin D deficiency, unspecified: Secondary | ICD-10-CM

## 2021-12-05 DIAGNOSIS — E669 Obesity, unspecified: Secondary | ICD-10-CM | POA: Diagnosis not present

## 2021-12-05 DIAGNOSIS — F3289 Other specified depressive episodes: Secondary | ICD-10-CM

## 2021-12-07 ENCOUNTER — Encounter (INDEPENDENT_AMBULATORY_CARE_PROVIDER_SITE_OTHER): Payer: Self-pay

## 2021-12-12 DIAGNOSIS — F411 Generalized anxiety disorder: Secondary | ICD-10-CM | POA: Diagnosis not present

## 2021-12-12 NOTE — Progress Notes (Unsigned)
Chief Complaint:   OBESITY Tara Kelley is here to discuss her progress with her obesity treatment plan along with follow-up of her obesity related diagnoses. Tara Kelley is on the Category 3 Plan and keeping a food journal and adhering to recommended goals of 400-600 calories and 40+ grams of protein at supper daily and states she is following her eating plan approximately 70% of the time. Tara Kelley states she is doing 0 minutes 0 times per week.  Today's visit was #: 10 Starting weight: 311 lbs Starting date: 06/15/2021 Today's weight: 292 lbs Today's date: 12/05/2021 Total lbs lost to date: 19 Total lbs lost since last in-office visit: 0  Interim History: Tara Kelley did well with following her plan for the last 2 weeks, but she did some social/celebration over the weekend and then struggled to get back on track.  Subjective:   1. Vitamin D deficiency Tara Kelley is on vitamin D and she is doing well, with no side effects noted.  She is not yet due to have labs rechecked.  2. Other depression, with emotional eating behaviors Tara Kelley notes some increased emotional eating behaviors, and she is on Wellbutrin but sometimes struggle to take her second dose which decreased the effectiveness of the medication.  Assessment/Plan:   1. Vitamin D deficiency Tara Kelley will continue her vitamin D, and we will recheck labs in 1 month.  2. Other depression, with emotional eating behaviors Tara Kelley will continue Wellbutrin 150 mg twice daily and she will work on remembering to take her second dose.  3. Obesity, Current BMI 53.5 Tara Kelley is currently in the action stage of change. As such, her goal is to continue with weight loss efforts. She has agreed to the Category 3 Plan and keeping a food journal and adhering to recommended goals of 400-600 calories and 40+ grams of protein at supper daily.   Behavioral modification strategies: emotional eating strategies.  Tara Kelley has agreed to follow-up with our clinic in 2 to 3  weeks. She was informed of the importance of frequent follow-up visits to maximize her success with intensive lifestyle modifications for her multiple health conditions.   Objective:   Blood pressure 129/83, pulse 91, temperature 97.6 F (36.4 C), height 5\' 2"  (1.575 m), weight 292 lb (132.5 kg), SpO2 95 %. Body mass index is 53.41 kg/m.  General: Cooperative, alert, well developed, in no acute distress. HEENT: Conjunctivae and lids unremarkable. Cardiovascular: Regular rhythm.  Lungs: Normal work of breathing. Neurologic: No focal deficits.   Lab Results  Component Value Date   CREATININE 0.81 06/15/2021   BUN 10 06/15/2021   NA 137 06/15/2021   K 4.2 06/15/2021   CL 99 06/15/2021   CO2 22 06/15/2021   Lab Results  Component Value Date   ALT 21 06/15/2021   AST 22 06/15/2021   ALKPHOS 109 06/15/2021   BILITOT 0.4 06/15/2021   Lab Results  Component Value Date   HGBA1C 5.4 06/15/2021   Lab Results  Component Value Date   INSULIN 20.6 06/15/2021   Lab Results  Component Value Date   TSH 1.270 06/15/2021   Lab Results  Component Value Date   CHOL 208 (H) 06/15/2021   HDL 47 06/15/2021   LDLCALC 132 (H) 06/15/2021   TRIG 161 (H) 06/15/2021   CHOLHDL 4.4 06/15/2021   Lab Results  Component Value Date   VD25OH 18.9 (L) 06/15/2021   Lab Results  Component Value Date   WBC 8.5 06/15/2021   HGB 14.3 06/15/2021  HCT 43.7 06/15/2021   MCV 92 06/15/2021   PLT 332 06/15/2021   No results found for: "IRON", "TIBC", "FERRITIN"  Attestation Statements:   Reviewed by clinician on day of visit: allergies, medications, problem list, medical history, surgical history, family history, social history, and previous encounter notes.  ime spent on visit including pre-visit chart review and post-visit care and charting was 40 minutes.   I, Burt Knack, am acting as transcriptionist for Quillian Quince, MD.  I have reviewed the above documentation for accuracy and  completeness, and I agree with the above. -  Quillian Quince, MD

## 2021-12-13 ENCOUNTER — Telehealth (INDEPENDENT_AMBULATORY_CARE_PROVIDER_SITE_OTHER): Payer: BC Managed Care – PPO | Admitting: Psychology

## 2021-12-13 DIAGNOSIS — F5089 Other specified eating disorder: Secondary | ICD-10-CM | POA: Diagnosis not present

## 2021-12-13 DIAGNOSIS — F3289 Other specified depressive episodes: Secondary | ICD-10-CM

## 2021-12-13 NOTE — Progress Notes (Signed)
  Office: [phone]  /  Fax: [phone]    Date: December 13, 2021    Appointment Start Time: 11:32am Duration: 33 minutes Provider: Lawerance Cruel, Psy.D. Type of Session: Individual Therapy  Location of Patient: Home (private location) Location of Provider: Provider's Home (private office) Type of Contact: Telepsychological Visit via MyChart Video Visit  Session Content: Tara Kelley is a 41 y.o. female presenting for a follow-up appointment to address the previously established treatment goal of increasing coping skills.Today's appointment was a telepsychological visit. Tara Kelley provided verbal consent for today's telepsychological appointment and she is aware she is responsible for securing confidentiality on her end of the session. Prior to proceeding with today's appointment, Callee's physical location at the time of this appointment was obtained as well a phone number she could be reached at in the event of technical difficulties. Tarita and this provider participated in today's telepsychological service.   This provider conducted a brief check-in. Tara Kelley shared about recent events, including work-related stressors and her recent appointment with Dr. Dalbert Garnet. She also discussed challenges with going out to eat, celebrations, etc. As such, self-compassion was reviewed, and psychoeducation regarding making better choices and engaging in portion control during the holidays/celebrations/vacations was provided. More specifically, this provider discussed the following strategies: coming to meals hungry, but not starving; avoid filling up on appetizers; managing portion sizes; not completely depriving yourself; making the plate colorful (e.g., vegetables); pacing yourself (e.g., waiting 10 minutes before going back for seconds); taking advantage of the nutritious foods; practicing mindfulness; staying hydrated; and avoid bringing home leftovers. Of note, Tara Kelley discussed she has not been meeting with her  primary therapist. She was receptive to this provider sending an e-mail with referral options to establish care with a new provider. Overall, Tara Kelley was receptive to today's appointment as evidenced by openness to sharing, responsiveness to feedback, and willingness to implement discussed strategies .  Mental Status Examination:  Appearance: neat Behavior: appropriate to circumstances Mood: neutral Affect: mood congruent Speech: WNL Eye Contact: appropriate Psychomotor Activity: WNL Gait: unable to assess Thought Process: linear, logical, and goal directed and no evidence or endorsement of suicidal, homicidal, and self-harm ideation, plan and intent  Thought Content/Perception: no hallucinations, delusions, bizarre thinking or behavior endorsed or observed Orientation: AAOx4 Memory/Concentration: memory, attention, language, and fund of knowledge intact  Insight: good Judgment: fair  Interventions:  Conducted a brief chart review Provided empathic reflections and validation Reviewed content from the previous session Employed supportive psychotherapy interventions to facilitate reduced distress and to improve coping skills with identified stressors Psychoeducation provided regarding strategies for celebrations/holidays/vacations  DSM-5 Diagnosis(es): F50.89 Other Specified Feeding or Eating Disorder, Emotional and Binge Eating Behaviors  Treatment Goal & Progress: During the initial appointment with this provider, the following treatment goal was established: increase coping skills. Tara Kelley has demonstrated progress in her goal as evidenced by increased awareness of hunger patterns, increased awareness of triggers for emotional eating behaviors, reduction in emotional eating behaviors , and reduction in binge eating behaviors. Tara Kelley also continues to demonstrate willingness to engage in learned skill(s).   Plan: The next appointment is scheduled for 12/26/2021 at 2pm, which will be via  MyChart Video Visit. The next session will focus on working towards the established treatment goal. Additionally, this provider will e-mail referral options.

## 2021-12-20 DIAGNOSIS — F411 Generalized anxiety disorder: Secondary | ICD-10-CM | POA: Diagnosis not present

## 2021-12-21 ENCOUNTER — Encounter (INDEPENDENT_AMBULATORY_CARE_PROVIDER_SITE_OTHER): Payer: Self-pay | Admitting: Family Medicine

## 2021-12-21 ENCOUNTER — Ambulatory Visit (INDEPENDENT_AMBULATORY_CARE_PROVIDER_SITE_OTHER): Payer: BC Managed Care – PPO | Admitting: Family Medicine

## 2021-12-21 VITALS — BP 120/80 | HR 84 | Temp 97.8°F | Ht 62.0 in | Wt 288.0 lb

## 2021-12-21 DIAGNOSIS — E8881 Metabolic syndrome: Secondary | ICD-10-CM | POA: Diagnosis not present

## 2021-12-21 DIAGNOSIS — I10 Essential (primary) hypertension: Secondary | ICD-10-CM | POA: Diagnosis not present

## 2021-12-21 DIAGNOSIS — E559 Vitamin D deficiency, unspecified: Secondary | ICD-10-CM

## 2021-12-21 DIAGNOSIS — E669 Obesity, unspecified: Secondary | ICD-10-CM

## 2021-12-21 DIAGNOSIS — F39 Unspecified mood [affective] disorder: Secondary | ICD-10-CM

## 2021-12-21 DIAGNOSIS — Z6841 Body Mass Index (BMI) 40.0 and over, adult: Secondary | ICD-10-CM

## 2021-12-21 DIAGNOSIS — E782 Mixed hyperlipidemia: Secondary | ICD-10-CM | POA: Diagnosis not present

## 2021-12-21 MED ORDER — VITAMIN D (ERGOCALCIFEROL) 1.25 MG (50000 UNIT) PO CAPS: 50000.0000 [IU] | ORAL_CAPSULE | ORAL | 0 refills | Status: DC

## 2021-12-21 MED ORDER — BUPROPION HCL ER (SR) 150 MG PO TB12: 150.0000 mg | ORAL_TABLET | Freq: Two times a day (BID) | ORAL | 0 refills | Status: DC

## 2021-12-22 LAB — CMP14+EGFR
ALT: 17 IU/L (ref 0–32)
AST: 17 IU/L (ref 0–40)
Albumin/Globulin Ratio: 1.4 (ref 1.2–2.2)
Albumin: 4.1 g/dL (ref 3.9–4.9)
Alkaline Phosphatase: 106 IU/L (ref 44–121)
BUN/Creatinine Ratio: 12 (ref 9–23)
BUN: 10 mg/dL (ref 6–24)
Bilirubin Total: 0.4 mg/dL (ref 0.0–1.2)
CO2: 23 mmol/L (ref 20–29)
Calcium: 9.3 mg/dL (ref 8.7–10.2)
Chloride: 99 mmol/L (ref 96–106)
Creatinine, Ser: 0.83 mg/dL (ref 0.57–1.00)
Globulin, Total: 2.9 g/dL (ref 1.5–4.5)
Glucose: 90 mg/dL (ref 70–99)
Potassium: 4.1 mmol/L (ref 3.5–5.2)
Sodium: 136 mmol/L (ref 134–144)
Total Protein: 7 g/dL (ref 6.0–8.5)
eGFR: 91 mL/min/{1.73_m2} (ref >59)

## 2021-12-22 LAB — HEMOGLOBIN A1C
Est. average glucose Bld gHb Est-mCnc: 108 mg/dL
Hgb A1c MFr Bld: 5.4 % (ref 4.8–5.6)

## 2021-12-22 LAB — VITAMIN D 25 HYDROXY (VIT D DEFICIENCY, FRACTURES): Vit D, 25-Hydroxy: 42.3 ng/mL (ref 30.0–100.0)

## 2021-12-22 LAB — LIPID PANEL WITH LDL/HDL RATIO
Cholesterol, Total: 219 mg/dL — ABNORMAL HIGH (ref 100–199)
HDL: 47 mg/dL (ref >39)
LDL Chol Calc (NIH): 147 mg/dL — ABNORMAL HIGH (ref 0–99)
LDL/HDL Ratio: 3.1 ratio (ref 0.0–3.2)
Triglycerides: 141 mg/dL (ref 0–149)
VLDL Cholesterol Cal: 25 mg/dL (ref 5–40)

## 2021-12-22 LAB — INSULIN, RANDOM: INSULIN: 21.5 u[IU]/mL (ref 2.6–24.9)

## 2021-12-26 ENCOUNTER — Telehealth (INDEPENDENT_AMBULATORY_CARE_PROVIDER_SITE_OTHER): Payer: BC Managed Care – PPO | Admitting: Psychology

## 2021-12-26 ENCOUNTER — Telehealth (INDEPENDENT_AMBULATORY_CARE_PROVIDER_SITE_OTHER): Payer: Self-pay | Admitting: Psychology

## 2021-12-26 NOTE — Progress Notes (Incomplete)
  Office: [phone]  /  Fax: [phone]    Date: December 26, 2021   Appointment Start Time: *** Duration: *** minutes Provider: Lawerance Cruel, Psy.D. Type of Session: Individual Therapy  Location of Patient: {gbptloc:23249} (private location) Location of Provider: Provider's Home (private office) Type of Contact: Telepsychological Visit via MyChart Video Visit  Session Content:This provider called Fleet Contras at 2:03pm as she did not present for today's appointment. This provider was unable to leave a voicemail as Wm's mailbox was full. As such, today's appointment was initiated *** minutes late. Sina is a 41 y.o. female presenting for a follow-up appointment to address the previously established treatment goal of increasing coping skills.Today's appointment was a telepsychological visit. Aquita provided verbal consent for today's telepsychological appointment and she is aware she is responsible for securing confidentiality on her end of the session. Prior to proceeding with today's appointment, Gizell's physical location at the time of this appointment was obtained as well a phone number she could be reached at in the event of technical difficulties. Oliana and this provider participated in today's telepsychological service.   This provider conducted a brief check-in. *** Chanette was receptive to today's appointment as evidenced by openness to sharing, responsiveness to feedback, and {gbreceptiveness:23401}.  Mental Status Examination:  Appearance: {Appearance:22431} Behavior: {Behavior:22445} Mood: {gbmood:21757} Affect: {Affect:22436} Speech: {Speech:22432} Eye Contact: {Eye Contact:22433} Psychomotor Activity: {Motor Activity:22434} Gait: {gbgait:23404} Thought Process: {thought process:22448}  Thought Content/Perception: {disturbances:22451} Orientation: {Orientation:22437} Memory/Concentration: {gbcognition:22449} Insight: {Insight:22446} Judgment:  {Insight:22446}  Interventions:  {Interventions for Progress Notes:23405}  DSM-5 Diagnosis(es): F32.89 Other Specified Depressive Disorder, Emotional and Binge Eating Behaviors  Treatment Goal & Progress: During the initial appointment with this provider, the following treatment goal was established: increase coping skills. Jaclyne has demonstrated progress in her goal as evidenced by {gbtxprogress:22839}. Kila also {gbtxprogress2:22951}.  Plan: The next appointment is scheduled for *** at ***, which will be via MyChart Video Visit. The next session will focus on {Plan for Next Appointment:23400}.

## 2021-12-26 NOTE — Telephone Encounter (Signed)
  Office: [phone]  /  Fax: [phone]  Date of Call: December 26, 2021  Time of Call: 2:03pm Provider: Lawerance Cruel, PsyD  CONTENT:  This provider called Fleet Contras to check-in as she did not present for today's MyChart Video Visit appointment at 2pm.. A HIPAA compliant voicemail requesting a call back could not be left as her mailbox was full.   PLAN: This provider will wait for Jett to call back. No further follow-up planned by this provider.

## 2021-12-27 DIAGNOSIS — F411 Generalized anxiety disorder: Secondary | ICD-10-CM | POA: Diagnosis not present

## 2021-12-27 DIAGNOSIS — G4733 Obstructive sleep apnea (adult) (pediatric): Secondary | ICD-10-CM | POA: Diagnosis not present

## 2021-12-29 NOTE — Progress Notes (Signed)
Chief Complaint:   OBESITY Tara Kelley is here to discuss her progress with her obesity treatment plan along with follow-up of her obesity related diagnoses. Tara Kelley is on the Category 3 Plan and keeping a food journal and adhering to recommended goals of 400-600 calories and 40+ grams of protein at supper daily and states she is following her eating plan approximately 70% of the time. Tara Kelley states she is doing 0 minutes 0 times per week.  Today's visit was #: 11 Starting weight: 311 lbs Starting date: 06/15/2021 Today's weight: 288 lbs Today's date: 12/21/2021 Total lbs lost to date: 23 Total lbs lost since last in-office visit: 4  Interim History: Tara Kelley continues to do well with her weight loss.  She sometimes struggles to eat all of the protein on her meals.  Subjective:   1. Essential hypertension Tara Kelley's blood pressure is well controlled, and she is due for labs.  She denies chest pain or headache.  2. Vitamin D deficiency Tara Kelley is stable on vitamin D, and she is due for labs.  3. Mixed hyperlipidemia Tara Kelley is working on her diet, and she is not on statin.  She denies chest pain.  4. Insulin resistance Tara Kelley is working on her diet and exercise, and she is due for labs.  5. Mood disorder ([place]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 Tara Kelley is doing well with decreasing emotional eating behaviors.  No side effects were noted with her medications.  Assessment/Plan:   1. Essential hypertension We will check labs today. Temperance is working on healthy weight loss and exercise to improve blood pressure control. We will watch for signs of hypotension as she continues her lifestyle modifications.  - CMP14+EGFR  2. Vitamin D deficiency We will check labs today, and we will refill prescription vitamin D 50,000 units once weekly for 1 month.  - VITAMIN D 25 Hydroxy (Vit-D Deficiency, Fractures) - Vitamin D, Ergocalciferol, (DRISDOL) 1.25 MG (50000 UNIT) CAPS capsule; Take 1 capsule (50,000  Units total) by mouth every 7 (seven) days.  Dispense: 4 capsule; Refill: 0  3. Mixed hyperlipidemia We will check labs today. Tara Kelley will continue to work on diet, exercise and weight loss efforts. Orders and follow up as documented in patient record.   - Lipid Panel With LDL/HDL Ratio  4. Insulin resistance We will check labs today. Tara Kelley will continue to work on weight loss, exercise, and decreasing simple carbohydrates to help decrease the risk of diabetes. Tara Kelley agreed to follow-up with [place] as directed to closely monitor her progress.  - Insulin, random - Hemoglobin A1c  5. Mood disorder ([place]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 Tara Kelley will continue Wellbutrin SR 150 mg BID, and we will refill for 1 month.  - buPROPion (WELLBUTRIN SR) 150 MG 12 hr tablet; Take 1 tablet (150 mg total) by mouth 2 (two) times daily.  Dispense: 60 tablet; Refill: 0  6. Obesity, Current BMI 52.8 Tara Kelley is currently in the action stage of change. As such, her goal is to continue with weight loss efforts. She has agreed to the Category 3 Plan and keeping a food journal and adhering to recommended goals of 400-600 calories and 40+ grams of protein at supper daily.   Lean meat substitutions were discussed and handout was given.  Behavioral modification strategies: increasing lean protein intake, meal planning and cooking strategies, and celebration eating strategies.  Tara Kelley has agreed to follow-up with our clinic in 3 to 4 weeks. She was informed of the importance of frequent follow-up visits to maximize her  success with intensive lifestyle modifications for her multiple health conditions.   Tara Kelley was informed we would discuss her lab results at her next visit unless there is a critical issue that needs to be addressed sooner. Tara Kelley agreed to keep her next visit at the agreed upon time to discuss these results.  Objective:   Blood pressure 120/80, pulse 84, temperature 97.8 F (36.6 C), height 5' 2"  (1.575 m),  weight 288 lb (130.6 kg), SpO2 96 %. Body mass index is 52.68 kg/m.  General: Cooperative, alert, well developed, in no acute distress. HEENT: Conjunctivae and lids unremarkable. Cardiovascular: Regular rhythm.  Lungs: Normal work of breathing. Neurologic: No focal deficits.   Lab Results  Component Value Date   CREATININE 0.83 12/21/2021   BUN 10 12/21/2021   NA 136 12/21/2021   K 4.1 12/21/2021   CL 99 12/21/2021   CO2 23 12/21/2021   Lab Results  Component Value Date   ALT 17 12/21/2021   AST 17 12/21/2021   ALKPHOS 106 12/21/2021   BILITOT 0.4 12/21/2021   Lab Results  Component Value Date   HGBA1C 5.4 12/21/2021   HGBA1C 5.4 06/15/2021   Lab Results  Component Value Date   INSULIN 21.5 12/21/2021   INSULIN 20.6 06/15/2021   Lab Results  Component Value Date   TSH 1.270 06/15/2021   Lab Results  Component Value Date   CHOL 219 (H) 12/21/2021   HDL 47 12/21/2021   LDLCALC 147 (H) 12/21/2021   TRIG 141 12/21/2021   CHOLHDL 4.4 06/15/2021   Lab Results  Component Value Date   VD25OH 42.3 12/21/2021   VD25OH 18.9 (L) 06/15/2021   Lab Results  Component Value Date   WBC 8.5 06/15/2021   HGB 14.3 06/15/2021   HCT 43.7 06/15/2021   MCV 92 06/15/2021   PLT 332 06/15/2021   No results found for: "IRON", "TIBC", "FERRITIN"  Attestation Statements:   Reviewed by clinician on day of visit: allergies, medications, problem list, medical history, surgical history, family history, social history, and previous encounter notes.  Time spent on visit including pre-visit chart review and post-visit care and charting was 40 minutes.   I, Trixie Dredge, am acting as transcriptionist for Dennard Nip, MD.  I have reviewed the above documentation for accuracy and completeness, and I agree with the above. -  Dennard Nip, MD

## 2021-12-30 DIAGNOSIS — F418 Other specified anxiety disorders: Secondary | ICD-10-CM | POA: Diagnosis not present

## 2021-12-30 DIAGNOSIS — I10 Essential (primary) hypertension: Secondary | ICD-10-CM | POA: Diagnosis not present

## 2021-12-30 DIAGNOSIS — G4733 Obstructive sleep apnea (adult) (pediatric): Secondary | ICD-10-CM | POA: Diagnosis not present

## 2021-12-30 DIAGNOSIS — E785 Hyperlipidemia, unspecified: Secondary | ICD-10-CM | POA: Diagnosis not present

## 2022-01-08 DIAGNOSIS — S83242A Other tear of medial meniscus, current injury, left knee, initial encounter: Secondary | ICD-10-CM | POA: Diagnosis not present

## 2022-01-10 DIAGNOSIS — S83242D Other tear of medial meniscus, current injury, left knee, subsequent encounter: Secondary | ICD-10-CM | POA: Diagnosis not present

## 2022-01-11 DIAGNOSIS — G4733 Obstructive sleep apnea (adult) (pediatric): Secondary | ICD-10-CM | POA: Diagnosis not present

## 2022-01-18 DIAGNOSIS — L900 Lichen sclerosus et atrophicus: Secondary | ICD-10-CM

## 2022-01-18 DIAGNOSIS — Z1151 Encounter for screening for human papillomavirus (HPV): Secondary | ICD-10-CM | POA: Diagnosis not present

## 2022-01-18 DIAGNOSIS — Z124 Encounter for screening for malignant neoplasm of cervix: Secondary | ICD-10-CM | POA: Diagnosis not present

## 2022-01-18 DIAGNOSIS — N904 Leukoplakia of vulva: Secondary | ICD-10-CM | POA: Insufficient documentation

## 2022-01-18 DIAGNOSIS — Z8619 Personal history of other infectious and parasitic diseases: Secondary | ICD-10-CM | POA: Diagnosis not present

## 2022-01-18 DIAGNOSIS — Z113 Encounter for screening for infections with a predominantly sexual mode of transmission: Secondary | ICD-10-CM | POA: Diagnosis not present

## 2022-01-18 DIAGNOSIS — Z01411 Encounter for gynecological examination (general) (routine) with abnormal findings: Secondary | ICD-10-CM | POA: Diagnosis not present

## 2022-01-18 HISTORY — DX: Lichen sclerosus et atrophicus: L90.0

## 2022-01-18 LAB — HM PAP SMEAR: HM Pap smear: NORMAL

## 2022-01-18 LAB — RESULTS CONSOLE HPV: CHL HPV: NEGATIVE

## 2022-01-19 DIAGNOSIS — F411 Generalized anxiety disorder: Secondary | ICD-10-CM | POA: Diagnosis not present

## 2022-01-20 DIAGNOSIS — M25562 Pain in left knee: Secondary | ICD-10-CM | POA: Diagnosis not present

## 2022-01-21 DIAGNOSIS — Z1231 Encounter for screening mammogram for malignant neoplasm of breast: Secondary | ICD-10-CM | POA: Diagnosis not present

## 2022-01-23 ENCOUNTER — Ambulatory Visit (INDEPENDENT_AMBULATORY_CARE_PROVIDER_SITE_OTHER): Payer: BC Managed Care – PPO | Admitting: Family Medicine

## 2022-01-23 ENCOUNTER — Encounter (INDEPENDENT_AMBULATORY_CARE_PROVIDER_SITE_OTHER): Payer: Self-pay | Admitting: Family Medicine

## 2022-01-23 VITALS — BP 133/85 | HR 83 | Temp 97.5°F | Ht 62.0 in | Wt 291.0 lb

## 2022-01-23 DIAGNOSIS — F39 Unspecified mood [affective] disorder: Secondary | ICD-10-CM

## 2022-01-23 DIAGNOSIS — E669 Obesity, unspecified: Secondary | ICD-10-CM

## 2022-01-23 DIAGNOSIS — Z6841 Body Mass Index (BMI) 40.0 and over, adult: Secondary | ICD-10-CM

## 2022-01-23 DIAGNOSIS — E559 Vitamin D deficiency, unspecified: Secondary | ICD-10-CM | POA: Diagnosis not present

## 2022-01-23 MED ORDER — VITAMIN D (ERGOCALCIFEROL) 1.25 MG (50000 UNIT) PO CAPS: 50000.0000 [IU] | ORAL_CAPSULE | ORAL | 0 refills | Status: DC

## 2022-01-23 MED ORDER — BUPROPION HCL ER (SR) 200 MG PO TB12: 200.0000 mg | ORAL_TABLET | Freq: Two times a day (BID) | ORAL | 0 refills | Status: DC

## 2022-01-25 NOTE — Progress Notes (Unsigned)
     Chief Complaint:   OBESITY Tara Kelley is here to discuss her progress with her obesity treatment plan along with follow-up of her obesity related diagnoses. Tara Kelley is on {MWMwtlossportion/plan2:23431} and states she is following her eating plan approximately ***% of the time. Tara Kelley states she is *** *** minutes *** times per week.  Today's visit was #: *** Starting weight: *** Starting date: *** Today's weight: *** Today's date: 01/23/2022 Total lbs lost to date: *** Total lbs lost since last in-office visit: ***  Interim History: ***  Subjective:   1. Vitamin D deficiency ***  2. 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 Vitamin D deficiency *** - Vitamin D, Ergocalciferol, (DRISDOL) 1.25 MG (50000 UNIT) CAPS capsule; Take 1 capsule (50,000 Units total) by mouth every 7 (seven) days.  Dispense: 4 capsule; Refill: 0  2. 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  Dispense: 60 tablet; Refill: 0  3. Obesity, Current BMI 53.3 Tara Kelley {CHL AMB IS/IS NOT:210130109} currently in the action stage of change. As such, her goal is to {MWMwtloss#1:210800005}. She has agreed to {MWMwtlossportion/plan2:23431}.   Exercise goals: {MWM EXERCISE RECS:23473}  Behavioral modification strategies: {MWMwtlossdietstrategies3:23432}.  Tara Kelley has agreed to follow-up with our clinic in {NUMBER 1-10:22536} weeks. She was informed of the importance of frequent follow-up visits to maximize her success with intensive lifestyle modifications for her multiple health conditions.   Objective:   Blood pressure 133/85, pulse 83, temperature (!) 97.5 F (36.4 C), height 5\' 2"  (1.575 m), weight 291 lb (132 kg), SpO2 97 %. Body mass index is 53.22 kg/m.  General: Cooperative, alert, well developed, in no acute distress. HEENT: Conjunctivae and lids  unremarkable. Cardiovascular: Regular rhythm.  Lungs: Normal work of breathing. Neurologic: No focal deficits.   Lab Results  Component Value Date   CREATININE 0.83 12/21/2021   BUN 10 12/21/2021   NA 136 12/21/2021   K 4.1 12/21/2021   CL 99 12/21/2021   CO2 23 12/21/2021   Lab Results  Component Value Date   ALT 17 12/21/2021   AST 17 12/21/2021   ALKPHOS 106 12/21/2021   BILITOT 0.4 12/21/2021   Lab Results  Component Value Date   HGBA1C 5.4 12/21/2021   HGBA1C 5.4 06/15/2021   Lab Results  Component Value Date   INSULIN 21.5 12/21/2021   INSULIN 20.6 06/15/2021   Lab Results  Component Value Date   TSH 1.270 06/15/2021   Lab Results  Component Value Date   CHOL 219 (H) 12/21/2021   HDL 47 12/21/2021   LDLCALC 147 (H) 12/21/2021   TRIG 141 12/21/2021   CHOLHDL 4.4 06/15/2021   Lab Results  Component Value Date   VD25OH 42.3 12/21/2021   VD25OH 18.9 (L) 06/15/2021   Lab Results  Component Value Date   WBC 8.5 06/15/2021   HGB 14.3 06/15/2021   HCT 43.7 06/15/2021   MCV 92 06/15/2021   PLT 332 06/15/2021   No results found for: "IRON", "TIBC", "FERRITIN"  Attestation Statements:   Reviewed by clinician on day of visit: allergies, medications, problem list, medical history, surgical history, family history, social history, and previous encounter notes.   I, Trixie Dredge, am acting as transcriptionist for Dennard Nip, MD.  I have reviewed the above documentation for accuracy and completeness, and I agree with the above. -  ***

## 2022-01-26 DIAGNOSIS — F411 Generalized anxiety disorder: Secondary | ICD-10-CM | POA: Diagnosis not present

## 2022-01-30 DIAGNOSIS — M23301 Other meniscus derangements, unspecified lateral meniscus, left knee: Secondary | ICD-10-CM | POA: Diagnosis not present

## 2022-02-02 DIAGNOSIS — F411 Generalized anxiety disorder: Secondary | ICD-10-CM | POA: Diagnosis not present

## 2022-02-03 DIAGNOSIS — M23301 Other meniscus derangements, unspecified lateral meniscus, left knee: Secondary | ICD-10-CM | POA: Diagnosis not present

## 2022-02-06 ENCOUNTER — Ambulatory Visit (INDEPENDENT_AMBULATORY_CARE_PROVIDER_SITE_OTHER): Payer: BC Managed Care – PPO | Admitting: Family Medicine

## 2022-02-06 ENCOUNTER — Encounter (INDEPENDENT_AMBULATORY_CARE_PROVIDER_SITE_OTHER): Payer: Self-pay

## 2022-02-06 DIAGNOSIS — M23301 Other meniscus derangements, unspecified lateral meniscus, left knee: Secondary | ICD-10-CM | POA: Diagnosis not present

## 2022-02-07 ENCOUNTER — Telehealth (INDEPENDENT_AMBULATORY_CARE_PROVIDER_SITE_OTHER): Payer: Self-pay | Admitting: Psychology

## 2022-02-07 NOTE — Telephone Encounter (Signed)
  Office: [phone]  /  Fax: [phone]  Date of Call: February 07, 2022  Time of Call: 2:45pm Duration of Call: ~ 2.5 minutes Provider: Glennie Isle, PsyD  CONTENT:  This provider called Tara Kelley to check-in and schedule a follow-up appointment, as Tara Kelley missed the last appointment. She apologized for missing the appointment and explained she will have to wait to schedule a follow-up as she sustained a knee injury and has various appointments with ortho. A brief risk assessment was completed. [place] denied experiencing suicidal, self-harm, and homicidal ideation, plan, or intent since the last appointment with this provider. All questions/concerns addressed.  PLAN: Gionni will call back to schedule a follow-up appointment. No further follow-up planned by this provider.

## 2022-02-08 ENCOUNTER — Ambulatory Visit (INDEPENDENT_AMBULATORY_CARE_PROVIDER_SITE_OTHER): Payer: BC Managed Care – PPO | Admitting: Family Medicine

## 2022-02-09 DIAGNOSIS — F411 Generalized anxiety disorder: Secondary | ICD-10-CM | POA: Diagnosis not present

## 2022-02-15 DIAGNOSIS — G4733 Obstructive sleep apnea (adult) (pediatric): Secondary | ICD-10-CM | POA: Diagnosis not present

## 2022-02-16 DIAGNOSIS — F411 Generalized anxiety disorder: Secondary | ICD-10-CM | POA: Diagnosis not present

## 2022-02-17 ENCOUNTER — Other Ambulatory Visit (INDEPENDENT_AMBULATORY_CARE_PROVIDER_SITE_OTHER): Payer: Self-pay | Admitting: Family Medicine

## 2022-02-17 DIAGNOSIS — F39 Unspecified mood [affective] disorder: Secondary | ICD-10-CM

## 2022-02-17 DIAGNOSIS — M23301 Other meniscus derangements, unspecified lateral meniscus, left knee: Secondary | ICD-10-CM | POA: Diagnosis not present

## 2022-02-20 ENCOUNTER — Ambulatory Visit (INDEPENDENT_AMBULATORY_CARE_PROVIDER_SITE_OTHER): Payer: BC Managed Care – PPO | Admitting: Family Medicine

## 2022-02-20 ENCOUNTER — Encounter (INDEPENDENT_AMBULATORY_CARE_PROVIDER_SITE_OTHER): Payer: Self-pay | Admitting: Family Medicine

## 2022-02-20 VITALS — BP 132/88 | HR 95 | Temp 98.0°F | Ht 62.0 in | Wt 286.0 lb

## 2022-02-20 DIAGNOSIS — E782 Mixed hyperlipidemia: Secondary | ICD-10-CM | POA: Diagnosis not present

## 2022-02-20 DIAGNOSIS — E559 Vitamin D deficiency, unspecified: Secondary | ICD-10-CM

## 2022-02-20 DIAGNOSIS — I10 Essential (primary) hypertension: Secondary | ICD-10-CM

## 2022-02-20 DIAGNOSIS — E88819 Insulin resistance, unspecified: Secondary | ICD-10-CM | POA: Diagnosis not present

## 2022-02-20 DIAGNOSIS — E669 Obesity, unspecified: Secondary | ICD-10-CM

## 2022-02-20 DIAGNOSIS — F39 Unspecified mood [affective] disorder: Secondary | ICD-10-CM

## 2022-02-20 DIAGNOSIS — Z6841 Body Mass Index (BMI) 40.0 and over, adult: Secondary | ICD-10-CM

## 2022-02-20 MED ORDER — BUPROPION HCL ER (SR) 200 MG PO TB12: 200.0000 mg | ORAL_TABLET | Freq: Two times a day (BID) | ORAL | 0 refills | Status: DC

## 2022-02-20 MED ORDER — VITAMIN D (ERGOCALCIFEROL) 1.25 MG (50000 UNIT) PO CAPS: 50000.0000 [IU] | ORAL_CAPSULE | ORAL | 0 refills | Status: DC

## 2022-02-21 LAB — VITAMIN B12: Vitamin B-12: 524 pg/mL (ref 232–1245)

## 2022-02-21 LAB — CMP14+EGFR
ALT: 21 IU/L (ref 0–32)
AST: 22 IU/L (ref 0–40)
Albumin/Globulin Ratio: 1.6 (ref 1.2–2.2)
Albumin: 3.9 g/dL (ref 3.9–4.9)
Alkaline Phosphatase: 92 IU/L (ref 44–121)
BUN/Creatinine Ratio: 12 (ref 9–23)
BUN: 10 mg/dL (ref 6–24)
Bilirubin Total: 0.4 mg/dL (ref 0.0–1.2)
CO2: 23 mmol/L (ref 20–29)
Calcium: 8.8 mg/dL (ref 8.7–10.2)
Chloride: 100 mmol/L (ref 96–106)
Creatinine, Ser: 0.85 mg/dL (ref 0.57–1.00)
Globulin, Total: 2.4 g/dL (ref 1.5–4.5)
Glucose: 85 mg/dL (ref 70–99)
Potassium: 3.6 mmol/L (ref 3.5–5.2)
Sodium: 139 mmol/L (ref 134–144)
Total Protein: 6.3 g/dL (ref 6.0–8.5)
eGFR: 89 mL/min/{1.73_m2} (ref >59)

## 2022-02-21 LAB — VITAMIN D 25 HYDROXY (VIT D DEFICIENCY, FRACTURES): Vit D, 25-Hydroxy: 39.7 ng/mL (ref 30.0–100.0)

## 2022-02-21 LAB — LIPID PANEL WITH LDL/HDL RATIO
Cholesterol, Total: 216 mg/dL — ABNORMAL HIGH (ref 100–199)
HDL: 44 mg/dL (ref >39)
LDL Chol Calc (NIH): 146 mg/dL — ABNORMAL HIGH (ref 0–99)
LDL/HDL Ratio: 3.3 ratio — ABNORMAL HIGH (ref 0.0–3.2)
Triglycerides: 145 mg/dL (ref 0–149)
VLDL Cholesterol Cal: 26 mg/dL (ref 5–40)

## 2022-02-21 LAB — HEMOGLOBIN A1C
Est. average glucose Bld gHb Est-mCnc: 108 mg/dL
Hgb A1c MFr Bld: 5.4 % (ref 4.8–5.6)

## 2022-02-21 LAB — INSULIN, RANDOM: INSULIN: 12.2 u[IU]/mL (ref 2.6–24.9)

## 2022-02-21 LAB — TSH: TSH: 1.61 u[IU]/mL (ref 0.450–4.500)

## 2022-02-24 DIAGNOSIS — M23301 Other meniscus derangements, unspecified lateral meniscus, left knee: Secondary | ICD-10-CM | POA: Diagnosis not present

## 2022-02-26 NOTE — Progress Notes (Unsigned)
Chief Complaint:   OBESITY Tara Kelley is here to discuss her progress with her obesity treatment plan along with follow-up of her obesity related diagnoses. Tara Kelley is on the Category 3 Plan and keeping a food journal and adhering to recommended goals of 400-600 calories and 40+ grams of protein at supper daily and states she is following her eating plan approximately 75% of the time. Tara Kelley states she is doing 0 minutes 0 times per week.  Today's visit was #: 73 Starting weight: 311 lbs Starting date: 06/15/2021 Today's weight: 286 lbs Today's date: 02/20/2022 Total lbs lost to date: 25 Total lbs lost since last in-office visit: 5  Interim History: Tara Kelley continues to do well with weight loss.  She is recovering from a knee injury and she was on steroids, so this is especially impressive.  Subjective:   1. Mixed hyperlipidemia Mee's last LDL was elevated.  She is working on her weight loss and she is due for labs.  2. Vitamin D deficiency Tara Kelley is due for labs, and she is on vitamin D prescription.  3. Primary hypertension Tara Kelley's blood pressure is currently controlled, and she has no side effects with her medications.  4. Insulin resistance Tonji is working on decreasing simple sugars, and she is due for labs.  5. 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  Assessment/Plan:   1. Mixed hyperlipidemia We will check labs today. We discussed several lifestyle modifications today and Tara Kelley will continue to work on diet, exercise and weight loss efforts. Orders and follow up as documented in patient record.   - Lipid Panel With LDL/HDL Ratio  2. Vitamin D deficiency We will check labs today, and we will refill prescription Vitamin D for 1 month. Tara Kelley will follow-up for routine testing of Vitamin D, at least 2-3  times per year to avoid over-replacement.  - Vitamin D, Ergocalciferol, (DRISDOL) 1.25 MG (50000 UNIT) CAPS capsule; Take 1 capsule (50,000 Units total) by mouth every 7 (seven) days.  Dispense: 4 capsule; Refill: 0 - VITAMIN D 25 Hydroxy (Vit-D Deficiency, Fractures)  3. Primary hypertension We will check labs today. Tara Kelley will continue her diet and exercise to improve blood pressure control.  - CMP14+EGFR  4. Insulin resistance We will check labs today. Tara Kelley will continue to work on weight loss, exercise, and decreasing simple carbohydrates to help decrease the risk of diabetes. Tara Kelley agreed to follow-up with Tara Kelley as directed to closely monitor her progress.  - CMP14+EGFR - Vitamin B12 - Insulin, random - Hemoglobin A1c - TSH  5. 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   - buPROPion (WELLBUTRIN SR) 200 MG 12 hr tablet; Take 1 tablet (200 mg total) by mouth 2 (two) times daily.  Dispense: 60 tablet; Refill: 0  6. Obesity, Current BMI 52.4 Tara Kelley is currently in the action stage of change. As such, her goal is to continue with weight loss efforts. She has agreed to the Category 3 Plan and keeping a food journal and adhering to recommended goals of 400-600 calories and 40+ grams of protein at supper daily.   Behavioral modification strategies: increasing lean protein intake and holiday eating strategies .  Tara Kelley has agreed to follow-up with our clinic in 4 weeks. She was informed of the importance of  frequent follow-up visits to maximize her success with intensive lifestyle modifications for her multiple health conditions.   Tara Kelley was informed we would discuss her lab results at her next visit unless there is a critical issue that needs to be addressed sooner. Tara Kelley agreed to keep her next visit at the agreed upon time to discuss these results.  Objective:   Blood pressure 132/88, pulse 95,  temperature 98 F (36.7 C), height _0  (1.575 m), weight 286 lb (129.7 kg), SpO2 96 %. Body mass index is 52.31 kg/m.  General: Cooperative, alert, well developed, in no acute distress. HEENT: Conjunctivae and lids unremarkable. Cardiovascular: Regular rhythm.  Lungs: Normal work of breathing. Neurologic: No focal deficits.   Lab Results  Component Value Date   CREATININE 0.85 02/20/2022   BUN 10 02/20/2022   NA 139 02/20/2022   K 3.6 02/20/2022   CL 100 02/20/2022   CO2 23 02/20/2022   Lab Results  Component Value Date   ALT 21 02/20/2022   AST 22 02/20/2022   ALKPHOS 92 02/20/2022   BILITOT 0.4 02/20/2022   Lab Results  Component Value Date   HGBA1C 5.4 02/20/2022   HGBA1C 5.4 12/21/2021   HGBA1C 5.4 06/15/2021   Lab Results  Component Value Date   INSULIN 12.2 02/20/2022   INSULIN 21.5 12/21/2021   INSULIN 20.6 06/15/2021   Lab Results  Component Value Date   TSH 1.610 02/20/2022   Lab Results  Component Value Date   CHOL 216 (H) 02/20/2022   HDL 44 02/20/2022   LDLCALC 146 (H) 02/20/2022   TRIG 145 02/20/2022   CHOLHDL 4.4 06/15/2021   Lab Results  Component Value Date   VD25OH 39.7 02/20/2022   VD25OH 42.3 12/21/2021   VD25OH 18.9 (L) 06/15/2021   Lab Results  Component Value Date   WBC 8.5 06/15/2021   HGB 14.3 06/15/2021   HCT 43.7 06/15/2021   MCV 92 06/15/2021   PLT 332 06/15/2021   No results found for: "IRON", "TIBC", "FERRITIN"  Attestation Statements:   Reviewed by clinician on day of visit: allergies, medications, problem list, medical history, surgical history, family history, social history, and previous encounter notes.   I, Trixie Dredge, am acting as transcriptionist for Dennard Nip, MD.  I have reviewed the above documentation for accuracy and completeness, and I agree with the above. -  Dennard Nip, MD

## 2022-03-09 ENCOUNTER — Ambulatory Visit (INDEPENDENT_AMBULATORY_CARE_PROVIDER_SITE_OTHER): Payer: BC Managed Care – PPO | Admitting: Family Medicine

## 2022-03-09 ENCOUNTER — Ambulatory Visit (INDEPENDENT_AMBULATORY_CARE_PROVIDER_SITE_OTHER): Payer: BC Managed Care – PPO

## 2022-03-09 ENCOUNTER — Ambulatory Visit: Payer: Self-pay

## 2022-03-09 ENCOUNTER — Encounter (INDEPENDENT_AMBULATORY_CARE_PROVIDER_SITE_OTHER): Payer: Self-pay | Admitting: Family Medicine

## 2022-03-09 VITALS — BP 138/90 | HR 99 | Ht 62.0 in | Wt 289.0 lb

## 2022-03-09 VITALS — BP 138/90 | HR 99 | Temp 98.1°F | Ht 62.0 in | Wt 289.4 lb

## 2022-03-09 DIAGNOSIS — I10 Essential (primary) hypertension: Secondary | ICD-10-CM

## 2022-03-09 DIAGNOSIS — G8929 Other chronic pain: Secondary | ICD-10-CM | POA: Diagnosis not present

## 2022-03-09 DIAGNOSIS — M25562 Pain in left knee: Secondary | ICD-10-CM | POA: Diagnosis not present

## 2022-03-09 DIAGNOSIS — E669 Obesity, unspecified: Secondary | ICD-10-CM

## 2022-03-09 DIAGNOSIS — E559 Vitamin D deficiency, unspecified: Secondary | ICD-10-CM | POA: Diagnosis not present

## 2022-03-09 DIAGNOSIS — F39 Unspecified mood [affective] disorder: Secondary | ICD-10-CM

## 2022-03-09 DIAGNOSIS — Z6841 Body Mass Index (BMI) 40.0 and over, adult: Secondary | ICD-10-CM

## 2022-03-09 DIAGNOSIS — F411 Generalized anxiety disorder: Secondary | ICD-10-CM | POA: Diagnosis not present

## 2022-03-09 MED ORDER — VITAMIN D (ERGOCALCIFEROL) 1.25 MG (50000 UNIT) PO CAPS: 50000.0000 [IU] | ORAL_CAPSULE | ORAL | 0 refills | Status: DC

## 2022-03-09 MED ORDER — BUPROPION HCL ER (SR) 200 MG PO TB12: 200.0000 mg | ORAL_TABLET | Freq: Two times a day (BID) | ORAL | 0 refills | Status: DC

## 2022-03-09 NOTE — Progress Notes (Addendum)
I, Philbert Riser, LAT, ATC acting as a scribe for Clementeen Graham, MD.  Subjective:    CC: Left knee pain  HPI: Patient is a 41 year old female presenting with left knee pain ongoing since 9/1. Pt slipped on the wet floor, from the dog's water bowl, and felt a "pull" in her L knee. Pt was seen a week later at the [facility] and then had f/u visits at their office. Pt was doing steps at PT and heard a "crack" in her L knee. Patient locates pain to the anterior aspect of the L knee.   She had an MRI of her knee following the original injury on September 22 that showed some fraying of the medial meniscus without significant tear of the medial meniscus.  Subsequent to the MRI she had a steroid injection and then had a trial of physical therapy.  In early October about a month ago while doing weightbearing knee loading activities and physical therapy she felt a sudden pain and a pop in her medial knee worsening the pain.  She has been struggling since then.  L Knee swelling: yes Mechanical symptoms: yes Aggravates: walking, kneeling, knee flexion, in/out of car Treatments tried: naproxen, oral diclofenac, steroid injection, PT, prednisone  Pertinent review of Systems: No fevers or chills  Relevant historical information: Hypertension and obesity   Objective:    Vitals:   03/09/22 1114  BP: (!) 138/90  Pulse: 99  SpO2: 97%   General: Well Developed, well nourished, and in no acute distress.   MSK: Left knee: Mild effusion otherwise normal. Normal motion. Tender palpation medial joint line. Positive medial McMurray's test. Intact strength. Stable ligamentous exam. Antalgic gait  Lab and Radiology Results  Diagnostic Limited MSK Ultrasound of: Left knee Quad tendon intact normal-appearing Moderate joint effusion is present. Patellar tendon is normal. Medial meniscus partially visible however at the mid to posterior joint line the medial meniscus is partially extruded  from the joint indicating probable meniscus tear. Lateral joint line normal. Posterior knee no Baker's cyst. Impression: Concern medial meniscus tear.    X-ray images left knee obtained today personally and independently interpreted Mild medial DJD.  No acute fractures are present. Await formal radiology review    EXAM: MRI OF THE LEFT KNEE WITHOUT CONTRAST  TECHNIQUE: Multiplanar, multisequence MR imaging of the knee was performed. No intravenous contrast was administered.  COMPARISON: None Available.  FINDINGS: MENISCI  Medial meniscus: Fraying of the midbody and posterior root free edges without discrete tear.  Lateral meniscus: Intact.  LIGAMENTS  Cruciates: Intact ACL and PCL. 3.1 cm ACL ganglion cyst.  Collaterals: Medial collateral ligament is intact. Lateral collateral ligament complex is intact.  CARTILAGE  Patellofemoral: No chondral defect.  Medial: No chondral defect.  Lateral: No chondral defect.  Joint: No joint effusion. Normal Hoffa's fat. No plical thickening.  Popliteal Fossa: No Baker cyst. Intact popliteus tendon.  Extensor Mechanism: Intact quadriceps tendon and patellar tendon. Intact medial and lateral patellar retinaculum. Intact MPFL.  Bones: No focal marrow signal abnormality. No fracture or dislocation.  Other: None.  IMPRESSION: 1. Fraying of the medial meniscus midbody and posterior root free edges without discrete tear. 2. 3.1 cm ACL ganglion cyst.   Electronically Signed By: Obie Dredge M.D. On: 01/20/2022 08:03  I, Clementeen Graham, personally (independently) visualized and performed the interpretation of the images attached in this note.     Impression and Recommendations:    Assessment and Plan: 41 y.o. female with left  knee pain thought to be due to a new meniscus tear since the MRI September 22.  The timing is unfortunate since she is already had an MRI of her knee recently.  However she describes a second  injury to her knee occurring in physical therapy that is consistent with a description of a meniscus tear and has a physical exam in clinic today that is consistent with meniscus tear.  Ultrasound examination in clinic today also shows an extruded medial meniscus which is concerning for a new meniscus tear.  Plan for multiple treatment options step approach today.  We will obtain x-ray now to confirm no no fracture such as a tibial plateau fracture which would also explain her pain. We will obtain a new MRI of the left knee.  Again as above I am concerned that she has developed a meniscus tear since her MRI September 22.  Also we will work on authorization now for [employer] injection and hyaluronic acid injections.  If MRI does not show an acute meniscus tear these may be reasonable choices prior to surgery.  It be helpful to have him authorized and ready to go after the MRI is read.  Lastly we will start the process for aquatic physical therapy.  She did not tolerate conventional physical therapy and having aquatic physical therapy lined up and ready to go as well should be helpful.  There is about a 1 month wait right now to get into aquatic physical therapy so I want to have that referral and process while we are waiting on more definitive evaluation with MRI..  OF NOTE per my office staff that does prior auths: [employer] does not cover gel or zilretta injections.       PDMP not reviewed this encounter. Orders Placed This Encounter  Procedures   [place] LIMITED JOINT SPACE STRUCTURES LOW LEFT(NO LINKED CHARGES)    Order Specific Question:   Reason for Exam (SYMPTOM  OR DIAGNOSIS REQUIRED)    Answer:   left knee pain    Order Specific Question:   Preferred imaging location?    Answer:   [facility] Knee AP/LAT W/[place] Left    Standing Status:   Future    Number of Occurrences:   1    Standing Expiration Date:   03/10/2023    Order Specific Question:   Reason for  Exam (SYMPTOM  OR DIAGNOSIS REQUIRED)    Answer:   eval knee pain    Order Specific Question:   Is patient pregnant?    Answer:   No    Order Specific Question:   Preferred imaging location?    Answer:   [facility]   MR Knee Left  Wo Contrast    Standing Status:   Future    Standing Expiration Date:   03/10/2023    Order Specific Question:   What is the patient's sedation requirement?    Answer:   Anti-anxiety    Order Specific Question:   Does the patient have a pacemaker or implanted devices?    Answer:   No    Order Specific Question:   Preferred imaging location?    Answer:   [facility] (table limit-350lbs)   Ambulatory referral to Physical Therapy    Referral Priority:   Routine    Referral Type:   Physical Medicine    Referral Reason:   Specialty Services Required    Requested Specialty:   Physical Therapy  Number of Visits Requested:   1   No orders of the defined types were placed in this encounter.   Discussed warning signs or symptoms. Please see discharge instructions. Patient expresses understanding.   The above documentation has been reviewed and is accurate and complete Lynne Leader, M.D.

## 2022-03-09 NOTE — Patient Instructions (Addendum)
Thank you for coming in today.   Please get an Xray today before you leave   You should hear from MRI scheduling within 1 week. If you do not hear please let me know.    I've referred you to Physical Therapy.  Let us know if you don't hear from them in one week.   Typically recheck following MRI especially once we get the Zilretta and or Gel shots approved.

## 2022-03-13 ENCOUNTER — Telehealth: Payer: Self-pay | Admitting: *Deleted

## 2022-03-13 NOTE — Telephone Encounter (Signed)
[employer] does not cover gel or zilretta injections.

## 2022-03-13 NOTE — Progress Notes (Signed)
Left knee x-ray looks normal to radiology

## 2022-03-13 NOTE — Telephone Encounter (Signed)
-----   Message from Rodolph Bong, MD sent at 03/09/2022 11:43 AM EST ----- Regarding: Zilretta and gel shots Please auth both zilretta and gel shots left knee

## 2022-03-16 ENCOUNTER — Encounter: Payer: Self-pay | Admitting: Family Medicine

## 2022-03-16 DIAGNOSIS — G8929 Other chronic pain: Secondary | ICD-10-CM

## 2022-03-16 DIAGNOSIS — M25562 Pain in left knee: Secondary | ICD-10-CM

## 2022-03-21 ENCOUNTER — Telehealth: Payer: Self-pay | Admitting: Family Medicine

## 2022-03-21 ENCOUNTER — Ambulatory Visit (INDEPENDENT_AMBULATORY_CARE_PROVIDER_SITE_OTHER): Payer: BC Managed Care – PPO

## 2022-03-21 DIAGNOSIS — S83232A Complex tear of medial meniscus, current injury, left knee, initial encounter: Secondary | ICD-10-CM | POA: Diagnosis not present

## 2022-03-21 DIAGNOSIS — G8929 Other chronic pain: Secondary | ICD-10-CM | POA: Diagnosis not present

## 2022-03-21 DIAGNOSIS — M25562 Pain in left knee: Secondary | ICD-10-CM | POA: Diagnosis not present

## 2022-03-21 DIAGNOSIS — M25462 Effusion, left knee: Secondary | ICD-10-CM | POA: Diagnosis not present

## 2022-03-21 DIAGNOSIS — F411 Generalized anxiety disorder: Secondary | ICD-10-CM | POA: Diagnosis not present

## 2022-03-21 NOTE — Telephone Encounter (Signed)
MRI approved.  Auth Number: [id number] Valid from Nov 13 through Dec 12

## 2022-03-22 NOTE — Progress Notes (Signed)
Chief Complaint:   OBESITY Tara Kelley is here to discuss her progress with her obesity treatment plan along with follow-up of her obesity related diagnoses. Tara Kelley is on the Category 3 Plan and keeping a food journal and adhering to recommended goals of 400-600 calories and 40+ grams of protein daily and states she is following her eating plan approximately 50% of the time. Tara Kelley states she is doing 0 minutes 0 times per week.  Today's visit was #: 14 Starting weight: 311 lbs Starting date: 06/15/2021 Today's weight: 289 lbs Today's date: 03/09/2022 Total lbs lost to date: 22 Total lbs lost since last in-office visit: 0  Interim History: Tara Kelley re-injured her knee in physical therapy and she is having trouble getting around. She was treated with steroids. She is struggling to stay on the plan.   Subjective:   1. Vitamin D deficiency Tara Kelley is taking Vitamin D prescription with no side effects noted.   2. Acute pain of left knee Tara Kelley has had difficulty with recovering following a recent injury. She is taking Advil, and she has had steroids oral and steroid intra-articular injection.   3. Hypertension, essential Tara Kelley is taking Cozaar and hydrochlorothiazide with no side effects noted. Her blood pressure is elevated today, but she is having increased knee pain today.   4. Mood disorder ([facility]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 Tara Kelley is taking Wellbutrin, and she plans to follow up with Dr. Dewaine Conger. She is still struggling with cravings.   Assessment/Plan:   1. Vitamin D deficiency Tara Kelley will continue prescription Vitamin D 50,000 IU every week, and we will refill for 1 month. She will follow-up for routine testing of Vitamin D, at least 2-3 times per year to avoid over-replacement.  - Vitamin D, Ergocalciferol, (DRISDOL) 1.25 MG (50000 UNIT) CAPS capsule; Take 1 capsule (50,000 Units total) by mouth every 7 (seven) days.  Dispense: 4 capsule; Refill: 0  2. Acute pain of left knee Tara Kelley  was referred to Dr. Clementeen Graham, Sports Medicine for evaluation.   - Ambulatory referral to Sports Medicine  3. Hypertension, essential We will continue to monitor her blood pressure on her current medications. Tara Kelley will continue to work on her weight loss with diet and exercise.   4. Mood disorder ([facility]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 Tara Kelley will continue Wellbutrin SR 200 mg BID, and we will refill for 1 month.   - buPROPion (WELLBUTRIN SR) 200 MG 12 hr tablet; Take 1 tablet (200 mg total) by mouth 2 (two) times daily.  Dispense: 60 tablet; Refill: 0  5. Obesity, Current BMI 52.9 Tara Kelley is currently in the action stage of change. As such, her goal is to continue with weight loss efforts. She has agreed to the Category 3 Plan and keeping a food journal and adhering to recommended goals of 400-600 calories and 40+ grams of protein daily.   Exercise goals: No exercise has been prescribed at this time.  Behavioral modification strategies: increasing lean protein intake, decreasing simple carbohydrates, meal planning and cooking strategies, and holiday eating strategies .  Tara Kelley has agreed to follow-up with our clinic in 4 weeks. She was informed of the importance of frequent follow-up visits to maximize her success with intensive lifestyle modifications for her multiple health conditions.   Objective:   Blood pressure (!) 138/90, pulse 99, temperature 98.1 F (36.7 C), height 5\' 2"  (1.575 m), weight 289 lb 6.4 oz (131.3 kg), SpO2 97 %. Body mass index is 52.93 kg/m.  General: Cooperative, alert, well developed, in  no acute distress. HEENT: Conjunctivae and lids unremarkable. Cardiovascular: Regular rhythm.  Lungs: Normal work of breathing. Neurologic: No focal deficits.   Lab Results  Component Value Date   CREATININE 0.85 02/20/2022   BUN 10 02/20/2022   NA 139 02/20/2022   K 3.6 02/20/2022   CL 100 02/20/2022   CO2 23 02/20/2022   Lab Results  Component Value Date   ALT 21  02/20/2022   AST 22 02/20/2022   ALKPHOS 92 02/20/2022   BILITOT 0.4 02/20/2022   Lab Results  Component Value Date   HGBA1C 5.4 02/20/2022   HGBA1C 5.4 12/21/2021   HGBA1C 5.4 06/15/2021   Lab Results  Component Value Date   INSULIN 12.2 02/20/2022   INSULIN 21.5 12/21/2021   INSULIN 20.6 06/15/2021   Lab Results  Component Value Date   TSH 1.610 02/20/2022   Lab Results  Component Value Date   CHOL 216 (H) 02/20/2022   HDL 44 02/20/2022   LDLCALC 146 (H) 02/20/2022   TRIG 145 02/20/2022   CHOLHDL 4.4 06/15/2021   Lab Results  Component Value Date   VD25OH 39.7 02/20/2022   VD25OH 42.3 12/21/2021   VD25OH 18.9 (L) 06/15/2021   Lab Results  Component Value Date   WBC 8.5 06/15/2021   HGB 14.3 06/15/2021   HCT 43.7 06/15/2021   MCV 92 06/15/2021   PLT 332 06/15/2021   No results found for: "IRON", "TIBC", "FERRITIN"  Attestation Statements:   Reviewed by clinician on day of visit: allergies, medications, problem list, medical history, surgical history, family history, social history, and previous encounter notes.   I, Burt Knack, am acting as transcriptionist for Quillian Quince, MD.  I have reviewed the above documentation for accuracy and completeness, and I agree with the above. -  Quillian Quince, MD

## 2022-03-24 DIAGNOSIS — F411 Generalized anxiety disorder: Secondary | ICD-10-CM | POA: Diagnosis not present

## 2022-03-27 NOTE — Progress Notes (Signed)
New or repeat MRI shows a complex meniscus tear.  I would like to have you see a orthopedic surgeon for this.  Would like to go back to [facility] you would like to see someone else?

## 2022-03-29 ENCOUNTER — Ambulatory Visit (INDEPENDENT_AMBULATORY_CARE_PROVIDER_SITE_OTHER): Payer: BC Managed Care – PPO | Admitting: Orthopaedic Surgery

## 2022-03-29 ENCOUNTER — Ambulatory Visit (HOSPITAL_BASED_OUTPATIENT_CLINIC_OR_DEPARTMENT_OTHER): Payer: Self-pay | Admitting: Orthopaedic Surgery

## 2022-03-29 ENCOUNTER — Ambulatory Visit (INDEPENDENT_AMBULATORY_CARE_PROVIDER_SITE_OTHER): Payer: BC Managed Care – PPO | Admitting: Family Medicine

## 2022-03-29 ENCOUNTER — Encounter (INDEPENDENT_AMBULATORY_CARE_PROVIDER_SITE_OTHER): Payer: Self-pay | Admitting: Family Medicine

## 2022-03-29 ENCOUNTER — Other Ambulatory Visit (HOSPITAL_BASED_OUTPATIENT_CLINIC_OR_DEPARTMENT_OTHER): Payer: Self-pay

## 2022-03-29 ENCOUNTER — Encounter (HOSPITAL_BASED_OUTPATIENT_CLINIC_OR_DEPARTMENT_OTHER): Payer: Self-pay | Admitting: Orthopaedic Surgery

## 2022-03-29 VITALS — BP 138/76 | HR 97 | Ht 62.0 in | Wt 290.0 lb

## 2022-03-29 DIAGNOSIS — E559 Vitamin D deficiency, unspecified: Secondary | ICD-10-CM | POA: Diagnosis not present

## 2022-03-29 DIAGNOSIS — E669 Obesity, unspecified: Secondary | ICD-10-CM

## 2022-03-29 DIAGNOSIS — Z6841 Body Mass Index (BMI) 40.0 and over, adult: Secondary | ICD-10-CM

## 2022-03-29 DIAGNOSIS — M23322 Other meniscus derangements, posterior horn of medial meniscus, left knee: Secondary | ICD-10-CM

## 2022-03-29 DIAGNOSIS — I10 Essential (primary) hypertension: Secondary | ICD-10-CM

## 2022-03-29 MED ORDER — ASPIRIN 325 MG PO TBEC
325.0000 mg | DELAYED_RELEASE_TABLET | Freq: Every day | ORAL | 0 refills | Status: DC
  Filled 2022-03-29: qty 30, 30d supply, fill #0

## 2022-03-29 MED ORDER — IBUPROFEN 800 MG PO TABS
800.0000 mg | ORAL_TABLET | Freq: Three times a day (TID) | ORAL | 0 refills | Status: AC
  Filled 2022-03-29: qty 30, 10d supply, fill #0

## 2022-03-29 MED ORDER — VITAMIN D (ERGOCALCIFEROL) 1.25 MG (50000 UNIT) PO CAPS: 50000.0000 [IU] | ORAL_CAPSULE | ORAL | 0 refills | Status: DC

## 2022-03-29 MED ORDER — OXYCODONE HCL 5 MG PO TABS
5.0000 mg | ORAL_TABLET | ORAL | 0 refills | Status: DC | PRN
  Filled 2022-03-29: qty 20, 4d supply, fill #0

## 2022-03-29 MED ORDER — ACETAMINOPHEN 500 MG PO TABS
500.0000 mg | ORAL_TABLET | Freq: Three times a day (TID) | ORAL | 0 refills | Status: AC
  Filled 2022-03-29: qty 30, 10d supply, fill #0

## 2022-03-29 NOTE — Progress Notes (Signed)
Chief Complaint: Left knee injury     History of Present Illness:    Tara Kelley is a 41 y.o. female presents today for ongoing treatment of her left knee.  She has previously been seen by Dr. Georgina Snell and referred here for a medial meniscal injury.  Of note she does have a history of multiple injuries including on Labor Day when she slipped on a dog bowl water.  She subsequently was seen at [facility].  Physical therapy was initiated and she did subsequently feel an additional pop in September during physical therapy.  Since that time she has been taking Aleve as well as using Voltaren.  She did have 1 injection which gave transient relief.  She is continue to work on physical therapy although is having significant pain with most activities of daily exercise and going up steps.  She does enjoy doing dog competitions with difficulty procedures although this is extremely limited as result of her knee injury    Surgical History:   none  PMH/PSH/Family History/Social History/Meds/Allergies:    Past Medical History:  Diagnosis Date   Allergies    Anxiety    Depression    Fatigue    Headache(784.0)    Hyperlipidemia    Hypertension    Sinus problem    Sleep apnea    SOB (shortness of breath) on exertion    Past Surgical History:  Procedure Laterality Date   APPENDECTOMY     cyst rupture     Ovanrian cyst rupture   EYE SURGERY N/A 2015   Social History   Socioeconomic History   Marital status: Divorced    Spouse name: Not on file   Number of children: Not on file   Years of education: Not on file   Highest education level: Not on file  Occupational History   Not on file  Tobacco Use   Smoking status: Former    Types: Cigarettes    Quit date: 2001    Years since quitting: 22.9   Smokeless tobacco: Not on file  Vaping Use   Vaping Use: Never used  Substance and Sexual Activity   Alcohol use: Yes    Comment: rarely   Drug use:  No   Sexual activity: Not on file  Other Topics Concern   Not on file  Social History Narrative   Not on file   Social Determinants of Health   Financial Resource Strain: Not on file  Food Insecurity: Not on file  Transportation Needs: Not on file  Physical Activity: Not on file  Stress: Not on file  Social Connections: Not on file   Family History  Problem Relation Age of Onset   Hyperlipidemia Mother    Hypertension Mother    Diabetes Mother    Depression Mother    Anxiety disorder Mother    Obesity Mother    Depression Father    Hyperlipidemia Father    Hypertension Father    Sleep apnea Father    Allergies  Allergen Reactions   Wasp Venom Anaphylaxis   Codeine     GI issues   Sulfa Antibiotics     Other reaction(s): Unknown Other reaction(s): vomitting   Sulfa Drugs Cross Reactors    Current Outpatient Medications  Medication Sig Dispense Refill   acetaminophen (TYLENOL) 500  MG tablet Take 1 tablet (500 mg total) by mouth every 8 (eight) hours for 10 days. 30 tablet 0   aspirin EC 325 MG tablet Take 1 tablet (325 mg total) by mouth daily. 30 tablet 0   ibuprofen (ADVIL) 800 MG tablet Take 1 tablet (800 mg total) by mouth every 8 (eight) hours for 10 days. Please take with food, please alternate with acetaminophen 30 tablet 0   oxyCODONE (OXY IR/ROXICODONE) 5 MG immediate release tablet Take 1 tablet (5 mg total) by mouth every 4 (four) hours as needed (severe pain). 20 tablet 0   ALPRAZolam (XANAX) 0.5 MG tablet 1 tablet     buPROPion (WELLBUTRIN SR) 200 MG 12 hr tablet Take 1 tablet (200 mg total) by mouth 2 (two) times daily. 60 tablet 0   citalopram (CELEXA) 20 MG tablet Take 20 mg by mouth at bedtime.     EPINEPHrine 0.3 mg/0.3 mL IJ SOAJ injection See admin instructions.     hydrochlorothiazide (HYDRODIURIL) 25 MG tablet Take 25 mg by mouth daily.     loratadine (CLARITIN) 10 MG tablet Take 10 mg by mouth daily as needed for allergies (allergies).      losartan (COZAAR) 100 MG tablet TAKE 1 TABLET BY MOUTH ONCE DAILY 15 tablet 0   Multiple Vitamin (MULTIVITAMIN) tablet Take 1 tablet by mouth daily.     ondansetron (ZOFRAN ODT) 8 MG disintegrating tablet Take 1 tablet (8 mg total) by mouth every 8 (eight) hours as needed for nausea or vomiting. 20 tablet 0   Vitamin D, Ergocalciferol, (DRISDOL) 1.25 MG (50000 UNIT) CAPS capsule Take 1 capsule (50,000 Units total) by mouth every 7 (seven) days. 4 capsule 0   No current facility-administered medications for this visit.   No results found.  Review of Systems:   A ROS was performed including pertinent positives and negatives as documented in the HPI.  Physical Exam :   Constitutional: NAD and appears stated age Neurological: Alert and oriented Psych: Appropriate affect and cooperative Last menstrual period 02/08/2022.   Comprehensive Musculoskeletal Exam:      Musculoskeletal Exam  Gait Normal  Alignment Normal   Right Left  Inspection Normal Normal  Palpation    Tenderness None Medial joint line  Crepitus None None  Effusion None Positive  Range of Motion    Extension 0 0  Flexion 135 90 with pain  Strength    Extension 5/5 5/5  Flexion 5/5 5/5  Ligament Exam     Generalized Laxity No No  Lachman Negative Negative   Pivot Shift Negative Negative  Anterior Drawer Negative Negative  Valgus at 0 Negative Negative  Valgus at 20 Negative Negative  Varus at 0 0 0  Varus at 20   0 0  Posterior Drawer at 90 0 0  Vascular/Lymphatic Exam    Edema None None  Venous Stasis Changes No No  Distal Circulation Normal Normal  Neurologic    Light Touch Sensation Intact Intact  Special Tests: Positive medial McMurray left knee     Imaging:   Xray (3 views left knee): Normal  MRI (left knee): There is a medial meniscal tear at the posterior root with extrusion into the gutter  I personally reviewed and interpreted the radiographs.   Assessment:   41 y.o. female with a  medial meniscal root tear with extrusion into the gutter after an injury at physical therapy in September of this year.  Overall I did discuss treatment options with her regarding   her meniscal root injury.  Given the fact that this is essentially a nonfunctional meniscus I did describe with her in the literature that patients with this injury are more likely to progress to arthritis if left untreated.  As result we did discuss arthroscopic intervention.  I did discuss the rehab process associated with the repair.  I did discuss specific complications associated with knee arthroscopy and meniscal repair.  After full discussion she would like to proceed with this  Plan :    -Plan for left knee arthroscopy with medial meniscal repair    After a lengthy discussion of treatment options, including risks, benefits, alternatives, complications of surgical and nonsurgical conservative options, the patient elected surgical repair.   The patient  is aware of the material risks  and complications including, but not limited to injury to adjacent structures, neurovascular injury, infection, numbness, bleeding, implant failure, thermal burns, stiffness, persistent pain, failure to heal, disease transmission from allograft, need for further surgery, dislocation, anesthetic risks, blood clots, risks of death,and others. The probabilities of surgical success and failure discussed with patient given their particular co-morbidities.The time and nature of expected rehabilitation and recovery was discussed.The patient's questions were all answered preoperatively.  No barriers to understanding were noted. I explained the natural history of the disease process and Rx rationale.  I explained to the patient what I considered to be reasonable expectations given their personal situation.  The final treatment plan was arrived at through a shared patient decision making process model.      I personally saw and evaluated the  patient, and participated in the management and treatment plan.  Huel Cote, MD Attending Physician, Orthopedic Surgery  This document was dictated using Dragon voice recognition software. A reasonable attempt at proof reading has been made to minimize errors.

## 2022-03-29 NOTE — H&P (View-Only) (Signed)
Chief Complaint: Left knee injury     History of Present Illness:    Tara Kelley is a 41 y.o. female presents today for ongoing treatment of her left knee.  She has previously been seen by Dr. Georgina Snell and referred here for a medial meniscal injury.  Of note she does have a history of multiple injuries including on Labor Day when she slipped on a dog bowl water.  She subsequently was seen at [facility].  Physical therapy was initiated and she did subsequently feel an additional pop in September during physical therapy.  Since that time she has been taking Aleve as well as using Voltaren.  She did have 1 injection which gave transient relief.  She is continue to work on physical therapy although is having significant pain with most activities of daily exercise and going up steps.  She does enjoy doing dog competitions with difficulty procedures although this is extremely limited as result of her knee injury    Surgical History:   none  PMH/PSH/Family History/Social History/Meds/Allergies:    Past Medical History:  Diagnosis Date   Allergies    Anxiety    Depression    Fatigue    Headache(784.0)    Hyperlipidemia    Hypertension    Sinus problem    Sleep apnea    SOB (shortness of breath) on exertion    Past Surgical History:  Procedure Laterality Date   APPENDECTOMY     cyst rupture     Ovanrian cyst rupture   EYE SURGERY N/A 2015   Social History   Socioeconomic History   Marital status: Divorced    Spouse name: Not on file   Number of children: Not on file   Years of education: Not on file   Highest education level: Not on file  Occupational History   Not on file  Tobacco Use   Smoking status: Former    Types: Cigarettes    Quit date: 2001    Years since quitting: 22.9   Smokeless tobacco: Not on file  Vaping Use   Vaping Use: Never used  Substance and Sexual Activity   Alcohol use: Yes    Comment: rarely   Drug use:  No   Sexual activity: Not on file  Other Topics Concern   Not on file  Social History Narrative   Not on file   Social Determinants of Health   Financial Resource Strain: Not on file  Food Insecurity: Not on file  Transportation Needs: Not on file  Physical Activity: Not on file  Stress: Not on file  Social Connections: Not on file   Family History  Problem Relation Age of Onset   Hyperlipidemia Mother    Hypertension Mother    Diabetes Mother    Depression Mother    Anxiety disorder Mother    Obesity Mother    Depression Father    Hyperlipidemia Father    Hypertension Father    Sleep apnea Father    Allergies  Allergen Reactions   Wasp Venom Anaphylaxis   Codeine     GI issues   Sulfa Antibiotics     Other reaction(s): Unknown Other reaction(s): vomitting   Sulfa Drugs Cross Reactors    Current Outpatient Medications  Medication Sig Dispense Refill   acetaminophen (TYLENOL) 500  MG tablet Take 1 tablet (500 mg total) by mouth every 8 (eight) hours for 10 days. 30 tablet 0   aspirin EC 325 MG tablet Take 1 tablet (325 mg total) by mouth daily. 30 tablet 0   ibuprofen (ADVIL) 800 MG tablet Take 1 tablet (800 mg total) by mouth every 8 (eight) hours for 10 days. Please take with food, please alternate with acetaminophen 30 tablet 0   oxyCODONE (OXY IR/ROXICODONE) 5 MG immediate release tablet Take 1 tablet (5 mg total) by mouth every 4 (four) hours as needed (severe pain). 20 tablet 0   ALPRAZolam (XANAX) 0.5 MG tablet 1 tablet     buPROPion (WELLBUTRIN SR) 200 MG 12 hr tablet Take 1 tablet (200 mg total) by mouth 2 (two) times daily. 60 tablet 0   citalopram (CELEXA) 20 MG tablet Take 20 mg by mouth at bedtime.     EPINEPHrine 0.3 mg/0.3 mL IJ SOAJ injection See admin instructions.     hydrochlorothiazide (HYDRODIURIL) 25 MG tablet Take 25 mg by mouth daily.     loratadine (CLARITIN) 10 MG tablet Take 10 mg by mouth daily as needed for allergies (allergies).      losartan (COZAAR) 100 MG tablet TAKE 1 TABLET BY MOUTH ONCE DAILY 15 tablet 0   Multiple Vitamin (MULTIVITAMIN) tablet Take 1 tablet by mouth daily.     ondansetron (ZOFRAN ODT) 8 MG disintegrating tablet Take 1 tablet (8 mg total) by mouth every 8 (eight) hours as needed for nausea or vomiting. 20 tablet 0   Vitamin D, Ergocalciferol, (DRISDOL) 1.25 MG (50000 UNIT) CAPS capsule Take 1 capsule (50,000 Units total) by mouth every 7 (seven) days. 4 capsule 0   No current facility-administered medications for this visit.   No results found.  Review of Systems:   A ROS was performed including pertinent positives and negatives as documented in the HPI.  Physical Exam :   Constitutional: NAD and appears stated age Neurological: Alert and oriented Psych: Appropriate affect and cooperative Last menstrual period 02/08/2022.   Comprehensive Musculoskeletal Exam:      Musculoskeletal Exam  Gait Normal  Alignment Normal   Right Left  Inspection Normal Normal  Palpation    Tenderness None Medial joint line  Crepitus None None  Effusion None Positive  Range of Motion    Extension 0 0  Flexion 135 90 with pain  Strength    Extension 5/5 5/5  Flexion 5/5 5/5  Ligament Exam     Generalized Laxity No No  Lachman Negative Negative   Pivot Shift Negative Negative  Anterior Drawer Negative Negative  Valgus at 0 Negative Negative  Valgus at 20 Negative Negative  Varus at 0 0 0  Varus at 20   0 0  Posterior Drawer at 90 0 0  Vascular/Lymphatic Exam    Edema None None  Venous Stasis Changes No No  Distal Circulation Normal Normal  Neurologic    Light Touch Sensation Intact Intact  Special Tests: Positive medial McMurray left knee     Imaging:   Xray (3 views left knee): Normal  MRI (left knee): There is a medial meniscal tear at the posterior root with extrusion into the gutter  I personally reviewed and interpreted the radiographs.   Assessment:   41 y.o. female with a  medial meniscal root tear with extrusion into the gutter after an injury at physical therapy in September of this year.  Overall I did discuss treatment options with her regarding  her meniscal root injury.  Given the fact that this is essentially a nonfunctional meniscus I did describe with her in the literature that patients with this injury are more likely to progress to arthritis if left untreated.  As result we did discuss arthroscopic intervention.  I did discuss the rehab process associated with the repair.  I did discuss specific complications associated with knee arthroscopy and meniscal repair.  After full discussion she would like to proceed with this  Plan :    -Plan for left knee arthroscopy with medial meniscal repair    After a lengthy discussion of treatment options, including risks, benefits, alternatives, complications of surgical and nonsurgical conservative options, the patient elected surgical repair.   The patient  is aware of the material risks  and complications including, but not limited to injury to adjacent structures, neurovascular injury, infection, numbness, bleeding, implant failure, thermal burns, stiffness, persistent pain, failure to heal, disease transmission from allograft, need for further surgery, dislocation, anesthetic risks, blood clots, risks of death,and others. The probabilities of surgical success and failure discussed with patient given their particular co-morbidities.The time and nature of expected rehabilitation and recovery was discussed.The patient's questions were all answered preoperatively.  No barriers to understanding were noted. I explained the natural history of the disease process and Rx rationale.  I explained to the patient what I considered to be reasonable expectations given their personal situation.  The final treatment plan was arrived at through a shared patient decision making process model.      I personally saw and evaluated the  patient, and participated in the management and treatment plan.  Huel Cote, MD Attending Physician, Orthopedic Surgery  This document was dictated using Dragon voice recognition software. A reasonable attempt at proof reading has been made to minimize errors.

## 2022-03-31 ENCOUNTER — Ambulatory Visit (HOSPITAL_BASED_OUTPATIENT_CLINIC_OR_DEPARTMENT_OTHER): Payer: Self-pay | Admitting: Orthopaedic Surgery

## 2022-04-04 ENCOUNTER — Ambulatory Visit (HOSPITAL_BASED_OUTPATIENT_CLINIC_OR_DEPARTMENT_OTHER): Payer: BC Managed Care – PPO | Admitting: Physical Therapy

## 2022-04-06 DIAGNOSIS — F411 Generalized anxiety disorder: Secondary | ICD-10-CM | POA: Diagnosis not present

## 2022-04-10 ENCOUNTER — Encounter (HOSPITAL_BASED_OUTPATIENT_CLINIC_OR_DEPARTMENT_OTHER): Payer: Self-pay | Admitting: Orthopaedic Surgery

## 2022-04-10 ENCOUNTER — Ambulatory Visit (INDEPENDENT_AMBULATORY_CARE_PROVIDER_SITE_OTHER): Payer: BC Managed Care – PPO | Admitting: Family Medicine

## 2022-04-10 ENCOUNTER — Encounter (INDEPENDENT_AMBULATORY_CARE_PROVIDER_SITE_OTHER): Payer: Self-pay | Admitting: Family Medicine

## 2022-04-10 VITALS — BP 131/78 | HR 100 | Ht 62.0 in | Wt 291.0 lb

## 2022-04-10 DIAGNOSIS — F39 Unspecified mood [affective] disorder: Secondary | ICD-10-CM | POA: Diagnosis not present

## 2022-04-10 DIAGNOSIS — E669 Obesity, unspecified: Secondary | ICD-10-CM | POA: Diagnosis not present

## 2022-04-10 DIAGNOSIS — E559 Vitamin D deficiency, unspecified: Secondary | ICD-10-CM

## 2022-04-10 DIAGNOSIS — Z6841 Body Mass Index (BMI) 40.0 and over, adult: Secondary | ICD-10-CM | POA: Diagnosis not present

## 2022-04-10 MED ORDER — VITAMIN D (ERGOCALCIFEROL) 1.25 MG (50000 UNIT) PO CAPS: 50000.0000 [IU] | ORAL_CAPSULE | ORAL | 0 refills | Status: DC

## 2022-04-10 MED ORDER — BUPROPION HCL ER (SR) 200 MG PO TB12: 200.0000 mg | ORAL_TABLET | Freq: Two times a day (BID) | ORAL | 0 refills | Status: DC

## 2022-04-10 NOTE — Progress Notes (Unsigned)
Chief Complaint:   OBESITY Tara Kelley is here to discuss her progress with her obesity treatment plan along with follow-up of her obesity related diagnoses. Tara Kelley is on the Category 3 Plan and keeping a food journal and adhering to recommended goals of 400-600 calories and 40+ grams of protein daily and states she is following her eating plan approximately 50% of the time. Tara Kelley states she is doing 0 minutes 0 times per week.  Today's visit was #: 15 Starting weight: 311 lbs Starting date: 06/15/2021 Today's weight: 290 lbs Today's date: 03/29/2022 Total lbs lost to date: 21 Total lbs lost since last in-office visit: 0  Interim History: Tara Kelley has done well with minimizing holiday weight gain over Thanksgiving. She is struggling with cooking due to knee injury and would like to discuss easy recipe ideas.   Subjective:   1. Vitamin D deficiency Tara Kelley is on Vitamin D, but her level is not yet at goal.   2. Hypertension, essential Tara Kelley's blood pressure is stable on her medications. She is working on her diet and weight loss.   Assessment/Plan:   1. Vitamin D deficiency We will refill prescription Vitamin D for 1 month. Tara Kelley will follow-up for routine testing of Vitamin D, at least 2-3 times per year to avoid over-replacement.  - Vitamin D, Ergocalciferol, (DRISDOL) 1.25 MG (50000 UNIT) CAPS capsule; Take 1 capsule (50,000 Units total) by mouth every 7 (seven) days.  Dispense: 4 capsule; Refill: 0  2. Hypertension, essential Tara Kelley will continue with her diet, exercise, and medications and will continue to follow.   3. Obesity, Current BMI 53.1 Tara Kelley is currently in the action stage of change. As such, her goal is to continue with weight loss efforts. She has agreed to the Category 4 Plan.   Behavioral modification strategies: decreasing simple carbohydrates and holiday eating strategies .  Tara Kelley has agreed to follow-up with our clinic in 3 weeks. She was informed of the  importance of frequent follow-up visits to maximize her success with intensive lifestyle modifications for her multiple health conditions.   Objective:   Blood pressure 138/76, pulse 97, height 5\' 2"  (1.575 m), weight 290 lb (131.5 kg), last menstrual period 02/08/2022, SpO2 98 %. Body mass index is 53.04 kg/m.  General: Cooperative, alert, well developed, in no acute distress. HEENT: Conjunctivae and lids unremarkable. Cardiovascular: Regular rhythm.  Lungs: Normal work of breathing. Neurologic: No focal deficits.   Lab Results  Component Value Date   CREATININE 0.85 02/20/2022   BUN 10 02/20/2022   NA 139 02/20/2022   K 3.6 02/20/2022   CL 100 02/20/2022   CO2 23 02/20/2022   Lab Results  Component Value Date   ALT 21 02/20/2022   AST 22 02/20/2022   ALKPHOS 92 02/20/2022   BILITOT 0.4 02/20/2022   Lab Results  Component Value Date   HGBA1C 5.4 02/20/2022   HGBA1C 5.4 12/21/2021   HGBA1C 5.4 06/15/2021   Lab Results  Component Value Date   INSULIN 12.2 02/20/2022   INSULIN 21.5 12/21/2021   INSULIN 20.6 06/15/2021   Lab Results  Component Value Date   TSH 1.610 02/20/2022   Lab Results  Component Value Date   CHOL 216 (H) 02/20/2022   HDL 44 02/20/2022   LDLCALC 146 (H) 02/20/2022   TRIG 145 02/20/2022   CHOLHDL 4.4 06/15/2021   Lab Results  Component Value Date   VD25OH 39.7 02/20/2022   VD25OH 42.3 12/21/2021   VD25OH 18.9 (L) 06/15/2021  Lab Results  Component Value Date   WBC 8.5 06/15/2021   HGB 14.3 06/15/2021   HCT 43.7 06/15/2021   MCV 92 06/15/2021   PLT 332 06/15/2021   No results found for: "IRON", "TIBC", "FERRITIN"  Attestation Statements:   Reviewed by clinician on day of visit: allergies, medications, problem list, medical history, surgical history, family history, social history, and previous encounter notes.   I, Burt Knack, am acting as transcriptionist for Quillian Quince, MD.  I have reviewed the above documentation  for accuracy and completeness, and I agree with the above. -  Quillian Quince, MD

## 2022-04-17 DIAGNOSIS — L900 Lichen sclerosus et atrophicus: Secondary | ICD-10-CM | POA: Diagnosis not present

## 2022-04-17 DIAGNOSIS — L308 Other specified dermatitis: Secondary | ICD-10-CM | POA: Diagnosis not present

## 2022-04-19 NOTE — Pre-Procedure Instructions (Signed)
Surgical Instructions    Your procedure is scheduled on April 27, 2022.  Report to [facility] Main Entrance "A" at 11:50 A.M., then check in with the Admitting office.  Call this number if you have problems the morning of surgery:  [phone]   If you have any questions prior to your surgery date call [phone]: Open Monday-Friday 8am-4pm    Remember:  Do not eat after midnight the night before your surgery  You may drink clear liquids until 10:50 AM the morning of your surgery.   Clear liquids allowed are: Water, Non-Citrus Juices (without pulp), Carbonated Beverages, Clear Tea, Black Coffee Only (NO MILK, CREAM OR POWDERED CREAMER of any kind), and Gatorade.  Patient Instructions  The night before surgery:  No food after midnight. ONLY clear liquids after midnight  The day of surgery (if you do NOT have diabetes):  Drink ONE (1) Pre-Surgery Clear Ensure by 10:50 AM the morning of surgery. Drink in one sitting. Do not sip.  This drink was given to you during your hospital  pre-op appointment visit.  Nothing else to drink after completing the  Pre-Surgery Clear Ensure.         If you have questions, please contact your surgeon's office.     Take these medicines the morning of surgery with A SIP OF WATER:  buPROPion [facility] SR    May take if needed:  EPINEPHrine   loratadine (CLARITIN)   ondansetron (ZOFRAN ODT)   oxyCODONE (OXY IR/ROXICODONE)     Follow your surgeon's instructions on when to stop Aspirin.  If no instructions were given by your surgeon then you will need to call the office to get those instructions.     As of today, STOP taking any Aleve, Naproxen, Ibuprofen, Motrin, Advil, Goody's, BC's, all herbal medications, fish oil, and all vitamins.                     Do NOT Smoke (Tobacco/Vaping) for 24 hours prior to your procedure.  If you use a CPAP at night, you may bring your mask/headgear for your overnight stay.   Contacts, glasses,  piercing's, hearing aid's, dentures or partials may not be worn into surgery, please bring cases for these belongings.    For patients admitted to the hospital, discharge time will be determined by your treatment team.   Patients discharged the day of surgery will not be allowed to drive home, and someone needs to stay with them for 24 hours.  SURGICAL WAITING ROOM VISITATION Patients having surgery or a procedure may have no more than 2 support people in the waiting area - these visitors may rotate.   Children under the age of 55 must have an adult with them who is not the patient. If the patient needs to stay at the hospital during part of their recovery, the visitor guidelines for inpatient rooms apply. Pre-op nurse will coordinate an appropriate time for 1 support person to accompany patient in pre-op.  This support person may not rotate.   Please refer to the [facility] website for the visitor guidelines for Inpatients (after your surgery is over and you are in a regular room).    Special instructions:   [facility]- Preparing For Surgery  Before surgery, you can play an important role. Because skin is not sterile, your skin needs to be as free of germs as possible. You can reduce the number of germs on your skin by washing with CHG (chlorahexidine gluconate) Soap  before surgery.  CHG is an antiseptic cleaner which kills germs and bonds with the skin to continue killing germs even after washing.    Oral Hygiene is also important to reduce your risk of infection.  Remember - BRUSH YOUR TEETH THE MORNING OF SURGERY WITH YOUR REGULAR TOOTHPASTE  Please do not use if you have an allergy to CHG or antibacterial soaps. If your skin becomes reddened/irritated stop using the CHG.  Do not shave (including legs and underarms) for at least 48 hours prior to first CHG shower. It is OK to shave your face.  Please follow these instructions carefully.   Shower the NIGHT BEFORE SURGERY and the  MORNING OF SURGERY  If you chose to wash your hair, wash your hair first as usual with your normal shampoo.  After you shampoo, rinse your hair and body thoroughly to remove the shampoo.  Use CHG Soap as you would any other liquid soap. You can apply CHG directly to the skin and wash gently with a scrungie or a clean washcloth.   Apply the CHG Soap to your body ONLY FROM THE NECK DOWN.  Do not use on open wounds or open sores. Avoid contact with your eyes, ears, mouth and genitals (private parts). Wash Face and genitals (private parts)  with your normal soap.   Wash thoroughly, paying special attention to the area where your surgery will be performed.  Thoroughly rinse your body with warm water from the neck down.  DO NOT shower/wash with your normal soap after using and rinsing off the CHG Soap.  Pat yourself dry with a CLEAN TOWEL.  Wear CLEAN PAJAMAS to bed the night before surgery  Place CLEAN SHEETS on your bed the night before your surgery  DO NOT SLEEP WITH PETS.   Day of Surgery: Take a shower with CHG soap. Do not wear jewelry or makeup Do not wear lotions, powders, perfumes/colognes, or deodorant. Do not shave 48 hours prior to surgery.  Men may shave face and neck. Do not bring valuables to the hospital.  [facility] is not responsible for any belongings or valuables. Do not wear nail polish, gel polish, artificial nails, or any other type of covering on natural nails (fingers and toes) If you have artificial nails or gel coating that need to be removed by a nail salon, please have this removed prior to surgery. Artificial nails or gel coating may interfere with anesthesia's ability to adequately monitor your vital signs.  Wear Clean/Comfortable clothing the morning of surgery Remember to brush your teeth WITH YOUR REGULAR TOOTHPASTE.   Please read over the following fact sheets that you were given.    If you received a COVID test during your pre-op visit  it is  requested that you wear a mask when out in public, stay away from anyone that may not be feeling well and notify your surgeon if you develop symptoms. If you have been in contact with anyone that has tested positive in the last 10 days please notify you surgeon.

## 2022-04-19 NOTE — Progress Notes (Signed)
Chief Complaint:   OBESITY Tara Kelley is here to discuss her progress with her obesity treatment plan along with follow-up of her obesity related diagnoses. Tara Kelley is on the Category 4 Plan and states she is following her eating plan approximately 30% of the time. Tara Kelley states she is doing 0 minutes 0 times per week.  Today's visit was #: 16 Starting weight: 311 lbs Starting date: 06/15/2021 Today's weight: 291 lbs Today's date: 04/10/2022 Total lbs lost to date: 20 Total lbs lost since last in-office visit: 0  Interim History: Tara Kelley has done well with minimizing holiday weight gain. She will be having foot and ankle surgery later this month.   Subjective:   1. Vitamin D deficiency Tara Kelley is stable on Vitamin D, with no side effects noted.   2. Mood disorder (HCC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 Tara Kelley notes increased stress recently, but she is doing well with minimizing emotional eating behaviors.   Assessment/Plan:   1. Vitamin D deficiency We will refill prescription Vitamin D for 1 month. [facility] will follow-up for routine testing of Vitamin D, at least 2-3 times per year to avoid over-replacement.  - Vitamin D, Ergocalciferol, (DRISDOL) 1.25 MG (50000 UNIT) CAPS capsule; Take 1 capsule (50,000 Units total) by mouth every 7 (seven) days.  Dispense: 4 capsule; Refill: 0  2. Mood disorder (HCC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 Tara Kelley will continue Wellbutrin SR 200 mg BID, and we will refill for 1 month.   - buPROPion (WELLBUTRIN SR) 200 MG 12 hr tablet; Take 1 tablet (200 mg total) by mouth 2 (two) times daily.  Dispense: 60 tablet; Refill: 0  3. Obesity, Current BMI 53.3 Tara Kelley is currently in the action stage of change. As such, her goal is to continue with weight loss efforts. She has agreed to the Category 4 Plan.   Pre and post-op nutritional advice was given to maximize surgery  outcome and decrease the risk of complications.   Behavioral modification strategies: increasing lean  protein intake.  Tara Kelley has agreed to follow-up with our clinic in 3 to 4 weeks. She was informed of the importance of frequent follow-up visits to maximize her success with intensive lifestyle modifications for her multiple health conditions.   Objective:   Blood pressure 131/78, pulse 100, height 5\' 2"  (1.575 m), weight 291 lb (132 kg), SpO2 98 %. Body mass index is 53.22 kg/m.  General: Cooperative, alert, well developed, in no acute distress. HEENT: Conjunctivae and lids unremarkable. Cardiovascular: Regular rhythm.  Lungs: Normal work of breathing. Neurologic: No focal deficits.   Lab Results  Component Value Date   CREATININE 0.85 02/20/2022   BUN 10 02/20/2022   NA 139 02/20/2022   K 3.6 02/20/2022   CL 100 02/20/2022   CO2 23 02/20/2022   Lab Results  Component Value Date   ALT 21 02/20/2022   AST 22 02/20/2022   ALKPHOS 92 02/20/2022   BILITOT 0.4 02/20/2022   Lab Results  Component Value Date   HGBA1C 5.4 02/20/2022   HGBA1C 5.4 12/21/2021   HGBA1C 5.4 06/15/2021   Lab Results  Component Value Date   INSULIN 12.2 02/20/2022   INSULIN 21.5 12/21/2021   INSULIN 20.6 06/15/2021   Lab Results  Component Value Date   TSH 1.610 02/20/2022   Lab Results  Component Value Date   CHOL 216 (H) 02/20/2022   HDL 44 02/20/2022   LDLCALC 146 (H) 02/20/2022   TRIG 145 02/20/2022   CHOLHDL 4.4 06/15/2021   Lab Results  Component Value Date   VD25OH 39.7 02/20/2022   VD25OH 42.3 12/21/2021   VD25OH 18.9 (L) 06/15/2021   Lab Results  Component Value Date   WBC 8.5 06/15/2021   HGB 14.3 06/15/2021   HCT 43.7 06/15/2021   MCV 92 06/15/2021   PLT 332 06/15/2021   No results found for: "IRON", "TIBC", "FERRITIN"  Attestation Statements:   Reviewed by clinician on day of visit: allergies, medications, problem list, medical history, surgical history, family history, social history, and previous encounter notes.   I, Trixie Dredge, am acting as  transcriptionist for Dennard Nip, MD.  I have reviewed the above documentation for accuracy and completeness, and I agree with the above. -  Dennard Nip, MD

## 2022-04-20 ENCOUNTER — Encounter (HOSPITAL_COMMUNITY)
Admission: RE | Admit: 2022-04-20 | Discharge: 2022-04-20 | Disposition: A | Discharge status: Home / Self Care | Payer: BC Managed Care – PPO | Source: Ambulatory Visit | Attending: Orthopaedic Surgery | Admitting: Orthopaedic Surgery

## 2022-04-20 ENCOUNTER — Encounter (HOSPITAL_COMMUNITY): Payer: Self-pay

## 2022-04-20 ENCOUNTER — Other Ambulatory Visit: Payer: Self-pay

## 2022-04-20 VITALS — BP 151/113 | HR 105 | Temp 97.5°F | Resp 18 | Ht 62.0 in | Wt 297.1 lb

## 2022-04-20 DIAGNOSIS — Z01812 Encounter for preprocedural laboratory examination: Secondary | ICD-10-CM | POA: Diagnosis not present

## 2022-04-20 DIAGNOSIS — I2510 Atherosclerotic heart disease of native coronary artery without angina pectoris: Secondary | ICD-10-CM | POA: Diagnosis not present

## 2022-04-20 HISTORY — DX: Other complications of anesthesia, initial encounter: T88.59XA

## 2022-04-20 HISTORY — DX: Other specified postprocedural states: R11.2

## 2022-04-20 HISTORY — DX: Family history of other specified conditions: Z84.89

## 2022-04-20 HISTORY — DX: Other specified postprocedural states: Z98.890

## 2022-04-20 LAB — CBC
HCT: 41.8 % (ref 36.0–46.0)
Hemoglobin: 14.5 g/dL (ref 12.0–15.0)
MCH: 31 pg (ref 26.0–34.0)
MCHC: 34.7 g/dL (ref 30.0–36.0)
MCV: 89.3 fL (ref 80.0–100.0)
Platelets: 326 10*3/uL (ref 150–400)
RBC: 4.68 MIL/uL (ref 3.87–5.11)
RDW: 12.3 % (ref 11.5–15.5)
WBC: 9.3 10*3/uL (ref 4.0–10.5)
nRBC: 0 % (ref 0.0–0.2)

## 2022-04-20 LAB — BASIC METABOLIC PANEL
Anion gap: 6 (ref 5–15)
BUN: 9 mg/dL (ref 6–20)
CO2: 26 mmol/L (ref 22–32)
Calcium: 8.9 mg/dL (ref 8.9–10.3)
Chloride: 104 mmol/L (ref 98–111)
Creatinine, Ser: 0.92 mg/dL (ref 0.44–1.00)
GFR, Estimated: >60 mL/min (ref >60)
Glucose, Bld: 81 mg/dL (ref 70–99)
Potassium: 3.9 mmol/L (ref 3.5–5.1)
Sodium: 136 mmol/L (ref 135–145)

## 2022-04-20 NOTE — Progress Notes (Signed)
PCP - Dr. Kasandra Knudsen. [place] Cardiologist - Denies  PPM/ICD - Denies Device Orders - n/a Rep Notified - n/a   Chest x-ray - n/a EKG - 06/15/2021 Stress Test - Denies ECHO - Denies Cardiac Cath - Denies  Sleep Study - Yes. Positive OSA 2021 CPAP - Pt used to wear CPAP, but couldn't tolerate it. 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  No DM  Last dose of GLP1 agonist- n/a GLP1 instructions: n/a  Blood Thinner Instructions: n/a Aspirin Instructions: ASA that is on current med list is prescribed for post-op. Reminded pt to not take any ASA one week prior to surgery.  ERAS Protcol - Clear liquids until 1050 morning of surgery PRE-SURGERY Ensure or G2- Ensure given to pt at PAT appointment  COVID TEST- n/a   Anesthesia review: No.    Patient denies shortness of breath, fever, cough and chest pain at PAT appointment   All instructions explained to the patient, with a verbal understanding of the material. Patient agrees to go over the instructions while at home for a better understanding. Patient also instructed to self quarantine after being tested for COVID-19. The opportunity to ask questions was provided.

## 2022-04-27 ENCOUNTER — Ambulatory Visit (HOSPITAL_COMMUNITY)
Admission: RE | Admit: 2022-04-27 | Discharge: 2022-04-27 | Disposition: A | Discharge status: Home / Self Care | Payer: BC Managed Care – PPO | Attending: Orthopaedic Surgery | Admitting: Orthopaedic Surgery

## 2022-04-27 ENCOUNTER — Ambulatory Visit (HOSPITAL_COMMUNITY): Payer: BC Managed Care – PPO | Admitting: Anesthesiology

## 2022-04-27 ENCOUNTER — Encounter (HOSPITAL_COMMUNITY)
Admission: RE | Disposition: A | Discharge status: Home / Self Care | Payer: Self-pay | Source: Home / Self Care | Attending: Orthopaedic Surgery

## 2022-04-27 ENCOUNTER — Encounter (HOSPITAL_COMMUNITY): Payer: Self-pay | Admitting: Orthopaedic Surgery

## 2022-04-27 ENCOUNTER — Other Ambulatory Visit: Payer: Self-pay

## 2022-04-27 DIAGNOSIS — G8918 Other acute postprocedural pain: Secondary | ICD-10-CM | POA: Diagnosis not present

## 2022-04-27 DIAGNOSIS — Z87891 Personal history of nicotine dependence: Secondary | ICD-10-CM | POA: Diagnosis not present

## 2022-04-27 DIAGNOSIS — G4730 Sleep apnea, unspecified: Secondary | ICD-10-CM | POA: Diagnosis not present

## 2022-04-27 DIAGNOSIS — Z8249 Family history of ischemic heart disease and other diseases of the circulatory system: Secondary | ICD-10-CM | POA: Diagnosis not present

## 2022-04-27 DIAGNOSIS — I10 Essential (primary) hypertension: Secondary | ICD-10-CM | POA: Diagnosis not present

## 2022-04-27 DIAGNOSIS — Z79899 Other long term (current) drug therapy: Secondary | ICD-10-CM | POA: Insufficient documentation

## 2022-04-27 DIAGNOSIS — S83242A Other tear of medial meniscus, current injury, left knee, initial encounter: Secondary | ICD-10-CM | POA: Insufficient documentation

## 2022-04-27 DIAGNOSIS — Z6841 Body Mass Index (BMI) 40.0 and over, adult: Secondary | ICD-10-CM | POA: Diagnosis not present

## 2022-04-27 DIAGNOSIS — X58XXXA Exposure to other specified factors, initial encounter: Secondary | ICD-10-CM | POA: Insufficient documentation

## 2022-04-27 DIAGNOSIS — M23322 Other meniscus derangements, posterior horn of medial meniscus, left knee: Secondary | ICD-10-CM

## 2022-04-27 HISTORY — PX: KNEE ARTHROSCOPY WITH MENISCAL REPAIR: SHX5653

## 2022-04-27 LAB — POCT PREGNANCY, URINE: Preg Test, Ur: NEGATIVE

## 2022-04-27 SURGERY — ARTHROSCOPY, KNEE, WITH MENISCUS REPAIR
Anesthesia: General | Site: Knee | Laterality: Left

## 2022-04-27 MED ORDER — FENTANYL CITRATE (PF) 250 MCG/5ML IJ SOLN
INTRAMUSCULAR | Status: DC | PRN
  Administered 2022-04-27 (×2): 50 ug via INTRAVENOUS

## 2022-04-27 MED ORDER — ONDANSETRON HCL 4 MG/2ML IJ SOLN
INTRAMUSCULAR | Status: DC | PRN
  Administered 2022-04-27: 4 mg via INTRAVENOUS

## 2022-04-27 MED ORDER — OXYCODONE HCL 5 MG PO TABS
ORAL_TABLET | ORAL | Status: AC
  Filled 2022-04-27: qty 1

## 2022-04-27 MED ORDER — PROPOFOL 10 MG/ML IV BOLUS
INTRAVENOUS | Status: AC
  Filled 2022-04-27: qty 20

## 2022-04-27 MED ORDER — MIDAZOLAM HCL 2 MG/2ML IJ SOLN
INTRAMUSCULAR | Status: AC
  Filled 2022-04-27: qty 2

## 2022-04-27 MED ORDER — CHLORHEXIDINE GLUCONATE 0.12 % MT SOLN
15.0000 mL | Freq: Once | OROMUCOSAL | Status: AC
  Administered 2022-04-27: 15 mL via OROMUCOSAL
  Filled 2022-04-27: qty 15

## 2022-04-27 MED ORDER — GABAPENTIN 300 MG PO CAPS
300.0000 mg | ORAL_CAPSULE | Freq: Once | ORAL | Status: AC
  Administered 2022-04-27: 300 mg via ORAL
  Filled 2022-04-27: qty 1

## 2022-04-27 MED ORDER — ACETAMINOPHEN 500 MG PO TABS
1000.0000 mg | ORAL_TABLET | Freq: Once | ORAL | Status: AC
  Administered 2022-04-27: 1000 mg via ORAL
  Filled 2022-04-27: qty 2

## 2022-04-27 MED ORDER — ROPIVACAINE HCL 5 MG/ML IJ SOLN
INTRAMUSCULAR | Status: DC | PRN
  Administered 2022-04-27: 25 mL via PERINEURAL

## 2022-04-27 MED ORDER — FENTANYL CITRATE (PF) 100 MCG/2ML IJ SOLN
INTRAMUSCULAR | Status: AC
  Filled 2022-04-27: qty 2

## 2022-04-27 MED ORDER — CLONIDINE HCL (ANALGESIA) 100 MCG/ML EP SOLN
EPIDURAL | Status: DC | PRN
  Administered 2022-04-27: 50 ug

## 2022-04-27 MED ORDER — FENTANYL CITRATE (PF) 250 MCG/5ML IJ SOLN
INTRAMUSCULAR | Status: AC
  Filled 2022-04-27: qty 5

## 2022-04-27 MED ORDER — EPINEPHRINE PF 1 MG/ML IJ SOLN
INTRAMUSCULAR | Status: AC
  Filled 2022-04-27: qty 2

## 2022-04-27 MED ORDER — OXYCODONE HCL 5 MG/5ML PO SOLN: 5.0000 mg | Freq: Once | ORAL | Status: AC | PRN

## 2022-04-27 MED ORDER — ORAL CARE MOUTH RINSE: 15.0000 mL | Freq: Once | OROMUCOSAL | Status: AC

## 2022-04-27 MED ORDER — LACTATED RINGERS IV SOLN: INTRAVENOUS | Status: DC

## 2022-04-27 MED ORDER — SODIUM CHLORIDE 0.9 % IR SOLN
Status: DC | PRN
  Administered 2022-04-27: 6000 mL

## 2022-04-27 MED ORDER — MIDAZOLAM HCL 2 MG/2ML IJ SOLN
1.0000 mg | Freq: Once | INTRAMUSCULAR | Status: AC
  Administered 2022-04-27: 1 mg via INTRAVENOUS

## 2022-04-27 MED ORDER — SCOPOLAMINE 1 MG/3DAYS TD PT72
1.0000 | MEDICATED_PATCH | TRANSDERMAL | Status: DC
  Administered 2022-04-27: 1.5 mg via TRANSDERMAL
  Filled 2022-04-27: qty 1

## 2022-04-27 MED ORDER — PROPOFOL 10 MG/ML IV BOLUS
INTRAVENOUS | Status: DC | PRN
  Administered 2022-04-27: 100 mg via INTRAVENOUS
  Administered 2022-04-27: 200 mg via INTRAVENOUS
  Administered 2022-04-27: 100 mg via INTRAVENOUS

## 2022-04-27 MED ORDER — SUCCINYLCHOLINE CHLORIDE 200 MG/10ML IV SOSY
PREFILLED_SYRINGE | INTRAVENOUS | Status: DC | PRN
  Administered 2022-04-27: 140 mg via INTRAVENOUS

## 2022-04-27 MED ORDER — ROCURONIUM BROMIDE 100 MG/10ML IV SOLN
INTRAVENOUS | Status: DC | PRN
  Administered 2022-04-27: 50 mg via INTRAVENOUS

## 2022-04-27 MED ORDER — TRANEXAMIC ACID-NACL 1000-0.7 MG/100ML-% IV SOLN
1000.0000 mg | INTRAVENOUS | Status: AC
  Administered 2022-04-27: 1000 mg via INTRAVENOUS
  Filled 2022-04-27: qty 100

## 2022-04-27 MED ORDER — SUGAMMADEX SODIUM 200 MG/2ML IV SOLN
INTRAVENOUS | Status: DC | PRN
  Administered 2022-04-27: 200 mg via INTRAVENOUS

## 2022-04-27 MED ORDER — ACETAMINOPHEN 10 MG/ML IV SOLN: 1000.0000 mg | Freq: Once | INTRAVENOUS | Status: DC | PRN

## 2022-04-27 MED ORDER — PROPOFOL 500 MG/50ML IV EMUL
INTRAVENOUS | Status: DC | PRN
  Administered 2022-04-27: 150 ug/kg/min via INTRAVENOUS

## 2022-04-27 MED ORDER — PROMETHAZINE HCL 25 MG/ML IJ SOLN: 6.2500 mg | INTRAMUSCULAR | Status: DC | PRN

## 2022-04-27 MED ORDER — LIDOCAINE HCL (CARDIAC) PF 100 MG/5ML IV SOSY
PREFILLED_SYRINGE | INTRAVENOUS | Status: DC | PRN
  Administered 2022-04-27: 100 mg via INTRATRACHEAL

## 2022-04-27 MED ORDER — FENTANYL CITRATE (PF) 100 MCG/2ML IJ SOLN
50.0000 ug | Freq: Once | INTRAMUSCULAR | Status: AC
  Administered 2022-04-27: 50 ug via INTRAVENOUS

## 2022-04-27 MED ORDER — ACETAMINOPHEN 160 MG/5ML PO SOLN: 325.0000 mg | ORAL | Status: DC | PRN

## 2022-04-27 MED ORDER — OXYCODONE HCL 5 MG PO TABS
5.0000 mg | ORAL_TABLET | Freq: Once | ORAL | Status: AC | PRN
  Administered 2022-04-27: 5 mg via ORAL

## 2022-04-27 MED ORDER — FENTANYL CITRATE (PF) 100 MCG/2ML IJ SOLN
25.0000 ug | INTRAMUSCULAR | Status: DC | PRN
  Administered 2022-04-27 (×2): 50 ug via INTRAVENOUS

## 2022-04-27 MED ORDER — DEXAMETHASONE SODIUM PHOSPHATE 10 MG/ML IJ SOLN
INTRAMUSCULAR | Status: DC | PRN
  Administered 2022-04-27: 5 mg via INTRAVENOUS

## 2022-04-27 MED ORDER — AMISULPRIDE (ANTIEMETIC) 5 MG/2ML IV SOLN: 10.0000 mg | Freq: Once | INTRAVENOUS | Status: DC | PRN

## 2022-04-27 MED ORDER — ACETAMINOPHEN 325 MG PO TABS: 325.0000 mg | ORAL_TABLET | ORAL | Status: DC | PRN

## 2022-04-27 MED ORDER — BUPIVACAINE-EPINEPHRINE (PF) 0.5% -1:200000 IJ SOLN
INTRAMUSCULAR | Status: AC
  Filled 2022-04-27: qty 30

## 2022-04-27 MED ORDER — CEFAZOLIN SODIUM-DEXTROSE 2-4 GM/100ML-% IV SOLN
2.0000 g | INTRAVENOUS | Status: AC
  Administered 2022-04-27: 3 g via INTRAVENOUS
  Filled 2022-04-27: qty 100

## 2022-04-27 SURGICAL SUPPLY — 75 items
ALCOHOL 70% 16 OZ (MISCELLANEOUS) ×1 IMPLANT
BAG COUNTER SPONGE SURGICOUNT (BAG) ×1 IMPLANT
BANDAGE ESMARK 6X9 LF (GAUZE/BANDAGES/DRESSINGS) IMPLANT
BLADE CLIPPER SURG (BLADE) IMPLANT
BLADE EXCALIBUR 4.0X13 (MISCELLANEOUS) ×1 IMPLANT
BLADE SHAVER TORPEDO 4X13 (MISCELLANEOUS) ×1 IMPLANT
BLADE SURG 10 STRL SS (BLADE) ×1 IMPLANT
BLADE SURG 15 STRL LF DISP TIS (BLADE) ×1 IMPLANT
BLADE SURG 15 STRL SS (BLADE) ×1
BNDG ELASTIC 6X10 VLCR STRL LF (GAUZE/BANDAGES/DRESSINGS) IMPLANT
BNDG ELASTIC 6X5.8 VLCR STR LF (GAUZE/BANDAGES/DRESSINGS) IMPLANT
BNDG ESMARK 6X9 LF (GAUZE/BANDAGES/DRESSINGS)
CHLORAPREP W/TINT 26 (MISCELLANEOUS) ×1 IMPLANT
COOLER ICEMAN CLASSIC (MISCELLANEOUS) ×1 IMPLANT
COVER SURGICAL LIGHT HANDLE (MISCELLANEOUS) ×1 IMPLANT
CUFF TOURN SGL QUICK 34 (TOURNIQUET CUFF)
CUFF TOURN SGL QUICK 42 (TOURNIQUET CUFF) IMPLANT
CUFF TRNQT CYL 34X4.125X (TOURNIQUET CUFF) IMPLANT
DRAPE ARTHROSCOPY W/POUCH 114 (DRAPES) ×1 IMPLANT
DRAPE HALF SHEET 40X57 (DRAPES) IMPLANT
DRAPE INCISE IOBAN 66X45 STRL (DRAPES) IMPLANT
DRAPE U-SHAPE 47X51 STRL (DRAPES) ×1 IMPLANT
DRSG TEGADERM 4X4.75 (GAUZE/BANDAGES/DRESSINGS) ×3 IMPLANT
DW OUTFLOW CASSETTE/TUBE SET (MISCELLANEOUS) ×1 IMPLANT
ELECT REM PT RETURN 9FT ADLT (ELECTROSURGICAL) ×1
ELECTRODE REM PT RTRN 9FT ADLT (ELECTROSURGICAL) ×1 IMPLANT
GAUZE SPONGE 4X4 12PLY STRL (GAUZE/BANDAGES/DRESSINGS) IMPLANT
GAUZE XEROFORM 1X8 LF (GAUZE/BANDAGES/DRESSINGS) IMPLANT
GLOVE BIOGEL PI IND STRL 6.5 (GLOVE) ×1 IMPLANT
GLOVE BIOGEL PI IND STRL 8 (GLOVE) ×1 IMPLANT
GLOVE ECLIPSE 6.0 STRL STRAW (GLOVE) ×1 IMPLANT
GLOVE INDICATOR 8.0 STRL GRN (GLOVE) ×1 IMPLANT
GOWN STRL REUS W/ TWL LRG LVL3 (GOWN DISPOSABLE) ×2 IMPLANT
GOWN STRL REUS W/ TWL XL LVL3 (GOWN DISPOSABLE) ×1 IMPLANT
GOWN STRL REUS W/TWL LRG LVL3 (GOWN DISPOSABLE) ×2
GOWN STRL REUS W/TWL XL LVL3 (GOWN DISPOSABLE) ×1
KIT BASIN OR (CUSTOM PROCEDURE TRAY) ×1 IMPLANT
KIT ROOT REPAIR MEINISCAL PEEK (Anchor) IMPLANT
KIT TURNOVER KIT B (KITS) ×1 IMPLANT
MANIFOLD NEPTUNE II (INSTRUMENTS) IMPLANT
MEINISCAL ROOT REPAIR KIT PEEK (Anchor) ×1 IMPLANT
NDL 18GX1X1/2 (RX/OR ONLY) (NEEDLE) IMPLANT
NDL HYPO 25GX1X1/2 BEV (NEEDLE) ×1 IMPLANT
NDL SUT 2-0 SCORPION KNEE (NEEDLE) IMPLANT
NEEDLE 18GX1X1/2 (RX/OR ONLY) (NEEDLE) IMPLANT
NEEDLE HYPO 25GX1X1/2 BEV (NEEDLE) ×1 IMPLANT
NEEDLE SUT 2-0 SCORPION KNEE (NEEDLE) IMPLANT
NS IRRIG 1000ML POUR BTL (IV SOLUTION) ×1 IMPLANT
PACK ARTHROSCOPY DSU (CUSTOM PROCEDURE TRAY) ×1 IMPLANT
PAD ABD 8X10 STRL (GAUZE/BANDAGES/DRESSINGS) IMPLANT
PAD ARMBOARD 7.5X6 YLW CONV (MISCELLANEOUS) ×2 IMPLANT
PAD COLD SHLDR WRAP-ON (PAD) IMPLANT
PADDING CAST COTTON 6X4 STRL (CAST SUPPLIES) ×1 IMPLANT
PENCIL BUTTON HOLSTER BLD 10FT (ELECTRODE) ×1 IMPLANT
PORT APPOLLO RF 90DEGREE MULTI (SURGICAL WAND) IMPLANT
SOL PREP POV-IOD 4OZ 10% (MISCELLANEOUS) ×1 IMPLANT
SPONGE T-LAP 4X18 ~~LOC~~+RFID (SPONGE) ×1 IMPLANT
SUCTION FRAZIER HANDLE 10FR (MISCELLANEOUS) ×1
SUCTION TUBE FRAZIER 10FR DISP (MISCELLANEOUS) IMPLANT
SUT 0 FIBERLOOP 38 BLUE TPR ND (SUTURE) ×1
SUT ETHILON 3 0 PS 1 (SUTURE) IMPLANT
SUT FIBERWIRE 2-0 18 17.9 3/8 (SUTURE)
SUT MENISCAL KIT (KITS) IMPLANT
SUT VIC AB 2-0 CT1 27 (SUTURE) ×1
SUT VIC AB 2-0 CT1 TAPERPNT 27 (SUTURE) IMPLANT
SUTURE 0 FIBERLP 38 BLU TPR ND (SUTURE) IMPLANT
SUTURE FIBERWR 2-0 18 17.9 3/8 (SUTURE) IMPLANT
SYR 20ML ECCENTRIC (SYRINGE) ×1 IMPLANT
SYR CONTROL 10ML LL (SYRINGE) IMPLANT
SYR TB 1ML LUER SLIP (SYRINGE) ×1 IMPLANT
TOWEL GREEN STERILE (TOWEL DISPOSABLE) ×1 IMPLANT
TOWEL GREEN STERILE FF (TOWEL DISPOSABLE) ×1 IMPLANT
TUBE CONNECTING 12X1/4 (SUCTIONS) ×1 IMPLANT
TUBING ARTHROSCOPY IRRIG 16FT (MISCELLANEOUS) ×1 IMPLANT
WATER STERILE IRR 1000ML POUR (IV SOLUTION) ×1 IMPLANT

## 2022-04-27 NOTE — Anesthesia Procedure Notes (Addendum)
Anesthesia Regional Block: Adductor canal block   Pre-Anesthetic Checklist: , timeout performed,  Correct Patient, Correct Site, Correct Laterality,  Correct Procedure, Correct Position, site marked,  Risks and benefits discussed,  Surgical consent,  Pre-op evaluation,  At surgeon's request and post-op pain management  Laterality: Left  Prep: chloraprep       Needles:  Injection technique: Single-shot  Needle Type: Echogenic Needle     Needle Length: 9cm  Needle Gauge: 21     Additional Needles:   Procedures:,,,, ultrasound used (permanent image in chart),,    Narrative:  Start time: 04/27/2022 1:07 PM End time: 04/27/2022 1:14 PM Injection made incrementally with aspirations every 5 mL.  Performed by: Personally  Anesthesiologist: Marcene Duos, MD

## 2022-04-27 NOTE — Interval H&P Note (Signed)
History and Physical Interval Note:  04/27/2022 12:55 PM  Tara Kelley  has presented today for surgery, with the diagnosis of LEFT MEDIAL MENISCAL TEAR.  The various methods of treatment have been discussed with the patient and family. After consideration of risks, benefits and other options for treatment, the patient has consented to  Procedure(s): LEFT KNEE ARTHROSCOPY WITH MEDIAL MENISCAL REPAIR (Left) as a surgical intervention.  The patient's history has been reviewed, patient examined, no change in status, stable for surgery.  I have reviewed the patient's chart and labs.  Questions were answered to the patient's satisfaction.     Huel Cote

## 2022-04-27 NOTE — Progress Notes (Addendum)
Orthopedic Tech Progress Note Patient Details:  Tara Kelley 08/22/1980 [id number]  Crutches adjusted to height and training completed. Pt is understanding of use for NWB and how to adjust as necessary.  Order for crutches was not populating on ortho tech board, but I was able to see the order from Dr. Steward Drone on [employer]in[employer] Jewel, RN, computer.  Ortho Devices Type of Ortho Device: Crutches Ortho Device/Splint Location: adjusted, with pt Ortho Device/Splint Interventions: Ordered, Adjustment, Application   Post Interventions Instructions Provided: Care of device, Adjustment of device, Poper ambulation with device  Docia Furl 04/27/2022, 6:09 PM

## 2022-04-27 NOTE — Anesthesia Postprocedure Evaluation (Signed)
Anesthesia Post Note  Patient: Tara Kelley  Procedure(s) Performed: LEFT KNEE ARTHROSCOPY WITH MEDIAL MENISCAL REPAIR (Left: Knee)     Patient location during evaluation: PACU Anesthesia Type: General Level of consciousness: awake and alert Pain management: pain level controlled Vital Signs Assessment: post-procedure vital signs reviewed and stable Respiratory status: spontaneous breathing, nonlabored ventilation, respiratory function stable and patient connected to nasal cannula oxygen Cardiovascular status: blood pressure returned to baseline and stable Postop Assessment: no apparent nausea or vomiting Anesthetic complications: no   No notable events documented.  Last Vitals:  Vitals:   04/27/22 1610 04/27/22 1625  BP: (!) 130/95 121/84  Pulse: 89 87  Resp: 19 16  Temp:  36.8 C  SpO2: 94% 95%    Last Pain:  Vitals:   04/27/22 1625  TempSrc:   PainSc: 2                  Shelton Silvas

## 2022-04-27 NOTE — Op Note (Signed)
Date of Surgery: 04/27/2022  INDICATIONS: Ms. Fulp is a 41 y.o.-year-old female with a left knee medial meniscal root tear.  The risk and benefits of the procedure were discussed in detail and documented in the pre-operative evaluation.   PREOPERATIVE DIAGNOSIS: 1. Left knee medial meniscal root tear, posterior  POSTOPERATIVE DIAGNOSIS: Same.  PROCEDURE: 1. Left knee posterior medial meniscal root repair  SURGEON: Yevonne Pax MD  ASSISTANT: Raynelle Fanning, ATC  ANESTHESIA:  general plus adductor canal block  IV FLUIDS AND URINE: See anesthesia record.  ANTIBIOTICS: Ancef  ESTIMATED BLOOD LOSS: 10 mL.  IMPLANTS:  Implant Name Type Inv. Item Serial No. Manufacturer Lot No. LRB No. Used Action  MEINISCAL ROOT REPAIR KIT PEEK - [id number] Anchor MEINISCAL ROOT REPAIR KIT PEEK  [employer] [id number] Left 1 Implanted    DRAINS: None  CULTURES: None  COMPLICATIONS: none  DESCRIPTION OF PROCEDURE:  Examination under anesthesia: A careful examination under anesthesia was performed.  Knee ROM motion was: -5-135 Lachman: Normal Pivot Shift: Normal Posterior drawer: normal.   Varus stability in full extension: normal.   Varus stability in 30 degrees of flexion: normal.  Valgus stability in full extension: normal.   Valgus stability in 30 degrees of flexion: normal.  Posterolateral drawer: normal   Intra-operative findings: A thorough arthroscopic examination of the knee was performed.  The findings are: 1. Suprapatellar pouch: Normal 2. Undersurface of median ridge: Normal 3. Medial patellar facet: Normal 4. Lateral patellar facet: Normal 5. Trochlea: Normal 6. Lateral gutter/popliteus tendon: Normal 7. Hoffa's fat pad: Normal 8. Medial gutter/plica: Normal 9. ACL: Normal 10. PCL: Normal 11. Medial meniscus:  Complete tear at posterior root 12. Medial compartment cartilage: Grade 2 changes involving the medial femoral condyle 13. Lateral meniscus: Normal 14.  Lateral compartment cartilage: Normal  I identified the patient in the pre-operative holding area.  I marked the operative knee with my initials. I reviewed the risks and benefits of the proposed surgical intervention and the patient wished to proceed.  Anesthesia performed a peripheral nerve block.  Patient was subsequently taken back to the operating room.  The patient was transferred to the operative suite and placed in the supine position with all bony prominences padded.     SCDs were placed on the non-operative lower extremity. Appropriate antibiotics was administered within 1 hour before incision. The operative lower extremity was then prepped and draped in standard fashion. A time out was performed confirming the correct extremity, correct patient and correct procedure.    A standard anterolateral portal was made with an 11 blade.  The ideal position for the anteromedial portal was established using a spinal needle.  This AM portal was then created under direct visualization with an 11 blade.  A full diagnostic arthroscopy was then performed, as described above, including probing of the chondral and meniscal surfaces.     The meniscus root repair was then performed.  A knee scorpion from [employer] was then used to place two 0 non-absorbable sutures through the torn posterior horn meniscus 1.5 cm medial to the posterior root. The 2 free ends of the suture were passed through the loop, creating a locking stitch/luggage tag.   A tibial tunnel was created at the media meniscal root attachment using an ACL guide. Once the guide was positioned intra-articularly at the center of the meniscal root, the [employer] retrocutter was introduced up through the footprint of the root.  The sleeve was malleted into place and a fiber stick was  then placed.  The sutures were retrieved through the medial portal.  These were used to pass the sutures in the root.  Care was taken to ensure that no suture bridge had formed.   The sutures of the meniscal root were then inserted into the tibia using a 4.75 mm swivel lock.  This was done by first drilling into the tibial bone and subsequently tapping the bone.  The sutures were tensioned and the anchor was placed.  Arthroscopy confirmed anatomic reduction of the root of the meniscus with good tension.  That concluded the case.  Skin was closed with 2-0 Vicryl and 3-0 nylon. Xeroform gauze, gauze, Tegaderm, Iceman and brace were applied.  Instrument, sponge, and needle counts were correct prior to wound closure and at the conclusion of the case.  The patient was taken to the PACU without complication   POSTOPERATIVE PLAN: She will be nonweight bearing with brace locked in extension for 1 week total. I will see her back in 2 weeks for suture removal. She will be placed on aspirin for DVT prophylaxis.  Yevonne Pax, MD 3:09 PM

## 2022-04-27 NOTE — Transfer of Care (Signed)
Immediate Anesthesia Transfer of Care Note  Patient: Tara Kelley  Procedure(s) Performed: LEFT KNEE ARTHROSCOPY WITH MEDIAL MENISCAL REPAIR (Left: Knee)  Patient Location: PACU  Anesthesia Type:General  Level of Consciousness: awake and patient cooperative  Airway & Oxygen Therapy: Patient Spontanous Breathing and Patient connected to face mask oxygen  Post-op Assessment: Report given to RN, Post -op Vital signs reviewed and stable, and Patient moving all extremities  Post vital signs: Reviewed and stable  Last Vitals:  Vitals Value Taken Time  BP 110/68 04/27/22 1537  Temp 36.8 C 04/27/22 1525  Pulse 92 04/27/22 1532  Resp 16 04/27/22 1532  SpO2 96 % 04/27/22 1532  Vitals shown include unvalidated device data.  Last Pain:  Vitals:   04/27/22 1525  TempSrc:   PainSc: 0-No pain         Complications: No notable events documented.

## 2022-04-27 NOTE — Discharge Instructions (Signed)
     Discharge Instructions    Attending Surgeon: Huel Cote, MD Office Phone Number: [phone]   Diagnosis and Procedures:    Surgeries Performed: Left knee medial meniscal root repair  Discharge Plan:    Diet: Resume usual diet. Begin with light or bland foods.  Drink plenty of fluids.  Activity:  Non weight bearing with brace locked in extension for 2 weeks. You are advised to go home directly from the hospital or surgical center. Restrict your activities.  GENERAL INSTRUCTIONS: 1.  Please apply ice to your wound to help with swelling and inflammation. This will improve your comfort and your overall recovery following surgery.     2. Please call Dr. Serena Croissant office at [phone] with questions Monday-Friday during business hours. If no one answers, please leave a message and someone should get back to the patient within 24 hours. For emergencies please call 911 or proceed to the emergency room.   3. Patient to notify surgical team if experiences any of the following: Bowel/Bladder dysfunction, uncontrolled pain, nerve/muscle weakness, incision with increased drainage or redness, nausea/vomiting and Fever greater than 101.0 F.  Be alert for signs of infection including redness, streaking, odor, fever or chills. Be alert for excessive pain or bleeding and notify your surgeon immediately.  WOUND INSTRUCTIONS:   Leave your dressing, cast, or splint in place until your post operative visit.  Keep it clean and dry.  Always keep the incision clean and dry until the staples/sutures are removed. If there is no drainage from the incision you should keep it open to air. If there is drainage from the incision you must keep it covered at all times until the drainage stops  Do not soak in a bath tub, hot tub, pool, lake or other body of water until 21 days after your surgery and your incision is completely dry and healed.  If you have removable sutures (or staples) they must be  removed 10-14 days (unless otherwise instructed) from the day of your surgery.     1)  Elevate the extremity as much as possible.  2)  Keep the dressing clean and dry.  3)  Please call us if the dressing becomes wet or dirty.  4)  If you are experiencing worsening pain or worsening swelling, please call.     MEDICATIONS: Resume all previous home medications at the previous prescribed dose and frequency unless otherwise noted Start taking the  pain medications on an as-needed basis as prescribed  Please taper down pain medication over the next week following surgery.  Ideally you should not require a refill of any narcotic pain medication.  Take pain medication with food to minimize nausea. In addition to the prescribed pain medication, you may take over-the-counter pain relievers such as Tylenol.  Do NOT take additional tylenol if your pain medication already has tylenol in it.  Aspirin 325mg  daily for four weeks.      FOLLOWUP INSTRUCTIONS: 1. Follow up at the Physical Therapy Clinic 3-4 days following surgery. This appointment should be scheduled unless other arrangements have been made.The Physical Therapy scheduling number is [phone] if an appointment has not already been arranged.  2. Contact Dr. [phone] office during office hours at [phone] or the practice after hours line at [phone] for non-emergencies. For medical emergencies call 911.   Discharge Location: Home

## 2022-04-27 NOTE — Anesthesia Preprocedure Evaluation (Addendum)
Anesthesia Evaluation  Patient identified by MRN, date of birth, ID band Patient awake    Reviewed: Allergy & Precautions, NPO status , Patient's Chart, lab work & pertinent test results  History of Anesthesia Complications (+) PONV and history of anesthetic complications  Airway Mallampati: IV  TM Distance: >3 FB     Dental  (+) Dental Advisory Given   Pulmonary sleep apnea , former smoker   breath sounds clear to auscultation       Cardiovascular hypertension, Pt. on medications  Rhythm:Regular Rate:Normal     Neuro/Psych    GI/Hepatic negative GI ROS, Neg liver ROS,,,  Endo/Other    Morbid obesity  Renal/GU negative Renal ROS     Musculoskeletal   Abdominal   Peds  Hematology negative hematology ROS (+)   Anesthesia Other Findings   Reproductive/Obstetrics                             Anesthesia Physical Anesthesia Plan  ASA: 3  Anesthesia Plan: General   Post-op Pain Management: Tylenol PO (pre-op)*, Gabapentin PO (pre-op)* and Regional block*   Induction: Intravenous  PONV Risk Score and Plan: 4 or greater and Midazolam, Dexamethasone, Ondansetron, Scopolamine patch - Pre-op, Treatment may vary due to age or medical condition, Propofol infusion and TIVA  Airway Management Planned: LMA  Additional Equipment:   Intra-op Plan:   Post-operative Plan: Extubation in OR  Informed Consent: I have reviewed the patients History and Physical, chart, labs and discussed the procedure including the risks, benefits and alternatives for the proposed anesthesia with the patient or authorized representative who has indicated his/her understanding and acceptance.     Dental advisory given  Plan Discussed with: CRNA  Anesthesia Plan Comments:        Anesthesia Quick Evaluation

## 2022-04-27 NOTE — Anesthesia Procedure Notes (Signed)
Procedure Name: Intubation Date/Time: 04/27/2022 1:44 PM  Performed by: Wendi Snipes, CRNAPre-anesthesia Checklist: Patient identified, Emergency Drugs available, Suction available and Patient being monitored Patient Re-evaluated:Patient Re-evaluated prior to induction Oxygen Delivery Method: Circle System Utilized Preoxygenation: Pre-oxygenation with 100% oxygen Induction Type: IV induction Ventilation: Mask ventilation without difficulty Laryngoscope Size: Glidescope and 3 Grade View: Grade I Tube type: Oral Tube size: 7.0 mm Number of attempts: 1 Airway Equipment and Method: Stylet and Oral airway Placement Confirmation: ETT inserted through vocal cords under direct vision, positive ETCO2 and breath sounds checked- equal and bilateral Secured at: 21 cm Tube secured with: Tape Dental Injury: Teeth and Oropharynx as per pre-operative assessment

## 2022-04-27 NOTE — Brief Op Note (Signed)
Brief Op Note  Date of Surgery: 04/27/2022  Preoperative Diagnosis: LEFT MEDIAL MENISCAL TEAR  Postoperative Diagnosis: same  Procedure: Procedure(s): LEFT KNEE ARTHROSCOPY WITH MEDIAL MENISCAL REPAIR  Implants: Implant Name Type Inv. Item Serial No. Manufacturer Lot No. LRB No. Used Action  MEINISCAL ROOT REPAIR KIT PEEK - [id number] Anchor MEINISCAL ROOT REPAIR KIT PEEK  [employer] [id number] Left 1 Implanted    Surgeons: Surgeon(s): Vanetta Mulders, MD  Anesthesia: General    Estimated Blood Loss: See anesthesia record  Complications: None  Condition to PACU: Stable  Yevonne Pax, MD 04/27/2022 3:09 PM

## 2022-04-28 ENCOUNTER — Encounter (HOSPITAL_COMMUNITY): Payer: Self-pay | Admitting: Orthopaedic Surgery

## 2022-05-02 ENCOUNTER — Encounter (INDEPENDENT_AMBULATORY_CARE_PROVIDER_SITE_OTHER): Payer: Self-pay | Admitting: Family Medicine

## 2022-05-02 ENCOUNTER — Telehealth (INDEPENDENT_AMBULATORY_CARE_PROVIDER_SITE_OTHER): Payer: BC Managed Care – PPO | Admitting: Family Medicine

## 2022-05-03 ENCOUNTER — Encounter (HOSPITAL_BASED_OUTPATIENT_CLINIC_OR_DEPARTMENT_OTHER): Payer: Self-pay | Admitting: Physical Therapy

## 2022-05-03 ENCOUNTER — Ambulatory Visit (HOSPITAL_BASED_OUTPATIENT_CLINIC_OR_DEPARTMENT_OTHER): Payer: BC Managed Care – PPO | Attending: Orthopaedic Surgery | Admitting: Physical Therapy

## 2022-05-03 ENCOUNTER — Other Ambulatory Visit: Payer: Self-pay

## 2022-05-03 DIAGNOSIS — R262 Difficulty in walking, not elsewhere classified: Secondary | ICD-10-CM | POA: Insufficient documentation

## 2022-05-03 DIAGNOSIS — M23322 Other meniscus derangements, posterior horn of medial meniscus, left knee: Secondary | ICD-10-CM | POA: Insufficient documentation

## 2022-05-03 DIAGNOSIS — M6281 Muscle weakness (generalized): Secondary | ICD-10-CM

## 2022-05-03 DIAGNOSIS — M25562 Pain in left knee: Secondary | ICD-10-CM | POA: Insufficient documentation
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
## 2022-05-08 ENCOUNTER — Ambulatory Visit (HOSPITAL_BASED_OUTPATIENT_CLINIC_OR_DEPARTMENT_OTHER): Payer: BC Managed Care – PPO | Admitting: Physical Therapy

## 2022-05-08 ENCOUNTER — Encounter (HOSPITAL_BASED_OUTPATIENT_CLINIC_OR_DEPARTMENT_OTHER): Payer: Self-pay | Admitting: Physical Therapy

## 2022-05-08 DIAGNOSIS — M25562 Pain in left knee: Secondary | ICD-10-CM | POA: Diagnosis not present

## 2022-05-08 DIAGNOSIS — M23322 Other meniscus derangements, posterior horn of medial meniscus, left knee: Secondary | ICD-10-CM | POA: Diagnosis not present

## 2022-05-08 DIAGNOSIS — M6281 Muscle weakness (generalized): Secondary | ICD-10-CM | POA: Diagnosis not present

## 2022-05-08 DIAGNOSIS — R262 Difficulty in walking, not elsewhere classified: Secondary | ICD-10-CM | POA: Diagnosis not present
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
## 2022-05-11 ENCOUNTER — Ambulatory Visit (INDEPENDENT_AMBULATORY_CARE_PROVIDER_SITE_OTHER): Payer: BC Managed Care – PPO | Admitting: Orthopaedic Surgery

## 2022-05-11 DIAGNOSIS — M23322 Other meniscus derangements, posterior horn of medial meniscus, left knee: Secondary | ICD-10-CM

## 2022-05-11 NOTE — Progress Notes (Signed)
Post Operative Evaluation    Procedure/Date of Surgery: Left knee meniscal medial root repair 12/28  Interval History:    Presents 2-week status post the above procedure overall doing extremely well.  She has been compliant with nonweightbearing.  She has been using a walker and crutches.  She has been able to achieve 90 degrees of flexion in physical therapy.   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   SOB (shortness of breath) on exertion    Past Surgical History:  Procedure Laterality Date   APPENDECTOMY  1998   cyst rupture  1998   Ovanrian cyst rupture   EYE SURGERY N/A 2015   lasik   KNEE ARTHROSCOPY WITH MENISCAL REPAIR Left 04/27/2022   Procedure: LEFT KNEE ARTHROSCOPY WITH MEDIAL MENISCAL REPAIR;  Surgeon: Vanetta Mulders, MD;  Location: [facility];  Service: Orthopedics;  Laterality: Left;   Social History   Socioeconomic History   Marital status: Significant Other    Spouse name: Not on file   Number of children: Not on file   Years of education: Not on file   Highest education level: Not on file  Occupational History   Not on file  Tobacco Use   Smoking status: Former    Types: Cigarettes    Quit date: 2001    Years since quitting: 23.0   Smokeless tobacco: Not on file  Vaping Use   Vaping Use: Never used  Substance and Sexual Activity   Alcohol use: Yes    Comment: rarely   Drug use: No   Sexual activity: Yes  Other Topics Concern   Not on file  Social History Narrative   Not on file   Social Determinants of Health    Financial Resource Strain: Not on file  Food Insecurity: Not on file  Transportation Needs: Not on file  Physical Activity: Not on file  Stress: Not on file  Social Connections: Not on file   Family History  Problem Relation Age of Onset   Hyperlipidemia Mother    Hypertension Mother    Diabetes Mother    Depression Mother    Anxiety disorder Mother    Obesity Mother    Depression Father    Hyperlipidemia Father    Hypertension Father    Sleep apnea Father    Allergies  Allergen Reactions   Wasp Venom Anaphylaxis   Codeine     GI issues   Sulfa Antibiotics Nausea And Vomiting   Sulfa Drugs Cross Reactors    Current Outpatient Medications  Medication Sig Dispense Refill   aspirin EC 325 MG tablet Take 1 tablet (325 mg total) by mouth daily. 30 tablet 0   buPROPion (WELLBUTRIN SR) 200 MG 12 hr tablet Take 1 tablet (200 mg total) by mouth 2 (two) times daily. 60 tablet 0  citalopram (CELEXA) 20 MG tablet Take 30 mg by mouth at bedtime.     clobetasol ointment (TEMOVATE) 0.35 % Apply 1 Application topically 2 (two) times daily.     EPINEPHrine 0.3 mg/0.3 mL IJ SOAJ injection Inject 0.3 mg into the muscle as needed for anaphylaxis.     hydrochlorothiazide (HYDRODIURIL) 25 MG tablet Take 25 mg by mouth daily.     ibuprofen (ADVIL) 800 MG tablet Take 800 mg by mouth every 8 (eight) hours as needed for moderate pain.     loratadine (CLARITIN) 10 MG tablet Take 10 mg by mouth daily as needed for allergies.     losartan (COZAAR) 100 MG tablet TAKE 1 TABLET BY MOUTH ONCE DAILY 15 tablet 0   Multiple Vitamin (MULTIVITAMIN) tablet Take 1 tablet by mouth daily.     mupirocin ointment (BACTROBAN) 2 % Apply 1 Application topically daily.     Naproxen Sod-diphenhydrAMINE (ALEVE PM PO) Take 2 tablets by mouth at bedtime as needed (pain).     ondansetron (ZOFRAN ODT) 8 MG disintegrating tablet Take 1 tablet (8 mg total) by mouth every 8 (eight) hours as needed for nausea or vomiting. 20  tablet 0   oxyCODONE (OXY IR/ROXICODONE) 5 MG immediate release tablet Take 1 tablet (5 mg total) by mouth every 4 (four) hours as needed (severe pain). 20 tablet 0   tacrolimus (PROTOPIC) 0.1 % ointment Apply 1 Application topically 2 (two) times daily.     Vitamin D, Ergocalciferol, (DRISDOL) 1.25 MG (50000 UNIT) CAPS capsule Take 1 capsule (50,000 Units total) by mouth every 7 (seven) days. 4 capsule 0   No current facility-administered medications for this visit.   No results found.  Review of Systems:   A ROS was performed including pertinent positives and negatives as documented in the HPI.   Musculoskeletal Exam:      Left knee incisions are well-appearing without erythema or drainage.  Range of motion is from 0 to 80 degrees.  No joint line tenderness.  Swelling improved.  Distal neurosensory exam intact  Imaging:      I personally reviewed and interpreted the radiographs.   Assessment:   2 weeks status post left medial meniscal root repair overall doing extremely well.  This time she will advance to 50% weightbearing then progressed this to 100% over the course the next 2 weeks.  I described that she will continue to use her brace for total up to 4 weeks.  I will plan to see her back in 4 weeks for reassessment  Plan :    -Return to clinic in 4 weeks for reassessment      I personally saw and evaluated the patient, and participated in the management and treatment plan.  Vanetta Mulders, MD Attending Physician, Orthopedic Surgery  This document was dictated using Dragon voice recognition software. A reasonable attempt at proof reading has been made to minimize errors.

## 2022-05-15 ENCOUNTER — Ambulatory Visit (HOSPITAL_BASED_OUTPATIENT_CLINIC_OR_DEPARTMENT_OTHER): Payer: BC Managed Care – PPO | Admitting: Physical Therapy

## 2022-05-15 ENCOUNTER — Encounter (HOSPITAL_BASED_OUTPATIENT_CLINIC_OR_DEPARTMENT_OTHER): Payer: Self-pay | Admitting: Physical Therapy

## 2022-05-15 DIAGNOSIS — M23322 Other meniscus derangements, posterior horn of medial meniscus, left knee: Secondary | ICD-10-CM | POA: Diagnosis not present

## 2022-05-15 DIAGNOSIS — M25562 Pain in left knee: Secondary | ICD-10-CM | POA: Diagnosis not present

## 2022-05-15 DIAGNOSIS — R262 Difficulty in walking, not elsewhere classified: Secondary | ICD-10-CM

## 2022-05-15 DIAGNOSIS — M6281 Muscle weakness (generalized): Secondary | ICD-10-CM | POA: Diagnosis not present
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
## 2022-05-22 ENCOUNTER — Encounter (HOSPITAL_BASED_OUTPATIENT_CLINIC_OR_DEPARTMENT_OTHER): Payer: Self-pay | Admitting: Physical Therapy

## 2022-05-22 ENCOUNTER — Ambulatory Visit (HOSPITAL_BASED_OUTPATIENT_CLINIC_OR_DEPARTMENT_OTHER): Payer: BC Managed Care – PPO | Admitting: Physical Therapy

## 2022-05-22 DIAGNOSIS — M25562 Pain in left knee: Secondary | ICD-10-CM | POA: Diagnosis not present

## 2022-05-22 DIAGNOSIS — M6281 Muscle weakness (generalized): Secondary | ICD-10-CM

## 2022-05-22 DIAGNOSIS — R262 Difficulty in walking, not elsewhere classified: Secondary | ICD-10-CM

## 2022-05-22 DIAGNOSIS — M23322 Other meniscus derangements, posterior horn of medial meniscus, left knee: Secondary | ICD-10-CM | POA: Diagnosis not present
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
## 2022-05-24 ENCOUNTER — Encounter (HOSPITAL_BASED_OUTPATIENT_CLINIC_OR_DEPARTMENT_OTHER): Payer: Self-pay | Admitting: Physical Therapy

## 2022-05-24 ENCOUNTER — Ambulatory Visit (HOSPITAL_BASED_OUTPATIENT_CLINIC_OR_DEPARTMENT_OTHER): Payer: BC Managed Care – PPO | Admitting: Physical Therapy

## 2022-05-24 DIAGNOSIS — M25562 Pain in left knee: Secondary | ICD-10-CM

## 2022-05-24 DIAGNOSIS — M6281 Muscle weakness (generalized): Secondary | ICD-10-CM

## 2022-05-24 DIAGNOSIS — M23322 Other meniscus derangements, posterior horn of medial meniscus, left knee: Secondary | ICD-10-CM | POA: Diagnosis not present

## 2022-05-24 DIAGNOSIS — R262 Difficulty in walking, not elsewhere classified: Secondary | ICD-10-CM | POA: Diagnosis not present
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
## 2022-05-29 ENCOUNTER — Ambulatory Visit (HOSPITAL_BASED_OUTPATIENT_CLINIC_OR_DEPARTMENT_OTHER): Payer: BC Managed Care – PPO | Admitting: Physical Therapy

## 2022-05-29 ENCOUNTER — Encounter (HOSPITAL_BASED_OUTPATIENT_CLINIC_OR_DEPARTMENT_OTHER): Payer: Self-pay | Admitting: Physical Therapy

## 2022-05-29 DIAGNOSIS — M25562 Pain in left knee: Secondary | ICD-10-CM | POA: Diagnosis not present

## 2022-05-29 DIAGNOSIS — R262 Difficulty in walking, not elsewhere classified: Secondary | ICD-10-CM | POA: Diagnosis not present

## 2022-05-29 DIAGNOSIS — M23322 Other meniscus derangements, posterior horn of medial meniscus, left knee: Secondary | ICD-10-CM | POA: Diagnosis not present

## 2022-05-29 DIAGNOSIS — M6281 Muscle weakness (generalized): Secondary | ICD-10-CM
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
## 2022-05-30 ENCOUNTER — Telehealth (INDEPENDENT_AMBULATORY_CARE_PROVIDER_SITE_OTHER): Payer: BC Managed Care – PPO | Admitting: Family Medicine

## 2022-05-30 ENCOUNTER — Encounter (INDEPENDENT_AMBULATORY_CARE_PROVIDER_SITE_OTHER): Payer: Self-pay | Admitting: Family Medicine

## 2022-05-30 DIAGNOSIS — G8929 Other chronic pain: Secondary | ICD-10-CM | POA: Diagnosis not present

## 2022-05-30 DIAGNOSIS — E559 Vitamin D deficiency, unspecified: Secondary | ICD-10-CM | POA: Diagnosis not present

## 2022-05-30 DIAGNOSIS — Z6841 Body Mass Index (BMI) 40.0 and over, adult: Secondary | ICD-10-CM | POA: Insufficient documentation

## 2022-05-30 DIAGNOSIS — M25562 Pain in left knee: Secondary | ICD-10-CM | POA: Diagnosis not present

## 2022-05-30 DIAGNOSIS — E669 Obesity, unspecified: Secondary | ICD-10-CM

## 2022-05-30 DIAGNOSIS — F39 Unspecified mood [affective] disorder: Secondary | ICD-10-CM | POA: Diagnosis not present

## 2022-05-30 MED ORDER — BUPROPION HCL ER (SR) 200 MG PO TB12: 200.0000 mg | ORAL_TABLET | Freq: Two times a day (BID) | ORAL | 0 refills | Status: DC

## 2022-05-30 MED ORDER — VITAMIN D (ERGOCALCIFEROL) 1.25 MG (50000 UNIT) PO CAPS: 50000.0000 [IU] | ORAL_CAPSULE | ORAL | 0 refills | Status: DC

## 2022-05-31 ENCOUNTER — Ambulatory Visit (HOSPITAL_BASED_OUTPATIENT_CLINIC_OR_DEPARTMENT_OTHER): Payer: BC Managed Care – PPO | Admitting: Physical Therapy

## 2022-05-31 ENCOUNTER — Encounter (HOSPITAL_BASED_OUTPATIENT_CLINIC_OR_DEPARTMENT_OTHER): Payer: Self-pay | Admitting: Physical Therapy

## 2022-05-31 DIAGNOSIS — M6281 Muscle weakness (generalized): Secondary | ICD-10-CM | POA: Diagnosis not present

## 2022-05-31 DIAGNOSIS — R262 Difficulty in walking, not elsewhere classified: Secondary | ICD-10-CM | POA: Diagnosis not present

## 2022-05-31 DIAGNOSIS — M25562 Pain in left knee: Secondary | ICD-10-CM

## 2022-05-31 DIAGNOSIS — M23322 Other meniscus derangements, posterior horn of medial meniscus, left knee: Secondary | ICD-10-CM | POA: Diagnosis not present
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
## 2022-06-01 ENCOUNTER — Encounter (INDEPENDENT_AMBULATORY_CARE_PROVIDER_SITE_OTHER): Payer: Self-pay | Admitting: Family Medicine

## 2022-06-05 ENCOUNTER — Encounter (HOSPITAL_BASED_OUTPATIENT_CLINIC_OR_DEPARTMENT_OTHER): Payer: Self-pay | Admitting: Physical Therapy

## 2022-06-05 ENCOUNTER — Ambulatory Visit (HOSPITAL_BASED_OUTPATIENT_CLINIC_OR_DEPARTMENT_OTHER): Payer: BC Managed Care – PPO | Attending: Orthopaedic Surgery | Admitting: Physical Therapy

## 2022-06-05 DIAGNOSIS — M25562 Pain in left knee: Secondary | ICD-10-CM | POA: Diagnosis not present

## 2022-06-05 DIAGNOSIS — R262 Difficulty in walking, not elsewhere classified: Secondary | ICD-10-CM | POA: Insufficient documentation

## 2022-06-05 DIAGNOSIS — M6281 Muscle weakness (generalized): Secondary | ICD-10-CM | POA: Insufficient documentation
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
## 2022-06-07 ENCOUNTER — Ambulatory Visit (HOSPITAL_BASED_OUTPATIENT_CLINIC_OR_DEPARTMENT_OTHER): Payer: BC Managed Care – PPO | Admitting: Physical Therapy

## 2022-06-07 ENCOUNTER — Ambulatory Visit (INDEPENDENT_AMBULATORY_CARE_PROVIDER_SITE_OTHER): Payer: BC Managed Care – PPO | Admitting: Orthopaedic Surgery

## 2022-06-07 ENCOUNTER — Encounter (HOSPITAL_BASED_OUTPATIENT_CLINIC_OR_DEPARTMENT_OTHER): Payer: Self-pay | Admitting: Physical Therapy

## 2022-06-07 DIAGNOSIS — M6281 Muscle weakness (generalized): Secondary | ICD-10-CM | POA: Diagnosis not present

## 2022-06-07 DIAGNOSIS — M25562 Pain in left knee: Secondary | ICD-10-CM

## 2022-06-07 DIAGNOSIS — R262 Difficulty in walking, not elsewhere classified: Secondary | ICD-10-CM

## 2022-06-07 DIAGNOSIS — M23322 Other meniscus derangements, posterior horn of medial meniscus, left knee: Secondary | ICD-10-CM

## 2022-06-07 NOTE — Progress Notes (Signed)
Post Operative Evaluation    Procedure/Date of Surgery: Left knee meniscal medial root repair 12/28  Interval History:   Presents today 6 weeks status post left knee medial meniscal root repair.  Overall she is doing well.  She is continuing to practice the range of motion and strengthening.  She has been in her brace.  She endorses improved pain.  There is some soreness about her tibial incision.  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   SOB (shortness of breath) on exertion    Past Surgical History:  Procedure Laterality Date   APPENDECTOMY  1998   cyst rupture  1998   Ovanrian cyst rupture   EYE SURGERY N/A 2015   lasik   KNEE ARTHROSCOPY WITH MENISCAL REPAIR Left 04/27/2022   Procedure: LEFT KNEE ARTHROSCOPY WITH MEDIAL MENISCAL REPAIR;  Surgeon: Vanetta Mulders, MD;  Location: [facility];  Service: Orthopedics;  Laterality: Left;   Social History   Socioeconomic History   Marital status: Significant Other    Spouse name: Not on file   Number of children: Not on file   Years of education: Not on file   Highest education level: Not on file  Occupational History   Not on file  Tobacco Use   Smoking status: Former    Types: Cigarettes    Quit date: 2001    Years since quitting: 23.0   Smokeless tobacco: Not on file  Vaping Use   Vaping Use: Never used  Substance and Sexual Activity   Alcohol use: Yes    Comment: rarely   Drug use: No   Sexual activity: Yes  Other Topics Concern   Not on file  Social History Narrative   Not on  file   Social Determinants of Health   Financial Resource Strain: Not on file  Food Insecurity: Not on file  Transportation Needs: Not on file  Physical Activity: Not on file  Stress: Not on file  Social Connections: Not on file   Family History  Problem Relation Age of Onset   Hyperlipidemia Mother    Hypertension Mother    Diabetes Mother    Depression Mother    Anxiety disorder Mother    Obesity Mother    Depression Father    Hyperlipidemia Father    Hypertension Father    Sleep apnea Father    Allergies  Allergen Reactions   Wasp Venom Anaphylaxis   Codeine     GI issues   Sulfa Antibiotics Nausea And Vomiting   Sulfa Drugs Cross Reactors    Current Outpatient Medications  Medication Sig Dispense Refill   aspirin EC 325 MG tablet Take 1 tablet (325 mg total) by mouth daily. 30 tablet 0   buPROPion (WELLBUTRIN SR) 200 MG 12 hr tablet Take 1 tablet (200 mg total) by mouth 2 (  two) times daily. 60 tablet 0   citalopram (CELEXA) 20 MG tablet Take 30 mg by mouth at bedtime.     clobetasol ointment (TEMOVATE) 1.93 % Apply 1 Application topically 2 (two) times daily.     EPINEPHrine 0.3 mg/0.3 mL IJ SOAJ injection Inject 0.3 mg into the muscle as needed for anaphylaxis.     hydrochlorothiazide (HYDRODIURIL) 25 MG tablet Take 25 mg by mouth daily.     ibuprofen (ADVIL) 800 MG tablet Take 800 mg by mouth every 8 (eight) hours as needed for moderate pain.     loratadine (CLARITIN) 10 MG tablet Take 10 mg by mouth daily as needed for allergies.     losartan (COZAAR) 100 MG tablet TAKE 1 TABLET BY MOUTH ONCE DAILY 15 tablet 0   Multiple Vitamin (MULTIVITAMIN) tablet Take 1 tablet by mouth daily.     mupirocin ointment (BACTROBAN) 2 % Apply 1 Application topically daily.     Naproxen Sod-diphenhydrAMINE (ALEVE PM PO) Take 2 tablets by mouth at bedtime as needed (pain).     ondansetron (ZOFRAN ODT) 8 MG disintegrating tablet Take 1 tablet (8 mg total) by mouth every 8 (eight) hours  as needed for nausea or vomiting. 20 tablet 0   oxyCODONE (OXY IR/ROXICODONE) 5 MG immediate release tablet Take 1 tablet (5 mg total) by mouth every 4 (four) hours as needed (severe pain). 20 tablet 0   tacrolimus (PROTOPIC) 0.1 % ointment Apply 1 Application topically 2 (two) times daily.     Vitamin D, Ergocalciferol, (DRISDOL) 1.25 MG (50000 UNIT) CAPS capsule Take 1 capsule (50,000 Units total) by mouth every 7 (seven) days. 4 capsule 0   No current facility-administered medications for this visit.   No results found.  Review of Systems:   A ROS was performed including pertinent positives and negatives as documented in the HPI.   Musculoskeletal Exam:      Left knee incisions are healed.  There is some scar around the tibial incision.  Range of motion is from 0 to 12 1 0 degrees.  No joint line tenderness.  Swelling improved.  Distal neurosensory exam intact  Imaging:      I personally reviewed and interpreted the radiographs.   Assessment:   6 weeks status post left medial meniscal root repair overall doing well.  I have advised her on some scar massage over the tibial incision.  Overall I would like her to wean out of her brace at this time.  She will progress to range of motion and weightbearing as tolerated.  She will continue to work on the strengthening portion of the protocol.  I will see her back in 6 weeks for reassessment  Plan :    -Return to clinic in 6 weeks for reassessment      I personally saw and evaluated the patient, and participated in the management and treatment plan.  Vanetta Mulders, MD Attending Physician, Orthopedic Surgery  This document was dictated using Dragon voice recognition software. A reasonable attempt at proof reading has been made to minimize errors.
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
## 2022-06-12 ENCOUNTER — Ambulatory Visit (HOSPITAL_BASED_OUTPATIENT_CLINIC_OR_DEPARTMENT_OTHER): Payer: BC Managed Care – PPO | Admitting: Physical Therapy

## 2022-06-12 ENCOUNTER — Encounter (HOSPITAL_BASED_OUTPATIENT_CLINIC_OR_DEPARTMENT_OTHER): Payer: Self-pay | Admitting: Physical Therapy

## 2022-06-12 DIAGNOSIS — R262 Difficulty in walking, not elsewhere classified: Secondary | ICD-10-CM

## 2022-06-12 DIAGNOSIS — M25562 Pain in left knee: Secondary | ICD-10-CM

## 2022-06-12 DIAGNOSIS — M6281 Muscle weakness (generalized): Secondary | ICD-10-CM
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
## 2022-06-13 NOTE — Progress Notes (Signed)
TeleHealth Visit:  Due to the COVID-19 pandemic, this visit was completed with telemedicine (audio/video) technology to reduce patient and provider exposure as well as to preserve personal protective equipment.   Tara Kelley has verbally consented to this TeleHealth visit. The patient is located at home, the provider is located at the [facility] office. The participants in this visit include the listed provider and patient. The visit was conducted today via MyChart Video.   Chief Complaint: OBESITY Tara Kelley is here to discuss her progress with her obesity treatment plan along with follow-up of her obesity related diagnoses. Tara Kelley is on the Category 4 Plan and states she is following her eating plan approximately 10% of the time. Tara Kelley states she is doing physical therapy exercises for 30 minutes 7 times per week.  Today's visit was #: 17 Starting weight: 311 lbs Starting date: 06/15/2021  Interim History: Tara Kelley had surgery for her left knee approximately 4 weeks ago. She is doing physical therapy for exercise, and appears to be recovering well. She hasn't been able to follow her eating plan as well.   Subjective:   1. Chronic pain of left knee Tara Kelley is doing well with recovering from miscues tear repair and she is doing well in PT. She is limited in her exercise.   2. Vitamin D deficiency Tara Kelley's Vitamin D level is slowly improving, but not yet at goal.   3. Emotional Eating Behavior Tara Kelley is doing well with minimizing emotional eating behaviors, although she has had extra challenges in the last month. No side effects were noted.   Assessment/Plan:   1. Chronic pain of left knee Tara Kelley is to follow-up with PT for exercise and once she is cleared we will advance her exercise.   2. Vitamin D deficiency Tara Kelley will continue prescription Vitamin D, and we will refill for 1 month.   - Vitamin D, Ergocalciferol, (DRISDOL) 1.25 MG (50000 UNIT) CAPS capsule; Take 1  capsule (50,000 Units total) by mouth every 7 (seven) days.  Dispense: 4 capsule; Refill: 0  3. Emotional Eating Behavior Tara Kelley will continue Wellbutrin SR, and we will refill for 1 month.   - buPROPion (WELLBUTRIN SR) 200 MG 12 hr tablet; Take 1 tablet (200 mg total) by mouth 2 (two) times daily.  Dispense: 60 tablet; Refill: 0  4. BMI 50.0-59.9, adult (Tara Kelley)  5. Obesity, Beginning BMI 56.88 Tara Kelley is currently in the action stage of change. As such, her goal is to continue with weight loss efforts. She has agreed to the Category 4 Plan.   Exercise goals: As is per PT.   Behavioral modification strategies: increasing lean protein intake.  Tara Kelley has agreed to follow-up with our clinic in 3 weeks. She was informed of the importance of frequent follow-up visits to maximize her success with intensive lifestyle modifications for her multiple health conditions.  Objective:   VITALS: Per patient if applicable, see vitals. GENERAL: Alert and in no acute distress. CARDIOPULMONARY: No increased WOB. Speaking in clear sentences.  PSYCH: Pleasant and cooperative. Speech normal rate and rhythm. Affect is appropriate. Insight and judgement are appropriate. Attention is focused, linear, and appropriate.  NEURO: Oriented as arrived to appointment on time with no prompting.   Lab Results  Component Value Date   CREATININE 0.92 04/20/2022   BUN 9 04/20/2022   NA 136 04/20/2022   K 3.9 04/20/2022   CL 104 04/20/2022   CO2 26 04/20/2022   Lab Results  Component Value Date   ALT  21 02/20/2022   AST 22 02/20/2022   ALKPHOS 92 02/20/2022   BILITOT 0.4 02/20/2022   Lab Results  Component Value Date   HGBA1C 5.4 02/20/2022   HGBA1C 5.4 12/21/2021   HGBA1C 5.4 06/15/2021   Lab Results  Component Value Date   INSULIN 12.2 02/20/2022   INSULIN 21.5 12/21/2021   INSULIN 20.6 06/15/2021   Lab Results  Component Value Date   TSH 1.610 02/20/2022   Lab Results  Component Value Date    CHOL 216 (H) 02/20/2022   HDL 44 02/20/2022   LDLCALC 146 (H) 02/20/2022   TRIG 145 02/20/2022   CHOLHDL 4.4 06/15/2021   Lab Results  Component Value Date   VD25OH 39.7 02/20/2022   VD25OH 42.3 12/21/2021   VD25OH 18.9 (L) 06/15/2021   Lab Results  Component Value Date   WBC 9.3 04/20/2022   HGB 14.5 04/20/2022   HCT 41.8 04/20/2022   MCV 89.3 04/20/2022   PLT 326 04/20/2022   No results found for: "IRON", "TIBC", "FERRITIN"  Attestation Statements:   Reviewed by clinician on day of visit: allergies, medications, problem list, medical history, surgical history, family history, social history, and previous encounter notes.   I, Trixie Dredge, am acting as transcriptionist for Dennard Nip, MD.  I have reviewed the above documentation for accuracy and completeness, and I agree with the above. - Dennard Nip, MD

## 2022-06-15 ENCOUNTER — Ambulatory Visit (HOSPITAL_BASED_OUTPATIENT_CLINIC_OR_DEPARTMENT_OTHER): Payer: BC Managed Care – PPO | Admitting: Physical Therapy

## 2022-06-15 ENCOUNTER — Encounter (HOSPITAL_BASED_OUTPATIENT_CLINIC_OR_DEPARTMENT_OTHER): Payer: Self-pay | Admitting: Physical Therapy

## 2022-06-15 DIAGNOSIS — R262 Difficulty in walking, not elsewhere classified: Secondary | ICD-10-CM

## 2022-06-15 DIAGNOSIS — M25562 Pain in left knee: Secondary | ICD-10-CM

## 2022-06-15 DIAGNOSIS — M6281 Muscle weakness (generalized): Secondary | ICD-10-CM
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
## 2022-06-16 ENCOUNTER — Encounter (HOSPITAL_BASED_OUTPATIENT_CLINIC_OR_DEPARTMENT_OTHER): Payer: Self-pay | Admitting: Physical Therapy

## 2022-06-19 ENCOUNTER — Ambulatory Visit (HOSPITAL_BASED_OUTPATIENT_CLINIC_OR_DEPARTMENT_OTHER): Payer: BC Managed Care – PPO | Admitting: Physical Therapy

## 2022-06-19 ENCOUNTER — Encounter (HOSPITAL_BASED_OUTPATIENT_CLINIC_OR_DEPARTMENT_OTHER): Payer: Self-pay | Admitting: Physical Therapy

## 2022-06-19 DIAGNOSIS — M6281 Muscle weakness (generalized): Secondary | ICD-10-CM

## 2022-06-19 DIAGNOSIS — R262 Difficulty in walking, not elsewhere classified: Secondary | ICD-10-CM | POA: Diagnosis not present

## 2022-06-19 DIAGNOSIS — M25562 Pain in left knee: Secondary | ICD-10-CM
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
## 2022-06-21 ENCOUNTER — Encounter (INDEPENDENT_AMBULATORY_CARE_PROVIDER_SITE_OTHER): Payer: Self-pay | Admitting: Family Medicine

## 2022-06-21 ENCOUNTER — Ambulatory Visit (INDEPENDENT_AMBULATORY_CARE_PROVIDER_SITE_OTHER): Payer: BC Managed Care – PPO | Admitting: Family Medicine

## 2022-06-21 VITALS — BP 134/90 | HR 94 | Temp 97.4°F | Ht 62.0 in | Wt 290.0 lb

## 2022-06-21 DIAGNOSIS — E559 Vitamin D deficiency, unspecified: Secondary | ICD-10-CM

## 2022-06-21 DIAGNOSIS — F39 Unspecified mood [affective] disorder: Secondary | ICD-10-CM | POA: Diagnosis not present

## 2022-06-21 DIAGNOSIS — Z6841 Body Mass Index (BMI) 40.0 and over, adult: Secondary | ICD-10-CM | POA: Diagnosis not present

## 2022-06-21 DIAGNOSIS — E669 Obesity, unspecified: Secondary | ICD-10-CM

## 2022-06-21 MED ORDER — VITAMIN D (ERGOCALCIFEROL) 1.25 MG (50000 UNIT) PO CAPS: 50000.0000 [IU] | ORAL_CAPSULE | ORAL | 0 refills | Status: DC

## 2022-06-21 MED ORDER — BUPROPION HCL ER (SR) 200 MG PO TB12: 200.0000 mg | ORAL_TABLET | Freq: Two times a day (BID) | ORAL | 0 refills | Status: DC

## 2022-06-22 ENCOUNTER — Encounter (HOSPITAL_BASED_OUTPATIENT_CLINIC_OR_DEPARTMENT_OTHER): Payer: BC Managed Care – PPO | Admitting: Physical Therapy

## 2022-06-26 ENCOUNTER — Encounter (HOSPITAL_BASED_OUTPATIENT_CLINIC_OR_DEPARTMENT_OTHER): Payer: Self-pay | Admitting: Physical Therapy

## 2022-06-26 ENCOUNTER — Encounter (HOSPITAL_BASED_OUTPATIENT_CLINIC_OR_DEPARTMENT_OTHER): Payer: Self-pay

## 2022-06-26 ENCOUNTER — Ambulatory Visit (HOSPITAL_BASED_OUTPATIENT_CLINIC_OR_DEPARTMENT_OTHER): Payer: BC Managed Care – PPO | Admitting: Physical Therapy

## 2022-06-28 ENCOUNTER — Encounter (HOSPITAL_BASED_OUTPATIENT_CLINIC_OR_DEPARTMENT_OTHER): Payer: Self-pay | Admitting: Physical Therapy

## 2022-06-28 ENCOUNTER — Ambulatory Visit (HOSPITAL_BASED_OUTPATIENT_CLINIC_OR_DEPARTMENT_OTHER): Payer: BC Managed Care – PPO | Admitting: Physical Therapy

## 2022-06-28 DIAGNOSIS — M25562 Pain in left knee: Secondary | ICD-10-CM | POA: Diagnosis not present

## 2022-06-28 DIAGNOSIS — M6281 Muscle weakness (generalized): Secondary | ICD-10-CM | POA: Diagnosis not present

## 2022-06-28 DIAGNOSIS — R262 Difficulty in walking, not elsewhere classified: Secondary | ICD-10-CM | POA: Diagnosis not present
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
## 2022-06-29 ENCOUNTER — Encounter (HOSPITAL_BASED_OUTPATIENT_CLINIC_OR_DEPARTMENT_OTHER): Payer: BC Managed Care – PPO | Admitting: Physical Therapy

## 2022-07-04 NOTE — Progress Notes (Signed)
Chief Complaint:   OBESITY Tara Kelley is here to discuss her progress with her obesity treatment plan along with follow-up of her obesity related diagnoses. Tara Kelley is on the Category 4 Plan and states she is following her eating plan approximately 50% of the time. Tara Kelley states she is walking for exercise.   Today's visit was #: 18 Starting weight: 311 lbs Starting date: 06/15/2021 Today's weight: 290 lbs Today's date: 06/21/2022 Total lbs lost to date: 21 Total lbs lost since last in-office visit: 1  Interim History: Tara Kelley did the low carbohydrate plan for weight the first week, but she struggled with feeling deprived.  She especially missed fruit but she is willing to try it again for 1 week at a time.  Subjective:   1. Vitamin D deficiency Tara Kelley is on vitamin D with no side effects noted.  She requests a refill today.  2. Emotional Eating Behavior Tara Kelley is doing better with working on strategies to help her not feel the need to snack later in the day.  No side effects were noted with her Wellbutrin.  Assessment/Plan:   1. Vitamin D deficiency Tara Kelley will continue prescription vitamin D, and we will refill for 1 month.  We will plan to recheck labs in the next 1 to 2 months.  - Vitamin D, Ergocalciferol, (DRISDOL) 1.25 MG (50000 UNIT) CAPS capsule; Take 1 capsule (50,000 Units total) by mouth every 7 (seven) days.  Dispense: 4 capsule; Refill: 0  2. Emotional Eating Behavior Tara Kelley will continue Wellbutrin SR, and we will refill for 1 month.  We will continue to monitor closely.  - buPROPion (WELLBUTRIN SR) 200 MG 12 hr tablet; Take 1 tablet (200 mg total) by mouth 2 (two) times daily.  Dispense: 60 tablet; Refill: 0  3. BMI 50.0-59.9, adult ([place])  4. Obesity, Beginning BMI 56.88 Tara Kelley is currently in the action stage of change. As such, her goal is to continue with weight loss efforts. She has agreed to keeping a food journal and adhering to recommended goals of  1500-1700 calories and 100+ grams of protein daily or following a lower carbohydrate, vegetable and lean protein rich diet plan.   Tara Kelley is to go between the low carb and journaling.  Exercise goals: As is.   Behavioral modification strategies: increasing lean protein intake, meal planning and cooking strategies, and emotional eating strategies.  Tara Kelley has agreed to follow-up with our clinic in 2 weeks. She was informed of the importance of frequent follow-up visits to maximize her success with intensive lifestyle modifications for her multiple health conditions.   Objective:   Blood pressure (!) 134/90, pulse 94, temperature (!) 97.4 F (36.3 C), height '5\' 2"'$  (1.575 m), weight 290 lb (131.5 kg), SpO2 95 %. Body mass index is 53.04 kg/m.  Lab Results  Component Value Date   CREATININE 0.92 04/20/2022   BUN 9 04/20/2022   NA 136 04/20/2022   K 3.9 04/20/2022   CL 104 04/20/2022   CO2 26 04/20/2022   Lab Results  Component Value Date   ALT 21 02/20/2022   AST 22 02/20/2022   ALKPHOS 92 02/20/2022   BILITOT 0.4 02/20/2022   Lab Results  Component Value Date   HGBA1C 5.4 02/20/2022   HGBA1C 5.4 12/21/2021   HGBA1C 5.4 06/15/2021   Lab Results  Component Value Date   INSULIN 12.2 02/20/2022   INSULIN 21.5 12/21/2021   INSULIN 20.6 06/15/2021   Lab Results  Component Value Date   TSH  1.610 02/20/2022   Lab Results  Component Value Date   CHOL 216 (H) 02/20/2022   HDL 44 02/20/2022   LDLCALC 146 (H) 02/20/2022   TRIG 145 02/20/2022   CHOLHDL 4.4 06/15/2021   Lab Results  Component Value Date   VD25OH 39.7 02/20/2022   VD25OH 42.3 12/21/2021   VD25OH 18.9 (L) 06/15/2021   Lab Results  Component Value Date   WBC 9.3 04/20/2022   HGB 14.5 04/20/2022   HCT 41.8 04/20/2022   MCV 89.3 04/20/2022   PLT 326 04/20/2022   No results found for: "IRON", "TIBC", "FERRITIN"  Attestation Statements:   Reviewed by clinician on day of visit: allergies,  medications, problem list, medical history, surgical history, family history, social history, and previous encounter notes.   I, Trixie Dredge, am acting as transcriptionist for Dennard Nip, MD.  I have reviewed the above documentation for accuracy and completeness, and I agree with the above. -  Dennard Nip, MD

## 2022-07-05 ENCOUNTER — Encounter (INDEPENDENT_AMBULATORY_CARE_PROVIDER_SITE_OTHER): Payer: Self-pay | Admitting: Family Medicine

## 2022-07-05 ENCOUNTER — Ambulatory Visit (INDEPENDENT_AMBULATORY_CARE_PROVIDER_SITE_OTHER): Payer: BC Managed Care – PPO | Admitting: Family Medicine

## 2022-07-05 ENCOUNTER — Encounter (HOSPITAL_BASED_OUTPATIENT_CLINIC_OR_DEPARTMENT_OTHER): Payer: Self-pay | Admitting: Physical Therapy

## 2022-07-05 ENCOUNTER — Ambulatory Visit (HOSPITAL_BASED_OUTPATIENT_CLINIC_OR_DEPARTMENT_OTHER): Payer: BC Managed Care – PPO | Attending: Orthopaedic Surgery | Admitting: Physical Therapy

## 2022-07-05 VITALS — BP 118/88 | HR 95 | Temp 97.6°F | Ht 62.0 in | Wt 289.0 lb

## 2022-07-05 DIAGNOSIS — M6281 Muscle weakness (generalized): Secondary | ICD-10-CM | POA: Diagnosis not present

## 2022-07-05 DIAGNOSIS — I10 Essential (primary) hypertension: Secondary | ICD-10-CM | POA: Diagnosis not present

## 2022-07-05 DIAGNOSIS — M25562 Pain in left knee: Secondary | ICD-10-CM | POA: Diagnosis not present

## 2022-07-05 DIAGNOSIS — E559 Vitamin D deficiency, unspecified: Secondary | ICD-10-CM

## 2022-07-05 DIAGNOSIS — Z6841 Body Mass Index (BMI) 40.0 and over, adult: Secondary | ICD-10-CM

## 2022-07-05 DIAGNOSIS — F39 Unspecified mood [affective] disorder: Secondary | ICD-10-CM

## 2022-07-05 DIAGNOSIS — R262 Difficulty in walking, not elsewhere classified: Secondary | ICD-10-CM | POA: Diagnosis not present

## 2022-07-05 MED ORDER — BUPROPION HCL ER (SR) 200 MG PO TB12: 200.0000 mg | ORAL_TABLET | Freq: Two times a day (BID) | ORAL | 0 refills | Status: DC

## 2022-07-05 MED ORDER — VITAMIN D (ERGOCALCIFEROL) 1.25 MG (50000 UNIT) PO CAPS: 50000.0000 [IU] | ORAL_CAPSULE | ORAL | 0 refills | Status: DC
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
## 2022-07-10 ENCOUNTER — Ambulatory Visit (HOSPITAL_BASED_OUTPATIENT_CLINIC_OR_DEPARTMENT_OTHER): Payer: BC Managed Care – PPO | Admitting: Physical Therapy

## 2022-07-10 ENCOUNTER — Encounter (HOSPITAL_BASED_OUTPATIENT_CLINIC_OR_DEPARTMENT_OTHER): Payer: Self-pay | Admitting: Physical Therapy

## 2022-07-10 DIAGNOSIS — M6281 Muscle weakness (generalized): Secondary | ICD-10-CM

## 2022-07-10 DIAGNOSIS — M25562 Pain in left knee: Secondary | ICD-10-CM

## 2022-07-10 DIAGNOSIS — R262 Difficulty in walking, not elsewhere classified: Secondary | ICD-10-CM
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
## 2022-07-17 ENCOUNTER — Encounter (HOSPITAL_BASED_OUTPATIENT_CLINIC_OR_DEPARTMENT_OTHER): Payer: Self-pay | Admitting: Physical Therapy

## 2022-07-17 ENCOUNTER — Ambulatory Visit (HOSPITAL_BASED_OUTPATIENT_CLINIC_OR_DEPARTMENT_OTHER): Payer: BC Managed Care – PPO | Admitting: Physical Therapy

## 2022-07-17 DIAGNOSIS — M25562 Pain in left knee: Secondary | ICD-10-CM

## 2022-07-17 DIAGNOSIS — R262 Difficulty in walking, not elsewhere classified: Secondary | ICD-10-CM

## 2022-07-17 DIAGNOSIS — M6281 Muscle weakness (generalized): Secondary | ICD-10-CM
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
## 2022-07-18 NOTE — Progress Notes (Unsigned)
Chief Complaint:   OBESITY Greenleigh is here to discuss her progress with her obesity treatment plan along with follow-up of her obesity related diagnoses. Jakeria is on keeping a food journal and adhering to recommended goals of 1500-1700 calories and 100+ grams of protein or following a lower carbohydrate, vegetable and lean protein rich diet plan and states she is following her eating plan approximately 75% of the time. Zaila states she is doing physical therapy.   Today's visit was #: 71 Starting weight: 311 lbs Starting date: 06/15/2021 Today's weight: 289 lbs Today's date: 07/05/2022 Total lbs lost to date: 22 Total lbs lost since last in-office visit: 1  Interim History: Dalasia tried the low carbohydrate plan last visit.  She is ready to change to journaling and she has questions about protein supplementations, meals, and protein timing.  Subjective:   1. Essential hypertension Miski's last blood pressure was elevated, but it is within normal limits today.  She denies chest pain or headache.  2. Vitamin D deficiency Julianys is stable on vitamin D with no side effects noted.  3. Emotional Eating Behavior Willella is doing well with identifying emotional eating behavior.  She is tolerating Wellbutrin with no side effects.  Assessment/Plan:   1. Essential hypertension Selenne will continue losartan and hydrochlorothiazide, and she will continue with her diet, exercise, and weight loss.  We will continue to monitor.  2. Vitamin D deficiency Eduardo will continue prescription vitamin D, and we will refill for 1 month.  - Vitamin D, Ergocalciferol, (DRISDOL) 1.25 MG (50000 UNIT) CAPS capsule; Take 1 capsule (50,000 Units total) by mouth every 7 (seven) days.  Dispense: 4 capsule; Refill: 0  3. Emotional Eating Behavior Adriene will continue Wellbutrin SR, and we will refill for 1 month.  - buPROPion (WELLBUTRIN SR) 200 MG 12 hr tablet; Take 1 tablet (200 mg total) by mouth 2  (two) times daily.  Dispense: 60 tablet; Refill: 0  4. BMI 50.0-59.9, adult ([place])  5. Obesity, Beginning BMI 56.88 Bethia is currently in the action stage of change. As such, her goal is to continue with weight loss efforts. She has agreed to keeping a food journal and adhering to recommended goals of 1500-1700 calories and 100+ grams of protein daily.   Exercise goals: As is.   Behavioral modification strategies: increasing lean protein intake and meal planning and cooking strategies.  Catriona has agreed to follow-up with our clinic in 2 weeks. She was informed of the importance of frequent follow-up visits to maximize her success with intensive lifestyle modifications for her multiple health conditions.   Objective:   Blood pressure 118/88, pulse 95, temperature 97.6 F (36.4 C), height 5\' 2"  (1.575 m), weight 289 lb (131.1 kg), SpO2 95 %. Body mass index is 52.86 kg/m.  Lab Results  Component Value Date   CREATININE 0.92 04/20/2022   BUN 9 04/20/2022   NA 136 04/20/2022   K 3.9 04/20/2022   CL 104 04/20/2022   CO2 26 04/20/2022   Lab Results  Component Value Date   ALT 21 02/20/2022   AST 22 02/20/2022   ALKPHOS 92 02/20/2022   BILITOT 0.4 02/20/2022   Lab Results  Component Value Date   HGBA1C 5.4 02/20/2022   HGBA1C 5.4 12/21/2021   HGBA1C 5.4 06/15/2021   Lab Results  Component Value Date   INSULIN 12.2 02/20/2022   INSULIN 21.5 12/21/2021   INSULIN 20.6 06/15/2021   Lab Results  Component Value Date  TSH 1.610 02/20/2022   Lab Results  Component Value Date   CHOL 216 (H) 02/20/2022   HDL 44 02/20/2022   LDLCALC 146 (H) 02/20/2022   TRIG 145 02/20/2022   CHOLHDL 4.4 06/15/2021   Lab Results  Component Value Date   VD25OH 39.7 02/20/2022   VD25OH 42.3 12/21/2021   VD25OH 18.9 (L) 06/15/2021   Lab Results  Component Value Date   WBC 9.3 04/20/2022   HGB 14.5 04/20/2022   HCT 41.8 04/20/2022   MCV 89.3 04/20/2022   PLT 326 04/20/2022   No  results found for: "IRON", "TIBC", "FERRITIN"  Attestation Statements:   Reviewed by clinician on day of visit: allergies, medications, problem list, medical history, surgical history, family history, social history, and previous encounter notes.  I have personally spent 44 minutes total time today in preparation, patient care, and documentation for this visit, including the following: review of clinical lab tests; review of medical tests/procedures/services.   I, Trixie Dredge, am acting as transcriptionist for Dennard Nip, MD.  I have reviewed the above documentation for accuracy and completeness, and I agree with the above. -  Dennard Nip, MD

## 2022-07-19 ENCOUNTER — Other Ambulatory Visit (INDEPENDENT_AMBULATORY_CARE_PROVIDER_SITE_OTHER): Payer: Self-pay | Admitting: Family Medicine

## 2022-07-19 ENCOUNTER — Ambulatory Visit (INDEPENDENT_AMBULATORY_CARE_PROVIDER_SITE_OTHER): Payer: BC Managed Care – PPO | Admitting: Orthopaedic Surgery

## 2022-07-19 ENCOUNTER — Ambulatory Visit (INDEPENDENT_AMBULATORY_CARE_PROVIDER_SITE_OTHER): Payer: BC Managed Care – PPO | Admitting: Family Medicine

## 2022-07-19 ENCOUNTER — Encounter (INDEPENDENT_AMBULATORY_CARE_PROVIDER_SITE_OTHER): Payer: Self-pay | Admitting: Family Medicine

## 2022-07-19 VITALS — BP 137/89 | HR 93 | Temp 97.6°F | Ht 62.0 in | Wt 292.0 lb

## 2022-07-19 DIAGNOSIS — E559 Vitamin D deficiency, unspecified: Secondary | ICD-10-CM | POA: Diagnosis not present

## 2022-07-19 DIAGNOSIS — E669 Obesity, unspecified: Secondary | ICD-10-CM

## 2022-07-19 DIAGNOSIS — F39 Unspecified mood [affective] disorder: Secondary | ICD-10-CM

## 2022-07-19 DIAGNOSIS — R632 Polyphagia: Secondary | ICD-10-CM | POA: Diagnosis not present

## 2022-07-19 DIAGNOSIS — M23322 Other meniscus derangements, posterior horn of medial meniscus, left knee: Secondary | ICD-10-CM

## 2022-07-19 DIAGNOSIS — Z6841 Body Mass Index (BMI) 40.0 and over, adult: Secondary | ICD-10-CM

## 2022-07-19 DIAGNOSIS — G4733 Obstructive sleep apnea (adult) (pediatric): Secondary | ICD-10-CM | POA: Diagnosis not present

## 2022-07-19 MED ORDER — ZEPBOUND 2.5 MG/0.5ML ~~LOC~~ SOAJ: 2.5000 mg | SUBCUTANEOUS | 0 refills | Status: DC

## 2022-07-19 MED ORDER — BUPROPION HCL ER (SR) 200 MG PO TB12: 200.0000 mg | ORAL_TABLET | Freq: Two times a day (BID) | ORAL | 0 refills | Status: DC

## 2022-07-19 MED ORDER — VITAMIN D (ERGOCALCIFEROL) 1.25 MG (50000 UNIT) PO CAPS: 50000.0000 [IU] | ORAL_CAPSULE | ORAL | 0 refills | Status: DC

## 2022-07-19 NOTE — Progress Notes (Signed)
Post Operative Evaluation    Procedure/Date of Surgery: Left knee meniscal medial root repair 12/28  Interval History:   Presents today 12 weeks status post left knee medial meniscal root repair.  Overall she is doing well.  She is continuing to work on Hotel manager.  Range of motion is improving.  She is back to essentially normalize gait and able to play with her dog.  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   SOB (shortness of breath) on exertion    Past Surgical History:  Procedure Laterality Date   APPENDECTOMY  1998   cyst rupture  1998   Ovanrian cyst rupture   EYE SURGERY N/A 2015   lasik   KNEE ARTHROSCOPY WITH MENISCAL REPAIR Left 04/27/2022   Procedure: LEFT KNEE ARTHROSCOPY WITH MEDIAL MENISCAL REPAIR;  Surgeon: Vanetta Mulders, MD;  Location: [facility];  Service: Orthopedics;  Laterality: Left;   Social History   Socioeconomic History   Marital status: Significant Other    Spouse name: Not on file   Number of children: Not on file   Years of education: Not on file   Highest education level: Not on file  Occupational History   Not on file  Tobacco Use   Smoking status: Former    Types: Cigarettes    Quit date: 2001    Years since quitting: 23.2   Smokeless tobacco: Not on file  Vaping Use   Vaping Use: Never used  Substance and Sexual Activity   Alcohol use: Yes    Comment: rarely   Drug use: No   Sexual activity: Yes  Other Topics Concern   Not on file  Social History Narrative   Not on file   Social Determinants  of Health   Financial Resource Strain: Not on file  Food Insecurity: Not on file  Transportation Needs: Not on file  Physical Activity: Not on file  Stress: Not on file  Social Connections: Not on file   Family History  Problem Relation Age of Onset   Hyperlipidemia Mother    Hypertension Mother    Diabetes Mother    Depression Mother    Anxiety disorder Mother    Obesity Mother    Depression Father    Hyperlipidemia Father    Hypertension Father    Sleep apnea Father    Allergies  Allergen Reactions   Wasp Venom Anaphylaxis   Codeine     GI issues   Sulfa Antibiotics Nausea And Vomiting   Sulfa Drugs Cross Reactors    Current Outpatient Medications  Medication Sig Dispense Refill   buPROPion (WELLBUTRIN SR) 200 MG 12 hr tablet Take 1 tablet (200 mg total) by mouth 2 (two) times daily. 60 tablet 0   citalopram (CELEXA) 20 MG tablet Take 30 mg by mouth at bedtime.  clobetasol ointment (TEMOVATE) [id number] % Apply 1 Application topically 2 (two) times daily.     EPINEPHrine 0.3 mg/0.3 mL IJ SOAJ injection Inject 0.3 mg into the muscle as needed for anaphylaxis.     hydrochlorothiazide (HYDRODIURIL) 25 MG tablet Take 25 mg by mouth daily.     loratadine (CLARITIN) 10 MG tablet Take 10 mg by mouth daily.     losartan (COZAAR) 100 MG tablet TAKE 1 TABLET BY MOUTH ONCE DAILY 15 tablet 0   Multiple Vitamin (MULTIVITAMIN) tablet Take 1 tablet by mouth daily.     mupirocin ointment (BACTROBAN) 2 % Apply 1 Application topically daily.     ondansetron (ZOFRAN ODT) 8 MG disintegrating tablet Take 1 tablet (8 mg total) by mouth every 8 (eight) hours as needed for nausea or vomiting. 20 tablet 0   tacrolimus (PROTOPIC) 0.1 % ointment Apply 1 Application topically 2 (two) times daily.     Vitamin D, Ergocalciferol, (DRISDOL) 1.25 MG (50000 UNIT) CAPS capsule Take 1 capsule (50,000 Units total) by mouth every 7 (seven) days. 4 capsule 0   No current facility-administered medications for  this visit.   No results found.  Review of Systems:   A ROS was performed including pertinent positives and negatives as documented in the HPI.   Musculoskeletal Exam:      Left knee incisions are healed.  There is some scar around the tibial incision.  Range of motion is from 0 to 135.  No joint line tenderness.  Swelling improved.  Distal neurosensory exam intact  Imaging:      I personally reviewed and interpreted the radiographs.   Assessment:   12 weeks status post left medial meniscal root repair overall doing well.  Overall she is continuing to improve she is working on strengthening with physical therapy.  She will continue to progress with physical therapy and I will see her back in 3 months for reassessment and possibly final check Plan :    -Return to clinic in 12 weeks for reassessment      I personally saw and evaluated the patient, and participated in the management and treatment plan.  Vanetta Mulders, MD Attending Physician, Orthopedic Surgery  This document was dictated using Dragon voice recognition software. A reasonable attempt at proof reading has been made to minimize errors.

## 2022-07-20 ENCOUNTER — Telehealth: Payer: Self-pay

## 2022-07-20 NOTE — Telephone Encounter (Signed)
Started PA for [employer]ep[employer] via covermymeds.

## 2022-07-24 ENCOUNTER — Encounter (HOSPITAL_BASED_OUTPATIENT_CLINIC_OR_DEPARTMENT_OTHER): Payer: Self-pay

## 2022-07-24 ENCOUNTER — Ambulatory Visit (HOSPITAL_BASED_OUTPATIENT_CLINIC_OR_DEPARTMENT_OTHER): Payer: BC Managed Care – PPO | Admitting: Physical Therapy

## 2022-07-24 NOTE — Progress Notes (Unsigned)
Chief Complaint:   OBESITY Tara Kelley is here to discuss her progress with her obesity treatment plan along with follow-up of her obesity related diagnoses. Tara Kelley is on keeping a food journal and adhering to recommended goals of 1500-1700 calories and 100+ grams of protein and states she is following her eating plan approximately 25% of the time. Tara Kelley states she is doing 0 minutes 0 times per week.  Today's visit was #: 20 Starting weight: 311 lbs Starting date: 06/15/2021 Today's weight: 292 lbs Today's date: 07/19/2022 Total lbs lost to date: 19 Total lbs lost since last in-office visit: 0  Interim History: Tara Kelley is struggle more with weight loss. She hasn't been able to journal as much, and she is feeling frustrated at how difficult her weight loss has been.   Subjective:   1. Polyphagia Tara Kelley is struggling with increased polyphagia. She had been on Mounjaro.   2. Vitamin D deficiency Tara Kelley is on Vitamin D, and she still notes fatigue. No side effects were noted.   3. OSA (obstructive sleep apnea) Tara Kelley is using her mouth appliance regularly, but she still notes significant daily somnolence and her blood pressure is elevated today.   4. Emotional Eating Behavior Tara Kelley is struggling more with increased emotional eating behavior, and she is feeling more frustration.   Assessment/Plan:   1. Polyphagia Tara Kelley agreed to start Zepbound 2.5 mg once weekly with no refills.   - tirzepatide (ZEPBOUND) 2.5 MG/0.5ML Pen; Inject 2.5 mg into the skin once a week.  Dispense: 2 mL; Refill: 0  2. Vitamin D deficiency Tara Kelley will continue prescription Vitamin D, and we will refill for 1 month.   - Vitamin D, Ergocalciferol, (DRISDOL) 1.25 MG (50000 UNIT) CAPS capsule; Take 1 capsule (50,000 Units total) by mouth every 7 (seven) days.  Dispense: 4 capsule; Refill: 0  3. OSA (obstructive sleep apnea) Tara Kelley was encouraged to contact her sleep doctor to discuss if this treatment is  adequate.   4. Emotional Eating Behavior We will refill Wellbutrin SR for 1 month. Emotional eating behavior strategies were discussed and patient was offered support and encouragement. We will follow closely.   - buPROPion (WELLBUTRIN SR) 200 MG 12 hr tablet; Take 1 tablet (200 mg total) by mouth 2 (two) times daily.  Dispense: 60 tablet; Refill: 0  5. BMI 50.0-59.9, adult (Tara Kelley)  6. Obesity, Beginning BMI 56.88 Tara Kelley is currently in the action stage of change. As such, her goal is to continue with weight loss efforts. She has agreed to keeping a food journal and adhering to recommended goals of 1500-1700 calories and 100+ grams of protein daily.   Behavioral modification strategies: increasing lean protein intake.  Tara Kelley has agreed to follow-up with our clinic in 2 weeks. She was informed of the importance of frequent follow-up visits to maximize her success with intensive lifestyle modifications for her multiple health conditions.   Objective:   Blood pressure 137/89, pulse 93, temperature 97.6 F (36.4 C), height 5\' 2"  (1.575 m), weight 292 lb (132.5 kg), SpO2 96 %. Body mass index is 53.41 kg/m.  Lab Results  Component Value Date   CREATININE 0.92 04/20/2022   BUN 9 04/20/2022   NA 136 04/20/2022   K 3.9 04/20/2022   CL 104 04/20/2022   CO2 26 04/20/2022   Lab Results  Component Value Date   ALT 21 02/20/2022   AST 22 02/20/2022   ALKPHOS 92 02/20/2022   BILITOT 0.4 02/20/2022   Lab Results  Component Value Date   HGBA1C 5.4 02/20/2022   HGBA1C 5.4 12/21/2021   HGBA1C 5.4 06/15/2021   Lab Results  Component Value Date   INSULIN 12.2 02/20/2022   INSULIN 21.5 12/21/2021   INSULIN 20.6 06/15/2021   Lab Results  Component Value Date   TSH 1.610 02/20/2022   Lab Results  Component Value Date   CHOL 216 (H) 02/20/2022   HDL 44 02/20/2022   LDLCALC 146 (H) 02/20/2022   TRIG 145 02/20/2022   CHOLHDL 4.4 06/15/2021   Lab Results  Component Value Date    VD25OH 39.7 02/20/2022   VD25OH 42.3 12/21/2021   VD25OH 18.9 (L) 06/15/2021   Lab Results  Component Value Date   WBC 9.3 04/20/2022   HGB 14.5 04/20/2022   HCT 41.8 04/20/2022   MCV 89.3 04/20/2022   PLT 326 04/20/2022   No results found for: "IRON", "TIBC", "FERRITIN"  Attestation Statements:   Reviewed by clinician on day of visit: allergies, medications, problem list, medical history, surgical history, family history, social history, and previous encounter notes.  Time spent on visit including pre-visit chart review and post-visit care and charting was 47 minutes.   I, Trixie Dredge, am acting as transcriptionist for Dennard Nip, MD.  I have reviewed the above documentation for accuracy and completeness, and I agree with the above. -  Dennard Nip, MD

## 2022-07-31 ENCOUNTER — Ambulatory Visit (HOSPITAL_BASED_OUTPATIENT_CLINIC_OR_DEPARTMENT_OTHER): Payer: BC Managed Care – PPO | Attending: Orthopaedic Surgery | Admitting: Physical Therapy

## 2022-07-31 ENCOUNTER — Encounter (HOSPITAL_BASED_OUTPATIENT_CLINIC_OR_DEPARTMENT_OTHER): Payer: Self-pay | Admitting: Physical Therapy

## 2022-07-31 DIAGNOSIS — R262 Difficulty in walking, not elsewhere classified: Secondary | ICD-10-CM | POA: Insufficient documentation

## 2022-07-31 DIAGNOSIS — M25562 Pain in left knee: Secondary | ICD-10-CM | POA: Diagnosis not present

## 2022-07-31 DIAGNOSIS — M6281 Muscle weakness (generalized): Secondary | ICD-10-CM | POA: Insufficient documentation
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
## 2022-08-03 ENCOUNTER — Telehealth: Payer: Self-pay

## 2022-08-03 ENCOUNTER — Ambulatory Visit (INDEPENDENT_AMBULATORY_CARE_PROVIDER_SITE_OTHER): Payer: BC Managed Care – PPO | Admitting: Family Medicine

## 2022-08-03 ENCOUNTER — Encounter (INDEPENDENT_AMBULATORY_CARE_PROVIDER_SITE_OTHER): Payer: Self-pay | Admitting: Family Medicine

## 2022-08-03 VITALS — BP 106/71 | HR 97 | Temp 97.4°F | Ht 62.0 in | Wt 291.0 lb

## 2022-08-03 DIAGNOSIS — E669 Obesity, unspecified: Secondary | ICD-10-CM

## 2022-08-03 DIAGNOSIS — R632 Polyphagia: Secondary | ICD-10-CM | POA: Diagnosis not present

## 2022-08-03 DIAGNOSIS — Z6841 Body Mass Index (BMI) 40.0 and over, adult: Secondary | ICD-10-CM

## 2022-08-03 NOTE — Telephone Encounter (Signed)
[employer]eb[employer] approved 07/20/2022 until 01/16/2023.

## 2022-08-07 NOTE — Progress Notes (Unsigned)
Chief Complaint:   OBESITY Tara Kelley is here to discuss her progress with her obesity treatment plan along with follow-up of her obesity related diagnoses. Tara Kelley is on keeping a food journal and adhering to recommended goals of 1500-1700 calories and 100+ grams of protein and states she is following her eating plan approximately 70% of the time. Tara Kelley states she is doing strengthening and cardio for 45 minutes 4 times per week.  Today's visit was #: 21 Starting weight: 311 lbs Starting date: 06/15/2021 Today's weight: 291 lbs Today's date: 08/03/2022 Total lbs lost to date: 20 Total lbs lost since last in-office visit: 1  Interim History: Tara Kelley is doing better with meeting her protein goals. She is doing better with meal planning and cooking.   Subjective:   1. Polyphagia Tara Kelley has not started Zepbound yet due to insurance.  She expects to start this soon with her coupon.  She has questions about what to expect from the medicine.  Assessment/Plan:   1. Polyphagia Tara Kelley's questions were all answered, and she was educated on what to expect.  She will continue Zepbound at 2.5 mg, and we will follow-up in 2 weeks.  2. BMI 50.0-59.9, adult  3. Obesity, Beginning BMI 56.88 Tara Kelley is currently in the action stage of change. As such, her goal is to continue with weight loss efforts. She has agreed to keeping a food journal and adhering to recommended goals of 1500-1700 calories and 100+ grams of protein daily.   Exercise goals: As is.   Behavioral modification strategies: increasing lean protein intake, meal planning and cooking strategies, and travel eating strategies.  Tara Kelley has agreed to follow-up with our clinic in 2 to 3 weeks. She was informed of the importance of frequent follow-up visits to maximize her success with intensive lifestyle modifications for her multiple health conditions.   Objective:   Blood pressure 106/71, pulse 97, temperature (!) 97.4 F (36.3 C),  height 5\' 2"  (1.575 m), weight 291 lb (132 kg), SpO2 93 %. Body mass index is 53.22 kg/m.  Lab Results  Component Value Date   CREATININE 0.92 04/20/2022   BUN 9 04/20/2022   NA 136 04/20/2022   K 3.9 04/20/2022   CL 104 04/20/2022   CO2 26 04/20/2022   Lab Results  Component Value Date   ALT 21 02/20/2022   AST 22 02/20/2022   ALKPHOS 92 02/20/2022   BILITOT 0.4 02/20/2022   Lab Results  Component Value Date   HGBA1C 5.4 02/20/2022   HGBA1C 5.4 12/21/2021   HGBA1C 5.4 06/15/2021   Lab Results  Component Value Date   INSULIN 12.2 02/20/2022   INSULIN 21.5 12/21/2021   INSULIN 20.6 06/15/2021   Lab Results  Component Value Date   TSH 1.610 02/20/2022   Lab Results  Component Value Date   CHOL 216 (H) 02/20/2022   HDL 44 02/20/2022   LDLCALC 146 (H) 02/20/2022   TRIG 145 02/20/2022   CHOLHDL 4.4 06/15/2021   Lab Results  Component Value Date   VD25OH 39.7 02/20/2022   VD25OH 42.3 12/21/2021   VD25OH 18.9 (L) 06/15/2021   Lab Results  Component Value Date   WBC 9.3 04/20/2022   HGB 14.5 04/20/2022   HCT 41.8 04/20/2022   MCV 89.3 04/20/2022   PLT 326 04/20/2022   No results found for: "IRON", "TIBC", "FERRITIN"  Attestation Statements:   Reviewed by clinician on day of visit: allergies, medications, problem list, medical history, surgical history, family history, social  history, and previous encounter notes.  Time spent on visit including pre-visit chart review and post-visit care and charting was 40 minutes.   I, Burt Knack, am acting as transcriptionist for Quillian Quince, MD.  I have reviewed the above documentation for accuracy and completeness, and I agree with the above. -  Quillian Quince, MD

## 2022-08-15 ENCOUNTER — Encounter (HOSPITAL_BASED_OUTPATIENT_CLINIC_OR_DEPARTMENT_OTHER): Payer: Self-pay | Admitting: Physical Therapy

## 2022-08-15 ENCOUNTER — Ambulatory Visit (HOSPITAL_BASED_OUTPATIENT_CLINIC_OR_DEPARTMENT_OTHER): Payer: BC Managed Care – PPO | Admitting: Physical Therapy

## 2022-08-15 DIAGNOSIS — M6281 Muscle weakness (generalized): Secondary | ICD-10-CM

## 2022-08-15 DIAGNOSIS — R262 Difficulty in walking, not elsewhere classified: Secondary | ICD-10-CM | POA: Diagnosis not present

## 2022-08-15 DIAGNOSIS — M25562 Pain in left knee: Secondary | ICD-10-CM | POA: Diagnosis not present
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
## 2022-08-17 ENCOUNTER — Ambulatory Visit (INDEPENDENT_AMBULATORY_CARE_PROVIDER_SITE_OTHER): Payer: BC Managed Care – PPO | Admitting: Family Medicine

## 2022-08-17 ENCOUNTER — Encounter (INDEPENDENT_AMBULATORY_CARE_PROVIDER_SITE_OTHER): Payer: Self-pay | Admitting: Family Medicine

## 2022-08-17 ENCOUNTER — Other Ambulatory Visit (HOSPITAL_BASED_OUTPATIENT_CLINIC_OR_DEPARTMENT_OTHER): Payer: Self-pay

## 2022-08-17 VITALS — BP 120/83 | HR 101 | Temp 97.4°F | Ht 62.0 in | Wt 289.0 lb

## 2022-08-17 DIAGNOSIS — R632 Polyphagia: Secondary | ICD-10-CM

## 2022-08-17 DIAGNOSIS — Z6841 Body Mass Index (BMI) 40.0 and over, adult: Secondary | ICD-10-CM

## 2022-08-17 DIAGNOSIS — E559 Vitamin D deficiency, unspecified: Secondary | ICD-10-CM

## 2022-08-17 DIAGNOSIS — E669 Obesity, unspecified: Secondary | ICD-10-CM | POA: Diagnosis not present

## 2022-08-17 DIAGNOSIS — F39 Unspecified mood [affective] disorder: Secondary | ICD-10-CM

## 2022-08-17 MED ORDER — ZEPBOUND 2.5 MG/0.5ML ~~LOC~~ SOAJ
2.5000 mg | SUBCUTANEOUS | 0 refills | Status: DC
  Filled 2022-08-17 – 2022-08-25 (×2): qty 2, 28d supply, fill #0

## 2022-08-17 MED ORDER — VITAMIN D (ERGOCALCIFEROL) 1.25 MG (50000 UNIT) PO CAPS: 50000.0000 [IU] | ORAL_CAPSULE | ORAL | 0 refills | Status: DC

## 2022-08-17 MED ORDER — BUPROPION HCL ER (SR) 200 MG PO TB12: 200.0000 mg | ORAL_TABLET | Freq: Two times a day (BID) | ORAL | 0 refills | Status: DC

## 2022-08-22 NOTE — Progress Notes (Signed)
Chief Complaint:   OBESITY Tara Kelley is here to discuss her progress with her obesity treatment plan along with follow-up of her obesity related diagnoses. Tara Kelley is on keeping a food journal and adhering to recommended goals of 1500-1700 calories and 100+ grams of protein and states she is following her eating plan approximately 70% of the time. Tara Kelley states she is doing strength and cardio for 30-45 minutes 3 times per week.  Today's visit was #: 22 Starting weight: 311 lbs Starting date: 06/15/2021 Today's weight: 289 lbs Today's date: 08/17/2022 Total lbs lost to date: 22 Total lbs lost since last in-office visit: 2  Interim History: Tara Kelley continues to do well with her weight loss. Her hunger is controlled and she is working on Scientist, water quality exercises. She feels well overall and she is doing well with meal planning.   Subjective:   1. Polyphagia Tara Kelley started Zepbound at 2.5 mg and she is doing well. She notes decreased polyphagia. She had 1 day of mild nausea, but this has resolved.   2. Vitamin D deficiency Tara Kelley is on Vitamin D, but her level is not yet at goal.   3. Emotional Eating Behavior Tara Kelley is doing well with decreasing emotional eating behavior. She feels her mood and energy is better, and she denies insomnia.   Assessment/Plan:   1. Polyphagia Tara Kelley will continue Zepbound 2.5 mg, and we will refill for 1 month.   - tirzepatide (ZEPBOUND) 2.5 MG/0.5ML Pen; Inject 2.5 mg into the skin once a week.  Dispense: 2 mL; Refill: 0  2. Vitamin D deficiency Tara Kelley will continue prescription Vitamin D, and we will refill for 1 month.   - Vitamin D, Ergocalciferol, (DRISDOL) 1.25 MG (50000 UNIT) CAPS capsule; Take 1 capsule (50,000 Units total) by mouth every 7 (seven) days.  Dispense: 4 capsule; Refill: 0  3. Emotional Eating Behavior Tara Kelley will continue Wellbutrin SR and Celexa. We will refill Wellbutrin SR for 1 month.   - buPROPion (WELLBUTRIN SR)  200 MG 12 hr tablet; Take 1 tablet (200 mg total) by mouth 2 (two) times daily.  Dispense: 60 tablet; Refill: 0  4. BMI 50.0-59.9, adult  5. Obesity, Beginning BMI 56.88 Tara Kelley is currently in the action stage of change. As such, her goal is to continue with weight loss efforts. She has agreed to keeping a food journal and adhering to recommended goals of 1500-1700 calories and 100+ grams of protein daily.   Exercise goals: As is.   Behavioral modification strategies: increasing lean protein intake.  Tara Kelley has agreed to follow-up with our clinic in 2 to 3 weeks. She was informed of the importance of frequent follow-up visits to maximize her success with intensive lifestyle modifications for her multiple health conditions.   Objective:   Blood pressure 120/83, pulse (!) 101, temperature (!) 97.4 F (36.3 C), height  (1.575 m), weight 289 lb (131.1 kg), SpO2 98 %. Body mass index is 52.86 kg/m.  Lab Results  Component Value Date   CREATININE 0.92 04/20/2022   BUN 9 04/20/2022   NA 136 04/20/2022   K 3.9 04/20/2022   CL 104 04/20/2022   CO2 26 04/20/2022   Lab Results  Component Value Date   ALT 21 02/20/2022   AST 22 02/20/2022   ALKPHOS 92 02/20/2022   BILITOT 0.4 02/20/2022   Lab Results  Component Value Date   HGBA1C 5.4 02/20/2022   HGBA1C 5.4 12/21/2021   HGBA1C 5.4 06/15/2021   Lab Results  Component Value Date   INSULIN 12.2 02/20/2022   INSULIN 21.5 12/21/2021   INSULIN 20.6 06/15/2021   Lab Results  Component Value Date   TSH 1.610 02/20/2022   Lab Results  Component Value Date   CHOL 216 (H) 02/20/2022   HDL 44 02/20/2022   LDLCALC 146 (H) 02/20/2022   TRIG 145 02/20/2022   CHOLHDL 4.4 06/15/2021   Lab Results  Component Value Date   VD25OH 39.7 02/20/2022   VD25OH 42.3 12/21/2021   VD25OH 18.9 (L) 06/15/2021   Lab Results  Component Value Date   WBC 9.3 04/20/2022   HGB 14.5 04/20/2022   HCT 41.8 04/20/2022   MCV 89.3 04/20/2022    PLT 326 04/20/2022   No results found for: "IRON", "TIBC", "FERRITIN"  Attestation Statements:   Reviewed by clinician on day of visit: allergies, medications, problem list, medical history, surgical history, family history, social history, and previous encounter notes.   I, Burt Knack, am acting as transcriptionist for Quillian Quince, MD.  I have reviewed the above documentation for accuracy and completeness, and I agree with the above. -  Quillian Quince, MD

## 2022-08-25 ENCOUNTER — Other Ambulatory Visit (HOSPITAL_BASED_OUTPATIENT_CLINIC_OR_DEPARTMENT_OTHER): Payer: Self-pay

## 2022-08-28 ENCOUNTER — Ambulatory Visit (HOSPITAL_BASED_OUTPATIENT_CLINIC_OR_DEPARTMENT_OTHER): Payer: BC Managed Care – PPO | Admitting: Physical Therapy

## 2022-08-28 ENCOUNTER — Encounter (HOSPITAL_BASED_OUTPATIENT_CLINIC_OR_DEPARTMENT_OTHER): Payer: Self-pay | Admitting: Physical Therapy

## 2022-08-28 DIAGNOSIS — M6281 Muscle weakness (generalized): Secondary | ICD-10-CM | POA: Diagnosis not present

## 2022-08-28 DIAGNOSIS — M25562 Pain in left knee: Secondary | ICD-10-CM | POA: Diagnosis not present

## 2022-08-28 DIAGNOSIS — R262 Difficulty in walking, not elsewhere classified: Secondary | ICD-10-CM | POA: Diagnosis not present
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
## 2022-08-31 ENCOUNTER — Ambulatory Visit (INDEPENDENT_AMBULATORY_CARE_PROVIDER_SITE_OTHER): Payer: BC Managed Care – PPO | Admitting: Family Medicine

## 2022-08-31 ENCOUNTER — Other Ambulatory Visit (HOSPITAL_BASED_OUTPATIENT_CLINIC_OR_DEPARTMENT_OTHER): Payer: Self-pay

## 2022-08-31 ENCOUNTER — Encounter (INDEPENDENT_AMBULATORY_CARE_PROVIDER_SITE_OTHER): Payer: Self-pay | Admitting: Family Medicine

## 2022-08-31 VITALS — BP 111/80 | HR 1 | Temp 97.5°F | Ht 62.0 in | Wt 287.0 lb

## 2022-08-31 DIAGNOSIS — R632 Polyphagia: Secondary | ICD-10-CM

## 2022-08-31 DIAGNOSIS — E6609 Other obesity due to excess calories: Secondary | ICD-10-CM | POA: Diagnosis not present

## 2022-08-31 DIAGNOSIS — Z6841 Body Mass Index (BMI) 40.0 and over, adult: Secondary | ICD-10-CM

## 2022-08-31 DIAGNOSIS — E559 Vitamin D deficiency, unspecified: Secondary | ICD-10-CM

## 2022-08-31 DIAGNOSIS — F39 Unspecified mood [affective] disorder: Secondary | ICD-10-CM | POA: Diagnosis not present

## 2022-08-31 MED ORDER — BUPROPION HCL ER (SR) 200 MG PO TB12
200.0000 mg | ORAL_TABLET | Freq: Two times a day (BID) | ORAL | 0 refills | Status: DC
Start: 2022-08-31 — End: 2022-09-14
  Filled 2022-08-31: qty 60, 30d supply, fill #0

## 2022-08-31 MED ORDER — ZEPBOUND 2.5 MG/0.5ML ~~LOC~~ SOAJ
2.5000 mg | SUBCUTANEOUS | 0 refills | Status: DC
Start: 2022-08-31 — End: 2022-09-14

## 2022-08-31 MED ORDER — VITAMIN D (ERGOCALCIFEROL) 1.25 MG (50000 UNIT) PO CAPS
50000.0000 [IU] | ORAL_CAPSULE | ORAL | 0 refills | Status: DC
  Filled 2022-08-31: qty 4, 28d supply, fill #0

## 2022-09-04 NOTE — Progress Notes (Signed)
Chief Complaint:   OBESITY Tara Kelley is here to discuss her progress with her obesity treatment plan along with follow-up of her obesity related diagnoses. Tara Kelley is on keeping a food journal and adhering to recommended goals of 1500-1700 calories and 100+ grams of protein and states she is following her eating plan approximately 60% of the time. Tara Kelley states she is strengthen training for 30 minutes 3 times per week.  Today's visit was #: 23 Starting weight: 311 lbs Starting date: 06/15/2021 Today's weight: 287 lbs Today's date: 08/31/2022 Total lbs lost to date: 24 Total lbs lost since last in-office visit: 2  Interim History: Tara Kelley continues to do  well with her weight loss even on vacation. She did better with portion control and increased activity. Her hunger is better controlled. She still struggles with knee pain, but she is doing physical therapy.   Subjective:   1. Vitamin D deficiency Tara Kelley is on Vitamin D prescription, and she denies nausea, vomiting, or muscle weakness.   2. Polyphagia Tara Kelley notes decreased polyphagia on Zepbound with no nausea or vomiting. She has been able to portion control better and she feels more satisfied.   3. Emotional Eating Behavior Tara Kelley is working on decreasing emotional eating behaviors, and she is doing well on her Wellbutrin. She denies insomnia and her blood pressure is within normal limits.   Assessment/Plan:   1. Vitamin D deficiency Tara Kelley will continue prescription vitamin D, and we will refill for 1 month.  - Vitamin D, Ergocalciferol, (DRISDOL) 1.25 MG (50000 UNIT) CAPS capsule; Take 1 capsule (50,000 Units total) by mouth every 7 (seven) days.  Dispense: 4 capsule; Refill: 0  2. Polyphagia Tara Kelley will continue Zepbound at 2.5 mg, and we will refill for 1 month.  - tirzepatide (ZEPBOUND) 2.5 MG/0.5ML Pen; Inject 2.5 mg into the skin once a week.  Dispense: 2 mL; Refill: 0  3. Emotional Eating Behavior Tara Kelley will  continue Wellbutrin SR, and we will refill for 1 month.  - buPROPion (WELLBUTRIN SR) 200 MG 12 hr tablet; Take 1 tablet (200 mg total) by mouth 2 (two) times daily.  Dispense: 60 tablet; Refill: 0  4. BMI 50.0-59.9, adult (HCC)  5. Obesity, Beginning BMI 56.88 Tara Kelley is currently in the action stage of change. As such, her goal is to continue with weight loss efforts. She has agreed to keeping a food journal and adhering to recommended goals of 1500-1700 calories and 100+ grams of protein daily.   Exercise goals: As is.   Behavioral modification strategies: increasing lean protein intake.  Tara Kelley has agreed to follow-up with our clinic in 4 weeks. She was informed of the importance of frequent follow-up visits to maximize her success with intensive lifestyle modifications for her multiple health conditions.   Objective:   Blood pressure 111/80, pulse (!) 1, temperature (!) 97.5 F (36.4 C), height 5\' 2"  (1.575 m), weight 287 lb (130.2 kg). Body mass index is 52.49 kg/m.  Lab Results  Component Value Date   CREATININE 0.92 04/20/2022   BUN 9 04/20/2022   NA 136 04/20/2022   K 3.9 04/20/2022   CL 104 04/20/2022   CO2 26 04/20/2022   Lab Results  Component Value Date   ALT 21 02/20/2022   AST 22 02/20/2022   ALKPHOS 92 02/20/2022   BILITOT 0.4 02/20/2022   Lab Results  Component Value Date   HGBA1C 5.4 02/20/2022   HGBA1C 5.4 12/21/2021   HGBA1C 5.4 06/15/2021   Lab Results  Component Value Date   INSULIN 12.2 02/20/2022   INSULIN 21.5 12/21/2021   INSULIN 20.6 06/15/2021   Lab Results  Component Value Date   TSH 1.610 02/20/2022   Lab Results  Component Value Date   CHOL 216 (H) 02/20/2022   HDL 44 02/20/2022   LDLCALC 146 (H) 02/20/2022   TRIG 145 02/20/2022   CHOLHDL 4.4 06/15/2021   Lab Results  Component Value Date   VD25OH 39.7 02/20/2022   VD25OH 42.3 12/21/2021   VD25OH 18.9 (L) 06/15/2021   Lab Results  Component Value Date   WBC 9.3  04/20/2022   HGB 14.5 04/20/2022   HCT 41.8 04/20/2022   MCV 89.3 04/20/2022   PLT 326 04/20/2022   No results found for: "IRON", "TIBC", "FERRITIN"  Attestation Statements:   Reviewed by clinician on day of visit: allergies, medications, problem list, medical history, surgical history, family history, social history, and previous encounter notes.   I, Burt Knack, am acting as transcriptionist for Quillian Quince, MD.  I have reviewed the above documentation for accuracy and completeness, and I agree with the above. -  Quillian Quince, MD

## 2022-09-11 ENCOUNTER — Ambulatory Visit (HOSPITAL_BASED_OUTPATIENT_CLINIC_OR_DEPARTMENT_OTHER): Payer: BC Managed Care – PPO | Attending: Orthopaedic Surgery | Admitting: Physical Therapy

## 2022-09-11 ENCOUNTER — Encounter (HOSPITAL_BASED_OUTPATIENT_CLINIC_OR_DEPARTMENT_OTHER): Payer: Self-pay | Admitting: Physical Therapy

## 2022-09-11 DIAGNOSIS — R262 Difficulty in walking, not elsewhere classified: Secondary | ICD-10-CM | POA: Diagnosis not present

## 2022-09-11 DIAGNOSIS — M6281 Muscle weakness (generalized): Secondary | ICD-10-CM

## 2022-09-11 DIAGNOSIS — M25562 Pain in left knee: Secondary | ICD-10-CM | POA: Diagnosis not present
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
## 2022-09-14 ENCOUNTER — Ambulatory Visit (INDEPENDENT_AMBULATORY_CARE_PROVIDER_SITE_OTHER): Payer: BC Managed Care – PPO | Admitting: Family Medicine

## 2022-09-14 ENCOUNTER — Encounter (INDEPENDENT_AMBULATORY_CARE_PROVIDER_SITE_OTHER): Payer: Self-pay | Admitting: Family Medicine

## 2022-09-14 ENCOUNTER — Other Ambulatory Visit (HOSPITAL_BASED_OUTPATIENT_CLINIC_OR_DEPARTMENT_OTHER): Payer: Self-pay

## 2022-09-14 VITALS — BP 123/85 | HR 86 | Temp 98.3°F | Ht 62.0 in | Wt 288.0 lb

## 2022-09-14 DIAGNOSIS — E669 Obesity, unspecified: Secondary | ICD-10-CM | POA: Diagnosis not present

## 2022-09-14 DIAGNOSIS — Z6841 Body Mass Index (BMI) 40.0 and over, adult: Secondary | ICD-10-CM | POA: Diagnosis not present

## 2022-09-14 DIAGNOSIS — F39 Unspecified mood [affective] disorder: Secondary | ICD-10-CM

## 2022-09-14 DIAGNOSIS — R632 Polyphagia: Secondary | ICD-10-CM

## 2022-09-14 MED ORDER — BUPROPION HCL ER (SR) 200 MG PO TB12
200.0000 mg | ORAL_TABLET | Freq: Two times a day (BID) | ORAL | 0 refills | Status: DC
Start: 2022-09-14 — End: 2022-09-28
  Filled 2022-09-14 – 2022-09-15 (×2): qty 60, 30d supply, fill #0

## 2022-09-14 MED ORDER — ZEPBOUND 2.5 MG/0.5ML ~~LOC~~ SOAJ
2.5000 mg | SUBCUTANEOUS | 0 refills | Status: DC
Start: 2022-09-14 — End: 2023-01-10
  Filled 2022-09-14: qty 2, 28d supply, fill #0

## 2022-09-15 ENCOUNTER — Other Ambulatory Visit (HOSPITAL_BASED_OUTPATIENT_CLINIC_OR_DEPARTMENT_OTHER): Payer: Self-pay

## 2022-09-18 NOTE — Progress Notes (Unsigned)
Chief Complaint:   OBESITY Tara Kelley is here to discuss her progress with her obesity treatment plan along with follow-up of her obesity related diagnoses. Tara Kelley is on keeping a food journal and adhering to recommended goals of 1500-1700 calories and 100+ grams of protein and states she is following her eating plan approximately 50% of the time. Tara Kelley states she is doing strength training for 30 minutes 3 times per week.  Today's visit was #: 24 Starting weight: 311 lbs Starting date: 06/15/2021 Today's weight: 288 lbs Today's date: 09/14/2022 Total lbs lost to date: 23 Total lbs lost since last in-office visit: 0  Interim History: Clarkie has been traveling and eating out more.  She also notes some PMS symptoms.  She has not been able to follow her plan as closely, but she was still mindful of her food choices.  Subjective:   1. Polyphagia Tara Kelley is doing well on Zepbound, and her polyphagia has decreased with minimal GI upset.  2. Emotional Eating Behavior Tara Kelley is stable on Wellbutrin, and she notes increased emotional eating behavior around her cycle.  She has no medication side effects.  Assessment/Plan:   1. Polyphagia We will refill Zepbound for 1 month, and Tara Kelley will get back to journaling.   - tirzepatide (ZEPBOUND) 2.5 MG/0.5ML Pen; Inject 2.5 mg into the skin once a week.  Dispense: 2 mL; Refill: 0  2. Emotional Eating Behavior Tara Kelley will continue Wellbutrin SR, and we will refill for 1 month.  - buPROPion (WELLBUTRIN SR) 200 MG 12 hr tablet; Take 1 tablet (200 mg total) by mouth 2 (two) times daily.  Dispense: 60 tablet; Refill: 0  3. BMI 50.0-59.9, adult (HCC)  4. Obesity, Beginning BMI 56.88 Tara Kelley is currently in the action stage of change. As such, her goal is to continue with weight loss efforts. She has agreed to keeping a food journal and adhering to recommended goals of 1500-1700 calories and 100+ grams of protein daily.   Exercise goals: As is.    Behavioral modification strategies: increasing lean protein intake.  Tara Kelley has agreed to follow-up with our clinic in 3 to 4 weeks. She was informed of the importance of frequent follow-up visits to maximize her success with intensive lifestyle modifications for her multiple health conditions.   Objective:   Blood pressure 123/85, pulse 86, temperature 98.3 F (36.8 C), height 5\' 2"  (1.575 m), weight 288 lb (130.6 kg), SpO2 99 %. Body mass index is 52.68 kg/m.  Lab Results  Component Value Date   CREATININE 0.92 04/20/2022   BUN 9 04/20/2022   NA 136 04/20/2022   K 3.9 04/20/2022   CL 104 04/20/2022   CO2 26 04/20/2022   Lab Results  Component Value Date   ALT 21 02/20/2022   AST 22 02/20/2022   ALKPHOS 92 02/20/2022   BILITOT 0.4 02/20/2022   Lab Results  Component Value Date   HGBA1C 5.4 02/20/2022   HGBA1C 5.4 12/21/2021   HGBA1C 5.4 06/15/2021   Lab Results  Component Value Date   INSULIN 12.2 02/20/2022   INSULIN 21.5 12/21/2021   INSULIN 20.6 06/15/2021   Lab Results  Component Value Date   TSH 1.610 02/20/2022   Lab Results  Component Value Date   CHOL 216 (H) 02/20/2022   HDL 44 02/20/2022   LDLCALC 146 (H) 02/20/2022   TRIG 145 02/20/2022   CHOLHDL 4.4 06/15/2021   Lab Results  Component Value Date   VD25OH 39.7 02/20/2022   VD25OH  42.3 12/21/2021   VD25OH 18.9 (L) 06/15/2021   Lab Results  Component Value Date   WBC 9.3 04/20/2022   HGB 14.5 04/20/2022   HCT 41.8 04/20/2022   MCV 89.3 04/20/2022   PLT 326 04/20/2022   No results found for: "IRON", "TIBC", "FERRITIN"  Attestation Statements:   Reviewed by clinician on day of visit: allergies, medications, problem list, medical history, surgical history, family history, social history, and previous encounter notes.   I, Burt Knack, am acting as transcriptionist for Quillian Quince, MD.  I have reviewed the above documentation for accuracy and completeness, and I agree with the  above. -  Quillian Quince, MD

## 2022-09-20 ENCOUNTER — Ambulatory Visit (INDEPENDENT_AMBULATORY_CARE_PROVIDER_SITE_OTHER): Payer: BC Managed Care – PPO | Admitting: Orthopaedic Surgery

## 2022-09-20 DIAGNOSIS — M23322 Other meniscus derangements, posterior horn of medial meniscus, left knee: Secondary | ICD-10-CM | POA: Diagnosis not present

## 2022-09-20 NOTE — Progress Notes (Signed)
Post Operative Evaluation    Procedure/Date of Surgery: Left knee meniscal medial root repair 12/28  Interval History:   Presents with left knee follow-up for the above procedure.  She is experiencing some tightness in the calf as well as the hamstring.  She states that she has been having some pain initially with start up.  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   SOB (shortness of breath) on exertion    Past Surgical History:  Procedure Laterality Date   APPENDECTOMY  1998   cyst rupture  1998   Ovanrian cyst rupture   EYE SURGERY N/A 2015   lasik   KNEE ARTHROSCOPY WITH MENISCAL REPAIR Left 04/27/2022   Procedure: LEFT KNEE ARTHROSCOPY WITH MEDIAL MENISCAL REPAIR;  Surgeon: Huel Cote, MD;  Location: [facility] OR;  Service: Orthopedics;  Laterality: Left;   Social History   Socioeconomic History   Marital status: Significant Other    Spouse name: Not on file   Number of children: Not on file   Years of education: Not on file   Highest education level: Not on file  Occupational History   Not on file  Tobacco Use   Smoking status: Former    Types: Cigarettes    Quit date: 2001    Years since quitting: 23.2   Smokeless tobacco: Not on file  Vaping Use   Vaping Use: Never used  Substance and Sexual Activity   Alcohol use: Yes    Comment: rarely   Drug use: No   Sexual activity: Yes  Other Topics Concern   Not on file  Social History Narrative    Not on file   Social Determinants of Health   Financial Resource Strain: Not on file  Food Insecurity: Not on file  Transportation Needs: Not on file  Physical Activity: Not on file  Stress: Not on file  Social Connections: Not on file   Family History  Problem Relation Age of Onset   Hyperlipidemia Mother    Hypertension Mother    Diabetes Mother    Depression Mother    Anxiety disorder Mother    Obesity Mother    Depression Father    Hyperlipidemia Father    Hypertension Father    Sleep apnea Father    Allergies  Allergen Reactions   Wasp Venom Anaphylaxis   Codeine     GI issues   Sulfa Antibiotics Nausea And Vomiting   Sulfa Drugs Cross Reactors    Current Outpatient Medications  Medication Sig Dispense Refill   buPROPion (WELLBUTRIN SR) 200 MG 12 hr tablet Take 1 tablet (200 mg total) by mouth 2 (two) times daily. 60 tablet 0   citalopram (CELEXA) 20 MG tablet Take 30 mg by mouth at bedtime.  clobetasol ointment (TEMOVATE) 0.05 % Apply 1 Application topically 2 (two) times daily.     EPINEPHrine 0.3 mg/0.3 mL IJ SOAJ injection Inject 0.3 mg into the muscle as needed for anaphylaxis.     hydrochlorothiazide (HYDRODIURIL) 25 MG tablet Take 25 mg by mouth daily.     loratadine (CLARITIN) 10 MG tablet Take 10 mg by mouth daily.     losartan (COZAAR) 100 MG tablet TAKE 1 TABLET BY MOUTH ONCE DAILY 15 tablet 0   Multiple Vitamin (MULTIVITAMIN) tablet Take 1 tablet by mouth daily.     mupirocin ointment (BACTROBAN) 2 % Apply 1 Application topically daily.     ondansetron (ZOFRAN ODT) 8 MG disintegrating tablet Take 1 tablet (8 mg total) by mouth every 8 (eight) hours as needed for nausea or vomiting. 20 tablet 0   tacrolimus (PROTOPIC) 0.1 % ointment Apply 1 Application topically 2 (two) times daily.     Vitamin D, Ergocalciferol, (DRISDOL) 1.25 MG (50000 UNIT) CAPS capsule Take 1 capsule (50,000 Units total) by mouth every 7 (seven) days. 4 capsule 0   No current  facility-administered medications for this visit.   No results found.  Review of Systems:   A ROS was performed including pertinent positives and negatives as documented in the HPI.   Musculoskeletal Exam:      Left knee incisions are healed.  There is some scar around the tibial incision.  Range of motion is from 0 to 135.  No joint line tenderness.  Swelling improved.  Distal neurosensory exam intact  Imaging:      I personally reviewed and interpreted the radiographs.   Assessment:   19-month status post left medial meniscal root repair.  She is still having some tightness about the gastroc and the hamstring.  This fact I do believe she would benefit from some dry needling which I would encourage at her next session.  I did discuss that she may ultimately benefit from aquatic therapy as well to help loosen up the knee.  I did describe that repairs can take a more significant period of time for recovery.  I did describe that she is certainly on the normal recovery curve associated with this Plan :    -Return to clinic in 12 weeks for reassessment      I personally saw and evaluated the patient, and participated in the management and treatment plan.  Huel Cote, MD Attending Physician, Orthopedic Surgery  This document was dictated using Dragon voice recognition software. A reasonable attempt at proof reading has been made to minimize errors.

## 2022-09-26 ENCOUNTER — Ambulatory Visit (HOSPITAL_BASED_OUTPATIENT_CLINIC_OR_DEPARTMENT_OTHER): Payer: BC Managed Care – PPO | Admitting: Physical Therapy

## 2022-09-26 ENCOUNTER — Encounter (HOSPITAL_BASED_OUTPATIENT_CLINIC_OR_DEPARTMENT_OTHER): Payer: Self-pay

## 2022-09-28 ENCOUNTER — Encounter (INDEPENDENT_AMBULATORY_CARE_PROVIDER_SITE_OTHER): Payer: Self-pay | Admitting: Family Medicine

## 2022-09-28 ENCOUNTER — Other Ambulatory Visit: Payer: Self-pay

## 2022-09-28 ENCOUNTER — Ambulatory Visit (INDEPENDENT_AMBULATORY_CARE_PROVIDER_SITE_OTHER): Payer: BC Managed Care – PPO | Admitting: Family Medicine

## 2022-09-28 ENCOUNTER — Other Ambulatory Visit (HOSPITAL_BASED_OUTPATIENT_CLINIC_OR_DEPARTMENT_OTHER): Payer: Self-pay

## 2022-09-28 VITALS — BP 126/84 | HR 91 | Temp 98.2°F | Ht 62.0 in | Wt 287.0 lb

## 2022-09-28 DIAGNOSIS — E669 Obesity, unspecified: Secondary | ICD-10-CM

## 2022-09-28 DIAGNOSIS — R632 Polyphagia: Secondary | ICD-10-CM | POA: Diagnosis not present

## 2022-09-28 DIAGNOSIS — E559 Vitamin D deficiency, unspecified: Secondary | ICD-10-CM

## 2022-09-28 DIAGNOSIS — F39 Unspecified mood [affective] disorder: Secondary | ICD-10-CM

## 2022-09-28 DIAGNOSIS — Z6841 Body Mass Index (BMI) 40.0 and over, adult: Secondary | ICD-10-CM

## 2022-09-28 MED ORDER — VITAMIN D (ERGOCALCIFEROL) 1.25 MG (50000 UNIT) PO CAPS
50000.0000 [IU] | ORAL_CAPSULE | ORAL | 0 refills | Status: DC
Start: 2022-09-28 — End: 2022-10-12
  Filled 2022-09-28: qty 4, 28d supply, fill #0

## 2022-09-28 MED ORDER — BUPROPION HCL ER (SR) 200 MG PO TB12
200.0000 mg | ORAL_TABLET | Freq: Two times a day (BID) | ORAL | 0 refills | Status: DC
Start: 2022-09-28 — End: 2022-10-12
  Filled 2022-09-28: qty 60, 30d supply, fill #0

## 2022-09-28 MED ORDER — ZEPBOUND 5 MG/0.5ML ~~LOC~~ SOAJ
5.0000 mg | SUBCUTANEOUS | 0 refills | Status: DC
Start: 2022-09-28 — End: 2022-10-26
  Filled 2022-09-28: qty 2, 28d supply, fill #0

## 2022-10-02 NOTE — Progress Notes (Unsigned)
Chief Complaint:   OBESITY Tara Kelley is here to discuss her progress with her obesity treatment plan along with follow-up of her obesity related diagnoses. [name] is on keeping a food journal and adhering to recommended goals of 1500-1700 calories and 100+ grams of protein and states she is following her eating plan approximately 50% of the time. [name] states she is walking for 30 minutes 3 times per week.  Today's visit was #: 25 Starting weight: 311 lbs Starting date: 06/15/2021 Today's weight: 287 lbs Today's date: 09/28/2022 Total lbs lost to date: 24 Total lbs lost since last in-office visit: 1  Interim History: Patient continues to do well with her diet and weight loss.  She had extra challenges with having company and increased eating out, but she also was more active.  Subjective:   1. Polyphagia Patient is doing well on Zepbound, but she notes polyphagia has started to increase.  2. Vitamin D deficiency Patient's vitamin D level was at goal on vitamin D prescription.  She denies nausea, vomiting, or muscle weakness.  3. Emotional Eating Behavior Patient is doing well on her medications, and she continues to work on decreasing emotional eating behavior.  No side effects were noted.  Assessment/Plan:   1. Polyphagia Patient agreed to increase Zepbound to 5 mg once weekly, and we will refill for 1 month.  - tirzepatide (ZEPBOUND) 5 MG/0.5ML Pen; Inject 5 mg into the skin once a week.  Dispense: 2 mL; Refill: 0  2. Vitamin D deficiency We will refill prescription vitamin D for 1 month, and we will recheck labs at her next visit.  - Vitamin D, Ergocalciferol, (DRISDOL) 1.25 MG (50000 UNIT) CAPS capsule; Take 1 capsule (50,000 Units total) by mouth every 7 (seven) days.  Dispense: 4 capsule; Refill: 0  3. Emotional Eating Behavior Patient will continue Wellbutrin SR 200 mg twice daily, and we will refill for 1 month.  - buPROPion (WELLBUTRIN SR) 200 MG 12 hr tablet;  Take 1 tablet (200 mg total) by mouth 2 (two) times daily.  Dispense: 60 tablet; Refill: 0  4. BMI 50.0-59.9, adult (HCC)  5. Obesity,with starting BMI 56.88 Tara Kelley is currently in the action stage of change. As such, her goal is to continue with weight loss efforts. She has agreed to keeping a food journal and adhering to recommended goals of 1500-1700 calories and 100+ grams of protein daily.   Exercise goals: As is.   Behavioral modification strategies: increasing lean protein intake and meal planning and cooking strategies.  Tara Kelley has agreed to follow-up with our clinic in 3 to 4 weeks. She was informed of the importance of frequent follow-up visits to maximize her success with intensive lifestyle modifications for her multiple health conditions.   Objective:   Blood pressure 126/84, pulse 91, temperature 98.2 F (36.8 C), height 5\' 2"  (1.575 m), weight 287 lb (130.2 kg), SpO2 99 %. Body mass index is 52.49 kg/m.  Lab Results  Component Value Date   CREATININE 0.92 04/20/2022   BUN 9 04/20/2022   NA 136 04/20/2022   K 3.9 04/20/2022   CL 104 04/20/2022   CO2 26 04/20/2022   Lab Results  Component Value Date   ALT 21 02/20/2022   AST 22 02/20/2022   ALKPHOS 92 02/20/2022   BILITOT 0.4 02/20/2022   Lab Results  Component Value Date   HGBA1C 5.4 02/20/2022   HGBA1C 5.4 12/21/2021   HGBA1C 5.4 06/15/2021   Lab Results  Component Value  Date   INSULIN 12.2 02/20/2022   INSULIN 21.5 12/21/2021   INSULIN 20.6 06/15/2021   Lab Results  Component Value Date   TSH 1.610 02/20/2022   Lab Results  Component Value Date   CHOL 216 (H) 02/20/2022   HDL 44 02/20/2022   LDLCALC 146 (H) 02/20/2022   TRIG 145 02/20/2022   CHOLHDL 4.4 06/15/2021   Lab Results  Component Value Date   VD25OH 39.7 02/20/2022   VD25OH 42.3 12/21/2021   VD25OH 18.9 (L) 06/15/2021   Lab Results  Component Value Date   WBC 9.3 04/20/2022   HGB 14.5 04/20/2022   HCT 41.8 04/20/2022    MCV 89.3 04/20/2022   PLT 326 04/20/2022   No results found for: "IRON", "TIBC", "FERRITIN"  Attestation Statements:   Reviewed by clinician on day of visit: allergies, medications, problem list, medical history, surgical history, family history, social history, and previous encounter notes.   I, Burt Knack, am acting as transcriptionist for Quillian Quince, MD.  I have reviewed the above documentation for accuracy and completeness, and I agree with the above. -  Quillian Quince, MD

## 2022-10-12 ENCOUNTER — Ambulatory Visit (HOSPITAL_BASED_OUTPATIENT_CLINIC_OR_DEPARTMENT_OTHER): Payer: BC Managed Care – PPO | Attending: Orthopaedic Surgery | Admitting: Physical Therapy

## 2022-10-12 ENCOUNTER — Encounter (HOSPITAL_BASED_OUTPATIENT_CLINIC_OR_DEPARTMENT_OTHER): Payer: Self-pay | Admitting: Physical Therapy

## 2022-10-12 ENCOUNTER — Ambulatory Visit (INDEPENDENT_AMBULATORY_CARE_PROVIDER_SITE_OTHER): Payer: BC Managed Care – PPO | Admitting: Family Medicine

## 2022-10-12 ENCOUNTER — Encounter (INDEPENDENT_AMBULATORY_CARE_PROVIDER_SITE_OTHER): Payer: Self-pay | Admitting: Family Medicine

## 2022-10-12 ENCOUNTER — Other Ambulatory Visit (HOSPITAL_BASED_OUTPATIENT_CLINIC_OR_DEPARTMENT_OTHER): Payer: Self-pay

## 2022-10-12 VITALS — BP 120/76 | HR 89 | Temp 97.5°F | Ht 62.0 in | Wt 284.0 lb

## 2022-10-12 DIAGNOSIS — F39 Unspecified mood [affective] disorder: Secondary | ICD-10-CM

## 2022-10-12 DIAGNOSIS — E669 Obesity, unspecified: Secondary | ICD-10-CM | POA: Diagnosis not present

## 2022-10-12 DIAGNOSIS — E88819 Insulin resistance, unspecified: Secondary | ICD-10-CM | POA: Diagnosis not present

## 2022-10-12 DIAGNOSIS — M6281 Muscle weakness (generalized): Secondary | ICD-10-CM | POA: Insufficient documentation

## 2022-10-12 DIAGNOSIS — Z6841 Body Mass Index (BMI) 40.0 and over, adult: Secondary | ICD-10-CM

## 2022-10-12 DIAGNOSIS — R262 Difficulty in walking, not elsewhere classified: Secondary | ICD-10-CM | POA: Diagnosis not present

## 2022-10-12 DIAGNOSIS — M25562 Pain in left knee: Secondary | ICD-10-CM | POA: Diagnosis not present

## 2022-10-12 DIAGNOSIS — E559 Vitamin D deficiency, unspecified: Secondary | ICD-10-CM | POA: Diagnosis not present

## 2022-10-12 MED ORDER — VITAMIN D (ERGOCALCIFEROL) 1.25 MG (50000 UNIT) PO CAPS
50000.0000 [IU] | ORAL_CAPSULE | ORAL | 0 refills | Status: DC
Start: 2022-10-12 — End: 2022-10-26
  Filled 2022-10-12: qty 4, 28d supply, fill #0

## 2022-10-12 MED ORDER — BUPROPION HCL ER (SR) 200 MG PO TB12
200.0000 mg | ORAL_TABLET | Freq: Two times a day (BID) | ORAL | 0 refills | Status: DC
Start: 2022-10-12 — End: 2022-11-22
  Filled 2022-10-12: qty 60, 30d supply, fill #0

## 2022-10-12 MED ORDER — ZEPBOUND 2.5 MG/0.5ML ~~LOC~~ SOAJ
2.5000 mg | SUBCUTANEOUS | 0 refills | Status: DC
Start: 2022-10-12 — End: 2023-01-10
  Filled 2022-10-12: qty 2, 28d supply, fill #0
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
## 2022-10-12 NOTE — Progress Notes (Signed)
.[facility]  Office: [phone]  /  Fax: [phone]  WEIGHT SUMMARY AND BIOMETRICS  Anthropometric Measurements Height: 5\' 2"  (1.575 m) Weight: 284 lb (128.8 kg) BMI (Calculated): 51.93 Weight at Last Visit: 287 lb Weight Lost Since Last Visit: 3 lb Weight Gained Since Last Visit: 0   Body Composition  Body Fat %: 53.5 % Fat Mass (lbs): 152.2 lbs Muscle Mass (lbs): 125.6 lbs Total Body Water (lbs): 97.4 lbs Visceral Fat Rating : 19   Other Clinical Data Fasting: Yes Labs: Yes Today's Visit #: 26    Chief Complaint: OBESITY  Tara Kelley is here to discuss her progress with her obesity treatment plan. She is on the keeping a food journal and adhering to recommended goals of 1500-1700 calories and 100 gms protein and states she is following her eating plan approximately 70 % of the time. She states she is exercising 45-60 minutes 3 times per week.  Discussed the use of AI scribe software for clinical note transcription with the patient, who gave verbal consent to proceed.  History of Present Illness   The patient, currently on a regimen of Zetbound, reports experiencing gastrointestinal (GI) issues following the second dose of an increased amount of the medication. The patient describes the symptoms as a nonstop queasiness, severe enough to warrant the use of Zofran for relief. The patient also reports extreme fatigue, necessitating frequent naps on the day following the dose. Despite these side effects, the patient perceives the benefits of the medication to outweigh the discomfort, noting a significant reduction in food cravings and a weight loss of three pounds.  The patient expresses concerns about maintaining adequate protein intake on days when they do not feel like eating due to the medication's side effects. They have been considering convenience options and pre-chopped ingredients to facilitate meal preparation. The patient also notes a decreased interest in cooking, which they  attribute to the effects of the medication and possibly the summer heat.  In addition to the Zetbound, the patient is also on Wellbutrin for emotional eating behaviors. They report a significant improvement in these behaviors, noting a shift towards healthier food choices and portion control. The patient also mentions a desire for their partner to join them in their health journey, as the partner has recently been diagnosed with fatty liver disease.  The patient is also on a vitamin D supplement due to a previous deficiency. They report increased sun exposure due to outdoor activities, such as gardening. The patient is due for a follow-up lab test to monitor their vitamin D levels, kidney function, liver function, A1C, insulin for insulin resistance, and cholesterol.          PHYSICAL EXAM:  Blood pressure 120/76, pulse 89, temperature (!) 97.5 F (36.4 C), height 5\' 2"  (1.575 m), weight 284 lb (128.8 kg), SpO2 96 %. Body mass index is 51.94 kg/m.  DIAGNOSTIC DATA REVIEWED:  BMET    Component Value Date/Time   NA 136 04/20/2022 0901   NA 139 02/20/2022 0742   K 3.9 04/20/2022 0901   CL 104 04/20/2022 0901   CO2 26 04/20/2022 0901   GLUCOSE 81 04/20/2022 0901   BUN 9 04/20/2022 0901   BUN 10 02/20/2022 0742   CREATININE 0.92 04/20/2022 0901   CALCIUM 8.9 04/20/2022 0901   GFRNONAA >60 04/20/2022 0901   GFRAA >90 07/20/2014 2128   Lab Results  Component Value Date   HGBA1C 5.4 02/20/2022   HGBA1C 5.4 06/15/2021   Lab Results  Component Value  Date   INSULIN 12.2 02/20/2022   INSULIN 20.6 06/15/2021   Lab Results  Component Value Date   TSH 1.610 02/20/2022   CBC    Component Value Date/Time   WBC 9.3 04/20/2022 0901   RBC 4.68 04/20/2022 0901   HGB 14.5 04/20/2022 0901   HGB 14.3 06/15/2021 1036   HCT 41.8 04/20/2022 0901   HCT 43.7 06/15/2021 1036   PLT 326 04/20/2022 0901   PLT 332 06/15/2021 1036   MCV 89.3 04/20/2022 0901   MCV 92 06/15/2021 1036   MCH  31.0 04/20/2022 0901   MCHC 34.7 04/20/2022 0901   RDW 12.3 04/20/2022 0901   RDW 12.6 06/15/2021 1036   Iron Studies No results found for: "IRON", "TIBC", "FERRITIN", "IRONPCTSAT" Lipid Panel     Component Value Date/Time   CHOL 216 (H) 02/20/2022 0742   TRIG 145 02/20/2022 0742   HDL 44 02/20/2022 0742   CHOLHDL 4.4 06/15/2021 1036   LDLCALC 146 (H) 02/20/2022 0742   Hepatic Function Panel     Component Value Date/Time   PROT 6.3 02/20/2022 0742   ALBUMIN 3.9 02/20/2022 0742   AST 22 02/20/2022 0742   ALT 21 02/20/2022 0742   ALKPHOS 92 02/20/2022 0742   BILITOT 0.4 02/20/2022 0742      Component Value Date/Time   TSH 1.610 02/20/2022 0742   Nutritional Lab Results  Component Value Date   VD25OH 39.7 02/20/2022   VD25OH 42.3 12/21/2021   VD25OH 18.9 (L) 06/15/2021     Assessment and Plan    Obesity : Patient is on Zetbound, but experiencing GI side effects and fatigue after increasing the dose to 5mg . Discussed the benefits of consistent dosing and the potential for side effects to subside after a few doses. Also discussed the importance of maintaining a balanced diet, particularly protein intake, even when experiencing nausea.   Insulin Resistance:  -Reduce Zetbound dose to 2.5mg  and alternate with 5mg  to ease into higher dose. -Encouraged to consume cold protein drinks or other protein-rich foods when feeling nauseous. -check labs today   EEB -Continue Wellbutrin for emotional eating behaviors.   Vitamin D Deficiency: Last level was 39, close to the target range of 50.  -Continue current Vitamin D supplementation. -Check Vitamin D level today (10/12/2022) to assess response to supplementation.  General Health Maintenance: -Check kidney function, liver function, A1C, insulin, cholesterol, and Vitamin D levels today (10/12/2022). -Schedule next two follow-up appointments.         I have personally spent 40 minutes total time today in preparation,  patient care, and documentation for this visit, including the following: review of clinical lab tests; review of medical tests/procedures/services.    She was informed of the importance of frequent follow up visits to maximize her success with intensive lifestyle modifications for her multiple health conditions. Return in about 4 weeks (around 11/09/2022).   Quillian Quince, MD

## 2022-10-13 LAB — LIPID PANEL WITH LDL/HDL RATIO
Cholesterol, Total: 207 mg/dL — ABNORMAL HIGH (ref 100–199)
HDL: 48 mg/dL (ref >39)
LDL Chol Calc (NIH): 139 mg/dL — ABNORMAL HIGH (ref 0–99)
LDL/HDL Ratio: 2.9 ratio (ref 0.0–3.2)
Triglycerides: 110 mg/dL (ref 0–149)
VLDL Cholesterol Cal: 20 mg/dL (ref 5–40)

## 2022-10-13 LAB — CMP14+EGFR
ALT: 17 IU/L (ref 0–32)
AST: 18 IU/L (ref 0–40)
Albumin/Globulin Ratio: 1.6
Albumin: 4.1 g/dL (ref 3.9–4.9)
Alkaline Phosphatase: 116 IU/L (ref 44–121)
BUN/Creatinine Ratio: 10 (ref 9–23)
BUN: 10 mg/dL (ref 6–24)
Bilirubin Total: 0.3 mg/dL (ref 0.0–1.2)
CO2: 23 mmol/L (ref 20–29)
Calcium: 9.3 mg/dL (ref 8.7–10.2)
Chloride: 101 mmol/L (ref 96–106)
Creatinine, Ser: 0.97 mg/dL (ref 0.57–1.00)
Globulin, Total: 2.6 g/dL (ref 1.5–4.5)
Glucose: 79 mg/dL (ref 70–99)
Potassium: 4.3 mmol/L (ref 3.5–5.2)
Sodium: 140 mmol/L (ref 134–144)
Total Protein: 6.7 g/dL (ref 6.0–8.5)
eGFR: 75 mL/min/{1.73_m2} (ref >59)

## 2022-10-13 LAB — HEMOGLOBIN A1C
Est. average glucose Bld gHb Est-mCnc: 103 mg/dL
Hgb A1c MFr Bld: 5.2 % (ref 4.8–5.6)

## 2022-10-13 LAB — VITAMIN D 25 HYDROXY (VIT D DEFICIENCY, FRACTURES): Vit D, 25-Hydroxy: 42.1 ng/mL (ref 30.0–100.0)

## 2022-10-13 LAB — INSULIN, RANDOM: INSULIN: 20.3 u[IU]/mL (ref 2.6–24.9)

## 2022-10-13 LAB — VITAMIN B12: Vitamin B-12: 569 pg/mL (ref 232–1245)

## 2022-10-26 ENCOUNTER — Other Ambulatory Visit (HOSPITAL_BASED_OUTPATIENT_CLINIC_OR_DEPARTMENT_OTHER): Payer: Self-pay

## 2022-10-26 ENCOUNTER — Encounter (HOSPITAL_BASED_OUTPATIENT_CLINIC_OR_DEPARTMENT_OTHER): Payer: BC Managed Care – PPO | Admitting: Physical Therapy

## 2022-10-26 ENCOUNTER — Ambulatory Visit (INDEPENDENT_AMBULATORY_CARE_PROVIDER_SITE_OTHER): Payer: BC Managed Care – PPO | Admitting: Family Medicine

## 2022-10-26 ENCOUNTER — Other Ambulatory Visit: Payer: Self-pay

## 2022-10-26 ENCOUNTER — Encounter (INDEPENDENT_AMBULATORY_CARE_PROVIDER_SITE_OTHER): Payer: Self-pay | Admitting: Family Medicine

## 2022-10-26 VITALS — BP 123/84 | HR 82 | Temp 98.2°F | Ht 62.0 in | Wt 282.0 lb

## 2022-10-26 DIAGNOSIS — E669 Obesity, unspecified: Secondary | ICD-10-CM

## 2022-10-26 DIAGNOSIS — E782 Mixed hyperlipidemia: Secondary | ICD-10-CM

## 2022-10-26 DIAGNOSIS — R632 Polyphagia: Secondary | ICD-10-CM | POA: Diagnosis not present

## 2022-10-26 DIAGNOSIS — E559 Vitamin D deficiency, unspecified: Secondary | ICD-10-CM | POA: Diagnosis not present

## 2022-10-26 DIAGNOSIS — E785 Hyperlipidemia, unspecified: Secondary | ICD-10-CM

## 2022-10-26 DIAGNOSIS — E88819 Insulin resistance, unspecified: Secondary | ICD-10-CM

## 2022-10-26 DIAGNOSIS — Z6841 Body Mass Index (BMI) 40.0 and over, adult: Secondary | ICD-10-CM

## 2022-10-26 MED ORDER — VITAMIN D (ERGOCALCIFEROL) 1.25 MG (50000 UNIT) PO CAPS
50000.0000 [IU] | ORAL_CAPSULE | ORAL | 0 refills | Status: DC
Start: 2022-10-26 — End: 2022-11-22
  Filled 2022-10-26 – 2022-11-06 (×2): qty 4, 28d supply, fill #0

## 2022-10-26 MED ORDER — ZEPBOUND 5 MG/0.5ML ~~LOC~~ SOAJ
5.0000 mg | SUBCUTANEOUS | 0 refills | Status: DC
Start: 2022-10-26 — End: 2022-11-22
  Filled 2022-10-26: qty 2, 28d supply, fill #0

## 2022-10-26 NOTE — Progress Notes (Signed)
.[facility]  Office: [phone]  /  Fax: [phone]  WEIGHT SUMMARY AND BIOMETRICS  Anthropometric Measurements Height: 5\' 2"  (1.575 m) Weight: 282 lb (127.9 kg) BMI (Calculated): 51.57 Weight at Last Visit: 284 lb Weight Lost Since Last Visit: 2 lb Weight Gained Since Last Visit: 0 Starting Weight: 311 lb Total Weight Loss (lbs): 29 lb (13.2 kg) Peak Weight: 311 lb   Body Composition  Body Fat %: 52.4 % Fat Mass (lbs): 148.2 lbs Muscle Mass (lbs): 127.8 lbs Total Body Water (lbs): 95.8 lbs Visceral Fat Rating : 19   Other Clinical Data Fasting: yes Labs: no Today's Visit #: 27 Starting Date: 06/15/21    Chief Complaint: OBESITY   Discussed the use of AI scribe software for clinical note transcription with the patient, who gave verbal consent to proceed.  History of Present Illness   Tara Kelley, a 42 year old patient with a history of obesity and polyphasia, presents for a follow-up visit. Over the past two weeks, the patient has lost two pounds and has been adhering to a diet plan of 1500-1700 calories per day with a focus on protein intake. The patient reports engaging in strengthening exercises approximately 60% of the time, for 45 minutes, three times per week.  The patient has been on alternating doses of Zepbound, 2.5mg  and 5mg , which has reportedly improved her overall well-being. The patient reports a significant reduction in hunger and cravings, and has been more mindful about food choices, considering the physical effects of food rather than emotional responses.  The patient acknowledges room for improvement in her diet, particularly in protein intake. They have been keeping a food journal, even during periods of dietary lapses, and are working on identifying suitable food options for different situations.  The patient has also been taking vitamin D supplements, with recent lab results showing an improvement in levels, nearing the target. The patient's hemoglobin A1c  levels are within the normal range, indicating good glucose control. However, fasting insulin levels have increased, suggesting the patient's pancreas may be working harder to maintain glucose control. The patient's LDL cholesterol levels are slightly elevated, which may be due to diet or genetics.  The patient has been managing her health conditions with a combination of medication, diet, and exercise. They have been proactive in their health management, showing commitment to their diet plan, exercise regimen, and medication schedule. The patient's efforts have resulted in weight loss and improved health markers, although there is still room for improvement.          PHYSICAL EXAM:  Blood pressure 123/84, pulse 82, temperature 98.2 F (36.8 C), height 5\' 2"  (1.575 m), weight 282 lb (127.9 kg), SpO2 100 %. Body mass index is 51.58 kg/m.  DIAGNOSTIC DATA REVIEWED:  BMET    Component Value Date/Time   NA 140 10/12/2022 0828   K 4.3 10/12/2022 0828   CL 101 10/12/2022 0828   CO2 23 10/12/2022 0828   GLUCOSE 79 10/12/2022 0828   GLUCOSE 81 04/20/2022 0901   BUN 10 10/12/2022 0828   CREATININE 0.97 10/12/2022 0828   CALCIUM 9.3 10/12/2022 0828   GFRNONAA >60 04/20/2022 0901   GFRAA >90 07/20/2014 2128   Lab Results  Component Value Date   HGBA1C 5.2 10/12/2022   HGBA1C 5.4 06/15/2021   Lab Results  Component Value Date   INSULIN 20.3 10/12/2022   INSULIN 20.6 06/15/2021   Lab Results  Component Value Date   TSH 1.610 02/20/2022   CBC    Component Value  Date/Time   WBC 9.3 04/20/2022 0901   RBC 4.68 04/20/2022 0901   HGB 14.5 04/20/2022 0901   HGB 14.3 06/15/2021 1036   HCT 41.8 04/20/2022 0901   HCT 43.7 06/15/2021 1036   PLT 326 04/20/2022 0901   PLT 332 06/15/2021 1036   MCV 89.3 04/20/2022 0901   MCV 92 06/15/2021 1036   MCH 31.0 04/20/2022 0901   MCHC 34.7 04/20/2022 0901   RDW 12.3 04/20/2022 0901   RDW 12.6 06/15/2021 1036   Iron Studies No results  found for: "IRON", "TIBC", "FERRITIN", "IRONPCTSAT" Lipid Panel     Component Value Date/Time   CHOL 207 (H) 10/12/2022 0828   TRIG 110 10/12/2022 0828   HDL 48 10/12/2022 0828   CHOLHDL 4.4 06/15/2021 1036   LDLCALC 139 (H) 10/12/2022 0828   Hepatic Function Panel     Component Value Date/Time   PROT 6.7 10/12/2022 0828   ALBUMIN 4.1 10/12/2022 0828   AST 18 10/12/2022 0828   ALT 17 10/12/2022 0828   ALKPHOS 116 10/12/2022 0828   BILITOT 0.3 10/12/2022 0828      Component Value Date/Time   TSH 1.610 02/20/2022 0742   Nutritional Lab Results  Component Value Date   VD25OH 42.1 10/12/2022   VD25OH 39.7 02/20/2022   VD25OH 42.3 12/21/2021     Assessment and Plan    Obesity and Polyphagia: Patient has lost 2 pounds in the last two weeks. She is following a food journaling plan with 1500-1700 calories and 100 grams of protein daily. She is also doing strengthening exercises 60% of the time for 45 minutes, three times a week. She is on alternating doses of Zepbound (2.5mg  and 5mg ) which has improved her hunger control and reduced cravings. -Continue with the current diet and exercise regimen. -Continue alternating doses of Zepbound (2.5mg  and 5mg ). -Refill Zepbound 5mg  prescription.  Hyperlipidemia: LDL cholesterol is 139, which is above the goal of less than 100. Discussed the impact of diet and exercise on cholesterol levels. -Continue with the current diet and exercise regimen. -Recheck lipid panel at the next visit.  Vitamin D Deficiency: Current level is 42, with a goal of 50. Patient is on Vitamin D supplementation. -Continue Vitamin D supplementation. -Recheck Vitamin D level in the fall.  Insulin Resistance: Fasting insulin level is 20.3, which is above the goal of less than 5. This indicates that the pancreas is working too hard to maintain blood sugar control. Discussed the impact of diet and exercise on insulin levels. -Continue with the current diet and  exercise regimen. -Recheck fasting insulin level at the next visit.  Follow-up in 4 weeks.         I have personally spent 40 minutes total time today in preparation, patient care, and documentation for this visit, including the following: review of clinical lab tests; review of medical tests/procedures/services.    She was informed of the importance of frequent follow up visits to maximize her success with intensive lifestyle modifications for her multiple health conditions.    Quillian Quince, MD

## 2022-11-06 ENCOUNTER — Other Ambulatory Visit: Payer: Self-pay

## 2022-11-06 ENCOUNTER — Other Ambulatory Visit (INDEPENDENT_AMBULATORY_CARE_PROVIDER_SITE_OTHER): Payer: Self-pay | Admitting: Family Medicine

## 2022-11-06 ENCOUNTER — Other Ambulatory Visit (HOSPITAL_BASED_OUTPATIENT_CLINIC_OR_DEPARTMENT_OTHER): Payer: Self-pay

## 2022-11-06 DIAGNOSIS — F39 Unspecified mood [affective] disorder: Secondary | ICD-10-CM

## 2022-11-06 DIAGNOSIS — R632 Polyphagia: Secondary | ICD-10-CM

## 2022-11-08 ENCOUNTER — Other Ambulatory Visit (HOSPITAL_BASED_OUTPATIENT_CLINIC_OR_DEPARTMENT_OTHER): Payer: Self-pay

## 2022-11-09 ENCOUNTER — Ambulatory Visit (HOSPITAL_BASED_OUTPATIENT_CLINIC_OR_DEPARTMENT_OTHER): Payer: BC Managed Care – PPO | Attending: Orthopaedic Surgery | Admitting: Physical Therapy

## 2022-11-09 ENCOUNTER — Other Ambulatory Visit (HOSPITAL_BASED_OUTPATIENT_CLINIC_OR_DEPARTMENT_OTHER): Payer: Self-pay

## 2022-11-09 ENCOUNTER — Encounter (HOSPITAL_BASED_OUTPATIENT_CLINIC_OR_DEPARTMENT_OTHER): Payer: Self-pay | Admitting: Physical Therapy

## 2022-11-09 DIAGNOSIS — R262 Difficulty in walking, not elsewhere classified: Secondary | ICD-10-CM | POA: Insufficient documentation

## 2022-11-09 DIAGNOSIS — M25562 Pain in left knee: Secondary | ICD-10-CM | POA: Diagnosis not present

## 2022-11-09 DIAGNOSIS — M6281 Muscle weakness (generalized): Secondary | ICD-10-CM | POA: Insufficient documentation
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
## 2022-11-22 ENCOUNTER — Encounter (INDEPENDENT_AMBULATORY_CARE_PROVIDER_SITE_OTHER): Payer: Self-pay | Admitting: Family Medicine

## 2022-11-22 ENCOUNTER — Other Ambulatory Visit: Payer: Self-pay

## 2022-11-22 ENCOUNTER — Ambulatory Visit (INDEPENDENT_AMBULATORY_CARE_PROVIDER_SITE_OTHER): Payer: BC Managed Care – PPO | Admitting: Family Medicine

## 2022-11-22 ENCOUNTER — Other Ambulatory Visit (HOSPITAL_BASED_OUTPATIENT_CLINIC_OR_DEPARTMENT_OTHER): Payer: Self-pay

## 2022-11-22 VITALS — BP 120/79 | HR 89 | Temp 97.6°F | Ht 62.0 in | Wt 283.0 lb

## 2022-11-22 DIAGNOSIS — E559 Vitamin D deficiency, unspecified: Secondary | ICD-10-CM

## 2022-11-22 DIAGNOSIS — F39 Unspecified mood [affective] disorder: Secondary | ICD-10-CM

## 2022-11-22 DIAGNOSIS — E669 Obesity, unspecified: Secondary | ICD-10-CM

## 2022-11-22 DIAGNOSIS — R632 Polyphagia: Secondary | ICD-10-CM | POA: Diagnosis not present

## 2022-11-22 DIAGNOSIS — Z6841 Body Mass Index (BMI) 40.0 and over, adult: Secondary | ICD-10-CM

## 2022-11-22 MED ORDER — BUPROPION HCL ER (SR) 200 MG PO TB12
200.0000 mg | ORAL_TABLET | Freq: Two times a day (BID) | ORAL | 0 refills | Status: DC
Start: 2022-11-22 — End: 2022-12-20
  Filled 2022-11-22: qty 60, 30d supply, fill #0

## 2022-11-22 MED ORDER — ZEPBOUND 5 MG/0.5ML ~~LOC~~ SOAJ
5.0000 mg | SUBCUTANEOUS | 0 refills | Status: DC
Start: 2022-11-22 — End: 2023-01-10
  Filled 2022-11-22: qty 2, 28d supply, fill #0

## 2022-11-22 MED ORDER — VITAMIN D (ERGOCALCIFEROL) 1.25 MG (50000 UNIT) PO CAPS
50000.0000 [IU] | ORAL_CAPSULE | ORAL | 0 refills | Status: DC
Start: 2022-11-22 — End: 2022-12-20
  Filled 2022-11-22: qty 4, 28d supply, fill #0

## 2022-11-22 NOTE — Progress Notes (Signed)
.smr  Office: [phone]  /  Fax: [phone]  WEIGHT SUMMARY AND BIOMETRICS  Anthropometric Measurements Height: 5\' 2"  (1.575 m) Weight: 283 lb (128.4 kg) BMI (Calculated): 51.75 Weight at Last Visit: 282 lb Weight Lost Since Last Visit: 0 Weight Gained Since Last Visit: 1 lb Starting Weight: 311 lb Total Weight Loss (lbs): 28 lb (12.7 kg) Peak Weight: 311 lb   Body Composition  Body Fat %: 53 % Fat Mass (lbs): 150.4 lbs Muscle Mass (lbs): 126.4 lbs Total Body Water (lbs): 96.6 lbs Visceral Fat Rating : 19   Other Clinical Data Fasting: No Labs: No Today's Visit #: 28 Starting Date: 06/15/21    Chief Complaint: OBESITY     History of Present Illness   The patient is a 42 year old female who presents with concerns related to obesity, vitamin D deficiency, and depression with emotional eating behaviors. She reports a recent weight gain of one pound over the last month, despite attempts to maintain a food journal and adhere to a calorie goal of 1500-1700 calories per day, with at least 100 grams of protein. She admits to not currently engaging in any exercise.  The patient describes an increase in cravings and emotional eating behaviors, particularly during periods of stress. She recounts a recent episode where she consumed two pieces of cake during a period of emotional distress. She also expresses concerns about the effectiveness of her current medication, Zepbound, for managing her polyphagia, and is alternating between 2.5mg  and 5mg  doses.  The patient also reports recent fatigue upon waking, despite no changes in sleep quantity or quality. She mentions a recent stressful event involving a sick pet, but struggles to identify other specific stressors. She also notes a recent dream about a deceased friend, which has impacted her emotional state.  Despite these challenges, the patient has been able to maintain some positive habits, such as meal planning and grocery  shopping. She expresses a desire to focus on managing her cravings and prioritizing protein in her diet. She is currently taking Wellbutrin and Celexa for depression and emotional eating behaviors, and a vitamin D supplement for a deficiency.          PHYSICAL EXAM:  Blood pressure 120/79, pulse 89, temperature 97.6 F (36.4 C), height 5\' 2"  (1.575 m), weight 283 lb (128.4 kg), SpO2 96%. Body mass index is 51.76 kg/m.  DIAGNOSTIC DATA REVIEWED:  BMET    Component Value Date/Time   NA 140 10/12/2022 0828   K 4.3 10/12/2022 0828   CL 101 10/12/2022 0828   CO2 23 10/12/2022 0828   GLUCOSE 79 10/12/2022 0828   GLUCOSE 81 04/20/2022 0901   BUN 10 10/12/2022 0828   CREATININE 0.97 10/12/2022 0828   CALCIUM 9.3 10/12/2022 0828   GFRNONAA >60 04/20/2022 0901   GFRAA >90 07/20/2014 2128   Lab Results  Component Value Date   HGBA1C 5.2 10/12/2022   HGBA1C 5.4 06/15/2021   Lab Results  Component Value Date   INSULIN 20.3 10/12/2022   INSULIN 20.6 06/15/2021   Lab Results  Component Value Date   TSH 1.610 02/20/2022   CBC    Component Value Date/Time   WBC 9.3 04/20/2022 0901   RBC 4.68 04/20/2022 0901   HGB 14.5 04/20/2022 0901   HGB 14.3 06/15/2021 1036   HCT 41.8 04/20/2022 0901   HCT 43.7 06/15/2021 1036   PLT 326 04/20/2022 0901   PLT 332 06/15/2021 1036   MCV 89.3 04/20/2022 0901   MCV 92 06/15/2021  1036   MCH 31.0 04/20/2022 0901   MCHC 34.7 04/20/2022 0901   RDW 12.3 04/20/2022 0901   RDW 12.6 06/15/2021 1036   Iron Studies No results found for: "IRON", "TIBC", "FERRITIN", "IRONPCTSAT" Lipid Panel     Component Value Date/Time   CHOL 207 (H) 10/12/2022 0828   TRIG 110 10/12/2022 0828   HDL 48 10/12/2022 0828   CHOLHDL 4.4 06/15/2021 1036   LDLCALC 139 (H) 10/12/2022 0828   Hepatic Function Panel     Component Value Date/Time   PROT 6.7 10/12/2022 0828   ALBUMIN 4.1 10/12/2022 0828   AST 18 10/12/2022 0828   ALT 17 10/12/2022 0828   ALKPHOS  116 10/12/2022 0828   BILITOT 0.3 10/12/2022 0828      Component Value Date/Time   TSH 1.610 02/20/2022 0742   Nutritional Lab Results  Component Value Date   VD25OH 42.1 10/12/2022   VD25OH 39.7 02/20/2022   VD25OH 42.3 12/21/2021     Assessment and Plan    Obesity with emotional eating: Increased cravings and emotional eating behaviors noted. Patient has been maintaining a food journal approximately 50% of the time with a calorie goal of 1500-1700 and at least 100g of protein per day. Gained 1 pound in the last month. Not currently exercising. -Continue Zepbound for polyphagia. -Encouraged to prioritize protein intake to manage cravings. -Continue meal planning and prepping. -Encouraged to maintain food journal and exercise.  EEB: Patient reports feeling that her medication is not working as well as it was. She has been alternating between 2.5mg  and 5mg  doses. Recent stressors include a sick pet. -Continue Wellbutrin and Celexa for depression with emotional eating behaviors.   Polyphagia: -Increase Zepbound to 5mg  for a full month and reassess at next visit.  Vitamin D deficiency: No issues reported with current treatment. -Continue Vitamin D supplementation.  Follow-up in September 2024.         She was informed of the importance of frequent follow up visits to maximize her success with intensive lifestyle modifications for her multiple health conditions.    Quillian Quince, MD

## 2022-12-13 ENCOUNTER — Encounter (HOSPITAL_BASED_OUTPATIENT_CLINIC_OR_DEPARTMENT_OTHER): Payer: Self-pay | Admitting: Orthopaedic Surgery

## 2022-12-13 ENCOUNTER — Ambulatory Visit (INDEPENDENT_AMBULATORY_CARE_PROVIDER_SITE_OTHER): Payer: BC Managed Care – PPO | Admitting: Orthopaedic Surgery

## 2022-12-13 DIAGNOSIS — M23322 Other meniscus derangements, posterior horn of medial meniscus, left knee: Secondary | ICD-10-CM | POA: Diagnosis not present

## 2022-12-13 NOTE — Progress Notes (Signed)
Post Operative Evaluation    Procedure/Date of Surgery: Left knee meniscal medial root repair 12/28  Interval History:   Presents with left knee follow-up for the above procedure.  Overall she is doing very well.  She has no pain in the knee.  She does have some tightness about the IT band although this is mild.  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   SOB (shortness of breath) on exertion    Past Surgical History:  Procedure Laterality Date   APPENDECTOMY  1998   cyst rupture  1998   Ovanrian cyst rupture   EYE SURGERY N/A 2015   lasik   KNEE ARTHROSCOPY WITH MENISCAL REPAIR Left 04/27/2022   Procedure: LEFT KNEE ARTHROSCOPY WITH MEDIAL MENISCAL REPAIR;  Surgeon: Huel Cote, MD;  Location: [facility] OR;  Service: Orthopedics;  Laterality: Left;   Social History   Socioeconomic History   Marital status: Significant Other    Spouse name: Not on file   Number of children: Not on file   Years of education: Not on file   Highest education level: Not on file  Occupational History   Not on file  Tobacco Use   Smoking status: Former    Current packs/day: 0.00    Types: Cigarettes    Quit date: 2001    Years since quitting: 23.6   Smokeless tobacco: Not on file  Vaping Use   Vaping status: Never Used  Substance and Sexual Activity   Alcohol use: Yes    Comment: rarely   Drug use: No   Sexual activity: Yes  Other Topics Concern   Not on file  Social History Narrative   Not on file   Social Determinants of Health   Financial  Resource Strain: Not on file  Food Insecurity: Not on file  Transportation Needs: Not on file  Physical Activity: Not on file  Stress: Not on file (06/10/2019)  Social Connections: Not on file   Family History  Problem Relation Age of Onset   Hyperlipidemia Mother    Hypertension Mother    Diabetes Mother    Depression Mother    Anxiety disorder Mother    Obesity Mother    Depression Father    Hyperlipidemia Father    Hypertension Father    Sleep apnea Father    Allergies  Allergen Reactions   Wasp Venom Anaphylaxis   Codeine     GI issues   Sulfa Antibiotics Nausea And Vomiting   Sulfa Drugs Cross Reactors    Current Outpatient Medications  Medication Sig Dispense Refill   buPROPion (WELLBUTRIN SR) 200 MG 12 hr tablet Take 1 tablet (200 mg total) by mouth 2 (two) times daily. 60 tablet 0   citalopram (CELEXA) 20 MG tablet Take 30 mg by mouth at bedtime.     clobetasol  ointment (TEMOVATE) 0.05 % Apply 1 Application topically 2 (two) times daily.     EPINEPHrine 0.3 mg/0.3 mL IJ SOAJ injection Inject 0.3 mg into the muscle as needed for anaphylaxis.     hydrochlorothiazide (HYDRODIURIL) 25 MG tablet Take 25 mg by mouth daily.     loratadine (CLARITIN) 10 MG tablet Take 10 mg by mouth daily.     losartan (COZAAR) 100 MG tablet TAKE 1 TABLET BY MOUTH ONCE DAILY 15 tablet 0   Multiple Vitamin (MULTIVITAMIN) tablet Take 1 tablet by mouth daily.     mupirocin ointment (BACTROBAN) 2 % Apply 1 Application topically daily.     ondansetron (ZOFRAN ODT) 8 MG disintegrating tablet Take 1 tablet (8 mg total) by mouth every 8 (eight) hours as needed for nausea or vomiting. 20 tablet 0   tacrolimus (PROTOPIC) 0.1 % ointment Apply 1 Application topically 2 (two) times daily.     tirzepatide (ZEPBOUND) 2.5 MG/0.5ML Pen Inject 2.5 mg into the skin once a week. 2 mL 0   tirzepatide (ZEPBOUND) 2.5 MG/0.5ML Pen Inject 2.5 mg into the skin once a week. 2 mL 0   tirzepatide (ZEPBOUND) 5 MG/0.5ML  Pen Inject 5 mg into the skin once a week. 2 mL 0   Vitamin D, Ergocalciferol, (DRISDOL) 1.25 MG (50000 UNIT) CAPS capsule Take 1 capsule (50,000 Units total) by mouth every 7 (seven) days. 4 capsule 0   No current facility-administered medications for this visit.   No results found.  Review of Systems:   A ROS was performed including pertinent positives and negatives as documented in the HPI.   Musculoskeletal Exam:      Left knee incisions are healed.  There is some scar around the tibial incision.  Range of motion is from 0 to 135.  No joint line tenderness.  Swelling improved.  Distal neurosensory exam intact  Imaging:      I personally reviewed and interpreted the radiographs.   Assessment:   36-month status post left medial meniscal root repair.  Overall she is doing extremely well at today's visit.  Just has some tightness in the IT band.  At this time I will plan to see her back as needed Plan :    -Return to clinic as needed      I personally saw and evaluated the patient, and participated in the management and treatment plan.  Huel Cote, MD Attending Physician, Orthopedic Surgery  This document was dictated using Dragon voice recognition software. A reasonable attempt at proof reading has been made to minimize errors.

## 2022-12-20 ENCOUNTER — Other Ambulatory Visit (HOSPITAL_BASED_OUTPATIENT_CLINIC_OR_DEPARTMENT_OTHER): Payer: Self-pay

## 2022-12-20 ENCOUNTER — Ambulatory Visit (INDEPENDENT_AMBULATORY_CARE_PROVIDER_SITE_OTHER): Payer: BC Managed Care – PPO | Admitting: Family Medicine

## 2022-12-20 ENCOUNTER — Encounter (INDEPENDENT_AMBULATORY_CARE_PROVIDER_SITE_OTHER): Payer: Self-pay | Admitting: Family Medicine

## 2022-12-20 VITALS — BP 120/80 | HR 76 | Temp 97.4°F | Ht 62.0 in | Wt 281.0 lb

## 2022-12-20 DIAGNOSIS — F39 Unspecified mood [affective] disorder: Secondary | ICD-10-CM

## 2022-12-20 DIAGNOSIS — F5089 Other specified eating disorder: Secondary | ICD-10-CM

## 2022-12-20 DIAGNOSIS — E669 Obesity, unspecified: Secondary | ICD-10-CM

## 2022-12-20 DIAGNOSIS — Z6841 Body Mass Index (BMI) 40.0 and over, adult: Secondary | ICD-10-CM | POA: Diagnosis not present

## 2022-12-20 DIAGNOSIS — E559 Vitamin D deficiency, unspecified: Secondary | ICD-10-CM

## 2022-12-20 MED ORDER — VITAMIN D (ERGOCALCIFEROL) 1.25 MG (50000 UNIT) PO CAPS
50000.0000 [IU] | ORAL_CAPSULE | ORAL | 0 refills | Status: DC
Start: 2022-12-20 — End: 2023-01-10
  Filled 2022-12-20: qty 4, 28d supply, fill #0

## 2022-12-20 MED ORDER — BUPROPION HCL ER (SR) 200 MG PO TB12
200.0000 mg | ORAL_TABLET | Freq: Two times a day (BID) | ORAL | 0 refills | Status: DC
Start: 2022-12-20 — End: 2023-01-10
  Filled 2022-12-20: qty 60, 30d supply, fill #0

## 2022-12-20 NOTE — Progress Notes (Signed)
.smr  Office: [phone]  /  Fax: [phone]  WEIGHT SUMMARY AND BIOMETRICS  Anthropometric Measurements Height: 5\' 2"  (1.575 m) Weight: 281 lb (127.5 kg) BMI (Calculated): 51.38 Weight at Last Visit: 283 lb Weight Lost Since Last Visit: 2 lb Weight Gained Since Last Visit: 0 Starting Weight: 311 lb Total Weight Loss (lbs): 30 lb (13.6 kg) Peak Weight: 311 lb   Body Composition  Body Fat %: 53.7 % Fat Mass (lbs): 151.2 lbs Muscle Mass (lbs): 123.8 lbs Total Body Water (lbs): 96.8 lbs Visceral Fat Rating : 19   Other Clinical Data Fasting: No Labs: No Today's Visit #: 29 Starting Date: 06/15/21    Chief Complaint: OBESITY   History of Present Illness   The patient is a 42 year old individual who presents for a follow-up visit to discuss her ongoing management of obesity, depression with emotional eating, and vitamin D deficiency. She reports a weight loss of two pounds since the last visit a month ago. She has been adhering to a food journaling plan with a daily calorie goal of 1500-1700 and a protein goal of at least 100 grams. She estimates adherence to this plan at approximately 70%. She has been engaging in strength training exercises for 30 minutes, three times a week.  The patient is currently on Zepbound for weight loss, Wellbutrin for depression with emotional eating, and a prescription vitamin D supplement. She requests a refill of all these medications. She reports that the side effects of Zepbound have subsided and she has four more pens of the medication left. She is unsure if she feels different on the medication but is open to the possibility that it may be working in ways she does not notice.  The patient has been more consistent with food journaling and has found success in having ready-to-go chopped vegetables such as cauliflower, broccoli, and carrots for snacking. She reports that this has helped curb her cravings and she has not felt deprived. She has  been experimenting with different sources of protein, such as [place] sausage, to add variety to her diet. She reports that when she pushes towards her protein goal, she feels full and her cravings are reduced.  The patient also shares that she recently lost a close friend suddenly, which has been emotionally challenging. She plans to attend a celebration of life for her friend in the coming weeks. She expresses some concern about her emotional response during this event but also hopes that it may provide some closure.          PHYSICAL EXAM:  Blood pressure 120/80, pulse 76, temperature (!) 97.4 F (36.3 C), height 5\' 2"  (1.575 m), weight 281 lb (127.5 kg), SpO2 94%. Body mass index is 51.4 kg/m.  DIAGNOSTIC DATA REVIEWED:  BMET    Component Value Date/Time   NA 140 10/12/2022 0828   K 4.3 10/12/2022 0828   CL 101 10/12/2022 0828   CO2 23 10/12/2022 0828   GLUCOSE 79 10/12/2022 0828   GLUCOSE 81 04/20/2022 0901   BUN 10 10/12/2022 0828   CREATININE 0.97 10/12/2022 0828   CALCIUM 9.3 10/12/2022 0828   GFRNONAA >60 04/20/2022 0901   GFRAA >90 07/20/2014 2128   Lab Results  Component Value Date   HGBA1C 5.2 10/12/2022   HGBA1C 5.4 06/15/2021   Lab Results  Component Value Date   INSULIN 20.3 10/12/2022   INSULIN 20.6 06/15/2021   Lab Results  Component Value Date   TSH 1.610 02/20/2022   CBC  Component Value Date/Time   WBC 9.3 04/20/2022 0901   RBC 4.68 04/20/2022 0901   HGB 14.5 04/20/2022 0901   HGB 14.3 06/15/2021 1036   HCT 41.8 04/20/2022 0901   HCT 43.7 06/15/2021 1036   PLT 326 04/20/2022 0901   PLT 332 06/15/2021 1036   MCV 89.3 04/20/2022 0901   MCV 92 06/15/2021 1036   MCH 31.0 04/20/2022 0901   MCHC 34.7 04/20/2022 0901   RDW 12.3 04/20/2022 0901   RDW 12.6 06/15/2021 1036   Iron Studies No results found for: "IRON", "TIBC", "FERRITIN", "IRONPCTSAT" Lipid Panel     Component Value Date/Time   CHOL 207 (H) 10/12/2022 0828   TRIG 110  10/12/2022 0828   HDL 48 10/12/2022 0828   CHOLHDL 4.4 06/15/2021 1036   LDLCALC 139 (H) 10/12/2022 0828   Hepatic Function Panel     Component Value Date/Time   PROT 6.7 10/12/2022 0828   ALBUMIN 4.1 10/12/2022 0828   AST 18 10/12/2022 0828   ALT 17 10/12/2022 0828   ALKPHOS 116 10/12/2022 0828   BILITOT 0.3 10/12/2022 0828      Component Value Date/Time   TSH 1.610 02/20/2022 0742   Nutritional Lab Results  Component Value Date   VD25OH 42.1 10/12/2022   VD25OH 39.7 02/20/2022   VD25OH 42.3 12/21/2021     Assessment and Plan    Obesity Patient has lost 2 pounds since last visit. She is following a food journaling plan with a calorie goal of 1500-1700 and a protein goal of at least 100 grams per day. She is exercising 3 times per week and is on Zepbound for weight loss. She is adhering to her plan approximately 70% of the time. -Continue Zepbound at 5mg  weekly. -Continue current calorie and protein goals. -Continue exercise regimen. -Check in at next appointment to assess progress and adjust treatment plan as necessary.  Depression with Emotional Eating Patient is on Wellbutrin and reports that her cravings are controlled when she meets her protein goals. She is also dealing with grief from the loss of a friend. -Continue Wellbutrin. -Encourage patient to continue focusing on meeting protein goals to control cravings. -Provide emotional support and acknowledge the patient's grief.  Vitamin D Deficiency Patient is on prescription Vitamin D and her last level was 42 in June. The goal is to get her level in the 50s. -Refill Vitamin D prescription. -Continue monitoring Vitamin D levels.  Follow-up in 3 weeks to assess progress and adjust treatment plan as necessary.         I have personally spent 40 minutes total time today in preparation, patient care, and documentation for this visit, including the following: review of clinical lab tests; review of medical  tests/procedures/services.    She was informed of the importance of frequent follow up visits to maximize her success with intensive lifestyle modifications for her multiple health conditions.    Quillian Quince, MD

## 2022-12-21 ENCOUNTER — Ambulatory Visit (HOSPITAL_BASED_OUTPATIENT_CLINIC_OR_DEPARTMENT_OTHER): Payer: BC Managed Care – PPO | Admitting: Orthopaedic Surgery

## 2022-12-22 ENCOUNTER — Ambulatory Visit (HOSPITAL_BASED_OUTPATIENT_CLINIC_OR_DEPARTMENT_OTHER): Payer: BC Managed Care – PPO | Admitting: Orthopaedic Surgery

## 2022-12-27 ENCOUNTER — Ambulatory Visit (HOSPITAL_BASED_OUTPATIENT_CLINIC_OR_DEPARTMENT_OTHER): Payer: BC Managed Care – PPO | Admitting: Orthopaedic Surgery

## 2022-12-28 ENCOUNTER — Ambulatory Visit (HOSPITAL_BASED_OUTPATIENT_CLINIC_OR_DEPARTMENT_OTHER): Payer: BC Managed Care – PPO | Admitting: Orthopaedic Surgery

## 2023-01-10 ENCOUNTER — Ambulatory Visit (INDEPENDENT_AMBULATORY_CARE_PROVIDER_SITE_OTHER): Payer: BC Managed Care – PPO | Admitting: Family Medicine

## 2023-01-10 ENCOUNTER — Encounter (INDEPENDENT_AMBULATORY_CARE_PROVIDER_SITE_OTHER): Payer: Self-pay | Admitting: Family Medicine

## 2023-01-10 ENCOUNTER — Other Ambulatory Visit (HOSPITAL_BASED_OUTPATIENT_CLINIC_OR_DEPARTMENT_OTHER): Payer: Self-pay

## 2023-01-10 VITALS — BP 136/94 | HR 95 | Temp 97.6°F | Ht 62.0 in | Wt 278.0 lb

## 2023-01-10 DIAGNOSIS — F5089 Other specified eating disorder: Secondary | ICD-10-CM

## 2023-01-10 DIAGNOSIS — Z6841 Body Mass Index (BMI) 40.0 and over, adult: Secondary | ICD-10-CM

## 2023-01-10 DIAGNOSIS — E669 Obesity, unspecified: Secondary | ICD-10-CM

## 2023-01-10 DIAGNOSIS — E559 Vitamin D deficiency, unspecified: Secondary | ICD-10-CM

## 2023-01-10 DIAGNOSIS — E88819 Insulin resistance, unspecified: Secondary | ICD-10-CM | POA: Diagnosis not present

## 2023-01-10 DIAGNOSIS — F39 Unspecified mood [affective] disorder: Secondary | ICD-10-CM

## 2023-01-10 MED ORDER — ZEPBOUND 5 MG/0.5ML ~~LOC~~ SOAJ
5.0000 mg | SUBCUTANEOUS | 0 refills | Status: DC
Start: 2023-01-10 — End: 2023-02-05
  Filled 2023-01-10: qty 2, 28d supply, fill #0

## 2023-01-10 MED ORDER — BUPROPION HCL ER (SR) 200 MG PO TB12
200.0000 mg | ORAL_TABLET | Freq: Two times a day (BID) | ORAL | 0 refills | Status: DC
Start: 2023-01-10 — End: 2023-03-06
  Filled 2023-01-10 – 2023-01-15 (×3): qty 60, 30d supply, fill #0

## 2023-01-10 MED ORDER — VITAMIN D (ERGOCALCIFEROL) 1.25 MG (50000 UNIT) PO CAPS
50000.0000 [IU] | ORAL_CAPSULE | ORAL | 0 refills | Status: DC
Start: 2023-01-10 — End: 2023-02-05
  Filled 2023-01-10 – 2023-01-13 (×5): qty 4, 28d supply, fill #0

## 2023-01-13 ENCOUNTER — Other Ambulatory Visit (HOSPITAL_BASED_OUTPATIENT_CLINIC_OR_DEPARTMENT_OTHER): Payer: Self-pay

## 2023-01-15 ENCOUNTER — Other Ambulatory Visit (HOSPITAL_BASED_OUTPATIENT_CLINIC_OR_DEPARTMENT_OTHER): Payer: Self-pay

## 2023-01-15 ENCOUNTER — Other Ambulatory Visit: Payer: Self-pay

## 2023-01-17 NOTE — Progress Notes (Unsigned)
Chief Complaint:   OBESITY Tara Kelley is here to discuss her progress with her obesity treatment plan along with follow-up of her obesity related diagnoses. Tara Kelley is on keeping a food journal and adhering to recommended goals of 1500-1700 calories and 100+ grams of protein and states she is following her eating plan approximately 50% of the time. Tara Kelley states she is doing strength training for 45 minutes 3 times per week.  Today's visit was #: 30 Starting weight: 311 lbs Starting date: 06/15/2021 Today's weight: 278 lbs Today's date: 01/10/2023 Total lbs lost to date: 33 Total lbs lost since last in-office visit: 3  Interim History: Patient continues to do well with her weight loss and with her exercise.  She is journaling intermittently, but she is still mindful of her food choices.  She will be traveling soon.  Subjective:   1. Insulin resistance Patient is doing well on Zepbound, and with her diet and exercise to help improve her insulin resistance and prevent onset of diabetes mellitus.  2. Vitamin D deficiency Patient is on vitamin D with no side effects noted; her vitamin D level is slowly improving.  3. Emotional Eating Behavior Patient's blood pressure is mildly elevated today, but normally well-controlled but she was likely rushing to get to her appointment on time.  She is doing better with remembering to take her second dose.  Assessment/Plan:   1. Insulin resistance Patient will continue Zepbound 5 mg once weekly, and we will refill for 1 month.  - tirzepatide (ZEPBOUND) 5 MG/0.5ML Pen; Inject 5 mg into the skin once a week.  Dispense: 2 mL; Refill: 0  2. Vitamin D deficiency Patient will continue prescription vitamin D once weekly, and we will refill for 1 month.  - Vitamin D, Ergocalciferol, (DRISDOL) 1.25 MG (50000 UNIT) CAPS capsule; Take 1 capsule (50,000 Units total) by mouth every 7 (seven) days.  Dispense: 4 capsule; Refill: 0  3. Emotional Eating  Behavior Patient will continue Wellbutrin SR 200 mg twice daily, and we will refill for 1 month.  - buPROPion (WELLBUTRIN SR) 200 MG 12 hr tablet; Take 1 tablet (200 mg total) by mouth 2 (two) times daily.  Dispense: 60 tablet; Refill: 0  4. BMI 50.0-59.9, adult (HCC)  5. Obesity, Beginning BMI 56.88 Tara Kelley is currently in the action stage of change. As such, her goal is to continue with weight loss efforts. She has agreed to keeping a food journal and adhering to recommended goals of 1500-1700 calories and 100+ grams of protein daily.   Exercise goals: As is.   Behavioral modification strategies: travel eating strategies.  Tara Kelley has agreed to follow-up with our clinic in 4 weeks. She was informed of the importance of frequent follow-up visits to maximize her success with intensive lifestyle modifications for her multiple health conditions.   Objective:   Blood pressure (!) 136/94, pulse 95, temperature 97.6 F (36.4 C), height 5\' 2"  (1.575 m), weight 278 lb (126.1 kg), SpO2 96%. Body mass index is 50.85 kg/m.  Lab Results  Component Value Date   CREATININE 0.97 10/12/2022   BUN 10 10/12/2022   NA 140 10/12/2022   K 4.3 10/12/2022   CL 101 10/12/2022   CO2 23 10/12/2022   Lab Results  Component Value Date   ALT 17 10/12/2022   AST 18 10/12/2022   ALKPHOS 116 10/12/2022   BILITOT 0.3 10/12/2022   Lab Results  Component Value Date   HGBA1C 5.2 10/12/2022   HGBA1C 5.4  02/20/2022   HGBA1C 5.4 12/21/2021   HGBA1C 5.4 06/15/2021   Lab Results  Component Value Date   INSULIN 20.3 10/12/2022   INSULIN 12.2 02/20/2022   INSULIN 21.5 12/21/2021   INSULIN 20.6 06/15/2021   Lab Results  Component Value Date   TSH 1.610 02/20/2022   Lab Results  Component Value Date   CHOL 207 (H) 10/12/2022   HDL 48 10/12/2022   LDLCALC 139 (H) 10/12/2022   TRIG 110 10/12/2022   CHOLHDL 4.4 06/15/2021   Lab Results  Component Value Date   VD25OH 42.1 10/12/2022   VD25OH 39.7  02/20/2022   VD25OH 42.3 12/21/2021   Lab Results  Component Value Date   WBC 9.3 04/20/2022   HGB 14.5 04/20/2022   HCT 41.8 04/20/2022   MCV 89.3 04/20/2022   PLT 326 04/20/2022   No results found for: "IRON", "TIBC", "FERRITIN"  Attestation Statements:   Reviewed by clinician on day of visit: allergies, medications, problem list, medical history, surgical history, family history, social history, and previous encounter notes.  I have personally spent 40 minutes total time today in preparation, patient care, and documentation for this visit, including the following: review of clinical lab tests; review of medical tests/procedures/services.  I, Burt Knack, am acting as transcriptionist for Quillian Quince, MD.  I have reviewed the above documentation for accuracy and completeness, and I agree with the above. -  Quillian Quince, MD

## 2023-01-25 ENCOUNTER — Ambulatory Visit (INDEPENDENT_AMBULATORY_CARE_PROVIDER_SITE_OTHER): Payer: BC Managed Care – PPO | Admitting: Family Medicine

## 2023-01-25 ENCOUNTER — Other Ambulatory Visit (HOSPITAL_BASED_OUTPATIENT_CLINIC_OR_DEPARTMENT_OTHER): Payer: Self-pay

## 2023-01-25 ENCOUNTER — Encounter (HOSPITAL_BASED_OUTPATIENT_CLINIC_OR_DEPARTMENT_OTHER): Payer: Self-pay | Admitting: Family Medicine

## 2023-01-25 VITALS — BP 130/89 | HR 95 | Ht 62.0 in | Wt 281.4 lb

## 2023-01-25 DIAGNOSIS — L900 Lichen sclerosus et atrophicus: Secondary | ICD-10-CM

## 2023-01-25 DIAGNOSIS — Z23 Encounter for immunization: Secondary | ICD-10-CM

## 2023-01-25 DIAGNOSIS — G5602 Carpal tunnel syndrome, left upper limb: Secondary | ICD-10-CM | POA: Insufficient documentation

## 2023-01-25 MED ORDER — MELOXICAM 15 MG PO TABS
15.0000 mg | ORAL_TABLET | Freq: Every day | ORAL | 0 refills | Status: DC
Start: 2023-01-25 — End: 2023-05-08
  Filled 2023-01-25: qty 30, 30d supply, fill #0
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
## 2023-01-25 NOTE — Patient Instructions (Signed)
Please start wearing a thumb spica brace to the left hand, use ice frequenlty to the wrist and take Meloxicam with food daily. Perform exercises 2x-3x daily.    Please use the Clobetasol cream to patches of rash.

## 2023-01-31 ENCOUNTER — Encounter (HOSPITAL_BASED_OUTPATIENT_CLINIC_OR_DEPARTMENT_OTHER): Payer: Self-pay | Admitting: Family Medicine

## 2023-01-31 NOTE — Telephone Encounter (Signed)
Alexis please see mychart sent by pt and advise.

## 2023-02-05 ENCOUNTER — Ambulatory Visit (INDEPENDENT_AMBULATORY_CARE_PROVIDER_SITE_OTHER): Payer: BC Managed Care – PPO | Admitting: Family Medicine

## 2023-02-05 ENCOUNTER — Other Ambulatory Visit (HOSPITAL_BASED_OUTPATIENT_CLINIC_OR_DEPARTMENT_OTHER): Payer: Self-pay

## 2023-02-05 ENCOUNTER — Encounter (INDEPENDENT_AMBULATORY_CARE_PROVIDER_SITE_OTHER): Payer: Self-pay | Admitting: Family Medicine

## 2023-02-05 VITALS — BP 103/71 | HR 92 | Temp 97.4°F | Ht 62.0 in | Wt 275.0 lb

## 2023-02-05 DIAGNOSIS — F439 Reaction to severe stress, unspecified: Secondary | ICD-10-CM | POA: Diagnosis not present

## 2023-02-05 DIAGNOSIS — G5602 Carpal tunnel syndrome, left upper limb: Secondary | ICD-10-CM

## 2023-02-05 DIAGNOSIS — E669 Obesity, unspecified: Secondary | ICD-10-CM

## 2023-02-05 DIAGNOSIS — E559 Vitamin D deficiency, unspecified: Secondary | ICD-10-CM | POA: Diagnosis not present

## 2023-02-05 DIAGNOSIS — K219 Gastro-esophageal reflux disease without esophagitis: Secondary | ICD-10-CM | POA: Diagnosis not present

## 2023-02-05 DIAGNOSIS — Z6841 Body Mass Index (BMI) 40.0 and over, adult: Secondary | ICD-10-CM

## 2023-02-05 MED ORDER — OMEPRAZOLE 20 MG PO CPDR
20.0000 mg | DELAYED_RELEASE_CAPSULE | Freq: Every day | ORAL | 0 refills | Status: DC
Start: 2023-02-05 — End: 2023-03-06
  Filled 2023-02-05: qty 30, 30d supply, fill #0

## 2023-02-05 MED ORDER — ZEPBOUND 5 MG/0.5ML ~~LOC~~ SOAJ
5.0000 mg | SUBCUTANEOUS | 0 refills | Status: DC
  Filled 2023-02-05: qty 2, 28d supply, fill #0

## 2023-02-05 MED ORDER — VITAMIN D (ERGOCALCIFEROL) 1.25 MG (50000 UNIT) PO CAPS
50000.0000 [IU] | ORAL_CAPSULE | ORAL | 0 refills | Status: DC
  Filled 2023-02-05: qty 4, 28d supply, fill #0
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
## 2023-02-06 ENCOUNTER — Encounter (INDEPENDENT_AMBULATORY_CARE_PROVIDER_SITE_OTHER): Payer: Self-pay | Admitting: *Deleted

## 2023-02-09 ENCOUNTER — Telehealth: Payer: Self-pay | Admitting: *Deleted

## 2023-02-09 NOTE — Telephone Encounter (Signed)
PA SUBMITTED VIA [employer] FOR ZEPBOUND  Tara Kelley (Key: [id number])  Your information has been sent to [facility].

## 2023-02-09 NOTE — Telephone Encounter (Signed)
Medication was approved and patient notified via Mychart.   Outcome Approved today by [employer] 2017 PA Case: [id number], Status: Approved, Coverage Starts on: 02/09/2023 12:00:00 AM, Coverage Ends on: 08/08/2023 12:00:00 AM. Authorization Expiration Date: 08/07/2023

## 2023-02-11 ENCOUNTER — Other Ambulatory Visit (HOSPITAL_BASED_OUTPATIENT_CLINIC_OR_DEPARTMENT_OTHER): Payer: Self-pay

## 2023-02-12 ENCOUNTER — Other Ambulatory Visit (HOSPITAL_BASED_OUTPATIENT_CLINIC_OR_DEPARTMENT_OTHER): Payer: Self-pay

## 2023-02-17 DIAGNOSIS — Z1231 Encounter for screening mammogram for malignant neoplasm of breast: Secondary | ICD-10-CM | POA: Diagnosis not present

## 2023-03-06 ENCOUNTER — Ambulatory Visit (INDEPENDENT_AMBULATORY_CARE_PROVIDER_SITE_OTHER): Payer: BC Managed Care – PPO | Admitting: Family Medicine

## 2023-03-06 ENCOUNTER — Encounter (INDEPENDENT_AMBULATORY_CARE_PROVIDER_SITE_OTHER): Payer: Self-pay | Admitting: Family Medicine

## 2023-03-06 ENCOUNTER — Telehealth (INDEPENDENT_AMBULATORY_CARE_PROVIDER_SITE_OTHER): Payer: Self-pay

## 2023-03-06 ENCOUNTER — Other Ambulatory Visit: Payer: Self-pay

## 2023-03-06 ENCOUNTER — Other Ambulatory Visit (HOSPITAL_BASED_OUTPATIENT_CLINIC_OR_DEPARTMENT_OTHER): Payer: Self-pay

## 2023-03-06 VITALS — BP 122/77 | HR 90 | Temp 98.1°F | Ht 62.0 in | Wt 274.0 lb

## 2023-03-06 DIAGNOSIS — E669 Obesity, unspecified: Secondary | ICD-10-CM

## 2023-03-06 DIAGNOSIS — E559 Vitamin D deficiency, unspecified: Secondary | ICD-10-CM | POA: Diagnosis not present

## 2023-03-06 DIAGNOSIS — F39 Unspecified mood [affective] disorder: Secondary | ICD-10-CM

## 2023-03-06 DIAGNOSIS — Z6841 Body Mass Index (BMI) 40.0 and over, adult: Secondary | ICD-10-CM

## 2023-03-06 DIAGNOSIS — K219 Gastro-esophageal reflux disease without esophagitis: Secondary | ICD-10-CM

## 2023-03-06 HISTORY — DX: Gastro-esophageal reflux disease without esophagitis: K21.9

## 2023-03-06 MED ORDER — OMEPRAZOLE 20 MG PO CPDR
20.0000 mg | DELAYED_RELEASE_CAPSULE | Freq: Every day | ORAL | 0 refills | Status: DC
  Filled 2023-03-06 (×2): qty 30, 30d supply, fill #0

## 2023-03-06 MED ORDER — BUPROPION HCL ER (SR) 200 MG PO TB12
200.0000 mg | ORAL_TABLET | Freq: Two times a day (BID) | ORAL | 0 refills | Status: DC
  Filled 2023-03-06: qty 60, 30d supply, fill #0

## 2023-03-06 MED ORDER — ZEPBOUND 5 MG/0.5ML ~~LOC~~ SOAJ
5.0000 mg | SUBCUTANEOUS | 0 refills | Status: DC
  Filled 2023-03-06 – 2023-03-09 (×2): qty 2, 28d supply, fill #0

## 2023-03-06 MED ORDER — VITAMIN D (ERGOCALCIFEROL) 1.25 MG (50000 UNIT) PO CAPS
50000.0000 [IU] | ORAL_CAPSULE | ORAL | 0 refills | Status: DC
  Filled 2023-03-06: qty 4, 28d supply, fill #0
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
## 2023-03-06 NOTE — Telephone Encounter (Signed)
Prior Auth submitted for Omeprazole.  A prior Berkley Harvey is not needed as it is available without authorization.

## 2023-03-09 ENCOUNTER — Other Ambulatory Visit (HOSPITAL_BASED_OUTPATIENT_CLINIC_OR_DEPARTMENT_OTHER): Payer: Self-pay

## 2023-03-12 ENCOUNTER — Other Ambulatory Visit (HOSPITAL_BASED_OUTPATIENT_CLINIC_OR_DEPARTMENT_OTHER): Payer: Self-pay

## 2023-04-01 ENCOUNTER — Other Ambulatory Visit (HOSPITAL_BASED_OUTPATIENT_CLINIC_OR_DEPARTMENT_OTHER): Payer: Self-pay | Admitting: Family Medicine

## 2023-04-02 ENCOUNTER — Other Ambulatory Visit (HOSPITAL_BASED_OUTPATIENT_CLINIC_OR_DEPARTMENT_OTHER): Payer: Self-pay

## 2023-04-02 MED ORDER — CITALOPRAM HYDROBROMIDE 20 MG PO TABS
30.0000 mg | ORAL_TABLET | Freq: Every day | ORAL | 5 refills | Status: DC
  Filled 2023-04-02 – 2023-04-03 (×3): qty 45, 30d supply, fill #0
  Filled 2023-05-01: qty 45, 30d supply, fill #1
  Filled 2023-05-21 – 2023-05-30 (×2): qty 45, 30d supply, fill #2
  Filled 2023-06-10 – 2023-06-27 (×2): qty 45, 30d supply, fill #3
  Filled 2023-08-03 – 2023-08-04 (×2): qty 45, 30d supply, fill #4
  Filled 2023-09-03: qty 45, 30d supply, fill #5

## 2023-04-03 ENCOUNTER — Other Ambulatory Visit (HOSPITAL_BASED_OUTPATIENT_CLINIC_OR_DEPARTMENT_OTHER): Payer: Self-pay

## 2023-04-09 ENCOUNTER — Ambulatory Visit (INDEPENDENT_AMBULATORY_CARE_PROVIDER_SITE_OTHER): Payer: BC Managed Care – PPO | Admitting: Family Medicine

## 2023-04-09 ENCOUNTER — Encounter (INDEPENDENT_AMBULATORY_CARE_PROVIDER_SITE_OTHER): Payer: Self-pay | Admitting: Family Medicine

## 2023-04-09 VITALS — BP 116/86 | Temp 97.5°F | Ht 62.0 in | Wt 268.0 lb

## 2023-04-09 DIAGNOSIS — R632 Polyphagia: Secondary | ICD-10-CM

## 2023-04-09 DIAGNOSIS — E559 Vitamin D deficiency, unspecified: Secondary | ICD-10-CM

## 2023-04-09 DIAGNOSIS — F39 Unspecified mood [affective] disorder: Secondary | ICD-10-CM

## 2023-04-09 DIAGNOSIS — K219 Gastro-esophageal reflux disease without esophagitis: Secondary | ICD-10-CM

## 2023-04-09 DIAGNOSIS — Z6841 Body Mass Index (BMI) 40.0 and over, adult: Secondary | ICD-10-CM

## 2023-04-09 DIAGNOSIS — E669 Obesity, unspecified: Secondary | ICD-10-CM

## 2023-04-09 MED ORDER — OMEPRAZOLE 20 MG PO CPDR
20.0000 mg | DELAYED_RELEASE_CAPSULE | Freq: Every day | ORAL | 0 refills | Status: DC
  Filled 2023-04-16: qty 30, 30d supply, fill #0

## 2023-04-09 MED ORDER — VITAMIN D (ERGOCALCIFEROL) 1.25 MG (50000 UNIT) PO CAPS
50000.0000 [IU] | ORAL_CAPSULE | ORAL | 0 refills | Status: DC
  Filled 2023-04-16: qty 4, 28d supply, fill #0

## 2023-04-09 MED ORDER — BUPROPION HCL ER (SR) 200 MG PO TB12
200.0000 mg | ORAL_TABLET | Freq: Two times a day (BID) | ORAL | 0 refills | Status: DC
  Filled 2023-04-16: qty 60, 30d supply, fill #0

## 2023-04-09 MED ORDER — ZEPBOUND 5 MG/0.5ML ~~LOC~~ SOAJ
5.0000 mg | SUBCUTANEOUS | 0 refills | Status: DC
  Filled 2023-04-16: qty 2, 28d supply, fill #0

## 2023-04-09 NOTE — Progress Notes (Signed)
.smr  Office: [phone]  /  Fax: [phone]  WEIGHT SUMMARY AND BIOMETRICS  Anthropometric Measurements Height: 5\' 2"  (1.575 m) Weight: 268 lb (121.6 kg) BMI (Calculated): 49.01 Weight at Last Visit: 274 lb Weight Lost Since Last Visit: 6 lb Weight Gained Since Last Visit: 0 Starting Weight: 311 lb Total Weight Loss (lbs): 43 lb (19.5 kg) Peak Weight: 311 lb   Body Composition  Body Fat %: 52 % Fat Mass (lbs): 139.4 lbs Muscle Mass (lbs): 122.2 lbs Total Body Water (lbs): 93.2 lbs Visceral Fat Rating : 18   Other Clinical Data Fasting: no Labs: no Today's Visit #: 33 Starting Date: 06/15/21    Chief Complaint: OBESITY  History of Present Illness   The patient, with a history of vitamin D deficiency, GERD, emotional eating behaviors, polyphasia, and obesity, presents for a follow-up visit. She reports a weight loss of six pounds over the past month, achieved through dietary changes and maintaining a calorie goal of 1500 to 1700 with at least 100 or more grams of protein. However, she admits to not currently engaging in any exercise regimen.  The patient has been journaling her food intake about 50% of the time and has noticed a decrease in emotional eating since becoming single. She attributes this improvement to a reduction in eating out and takeout meals, which were frequent when she was in a relationship. Instead, she now prepares her own meals at home, which she believes are lower in calories.  Regarding her medications, the patient reports no issues. She is currently on Zepbound, which she finds beneficial, but she experiences a slight queasiness one to two days after taking it. She expresses concern about the dosage, comparing it to others who are on higher doses. However, she is reassured that the dosage is individualized and what works for her is the correct dose.  The patient also takes Prilosec for her GERD, which she reports is well controlled with no recent  episodes of heartburn. She is also on Wellbutrin and Celexa, with no reported issues. 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 She expresses a desire to be paid fairly for her work and is considering looking for new opportunities in the coming months.          PHYSICAL EXAM:  Blood pressure 116/86, temperature (!) 97.5 F (36.4 C), height 5\' 2"  (1.575 m), weight 268 lb (121.6 kg). Body mass index is 49.02 kg/m.  DIAGNOSTIC DATA REVIEWED:  BMET    Component Value Date/Time   NA 140 10/12/2022 0828   K 4.3 10/12/2022 0828   CL 101 10/12/2022 0828   CO2 23 10/12/2022 0828   GLUCOSE 79 10/12/2022 0828   GLUCOSE 81 04/20/2022 0901   BUN 10 10/12/2022 0828   CREATININE 0.97 10/12/2022 0828   CALCIUM 9.3 10/12/2022 0828   GFRNONAA >60 04/20/2022 0901   GFRAA >90 07/20/2014 2128   Lab Results  Component Value Date   HGBA1C 5.2 10/12/2022   HGBA1C 5.4 06/15/2021   Lab Results  Component Value Date   INSULIN 20.3 10/12/2022   INSULIN 20.6 06/15/2021   Lab Results  Component Value Date   TSH 1.610 02/20/2022   CBC    Component Value Date/Time   WBC 9.3 04/20/2022 0901   RBC 4.68 04/20/2022 0901   HGB 14.5 04/20/2022 0901   HGB 14.3 06/15/2021 1036   HCT 41.8 04/20/2022 0901   HCT 43.7  06/15/2021 1036   PLT 326 04/20/2022 0901   PLT 332 06/15/2021 1036   MCV 89.3 04/20/2022 0901   MCV 92 06/15/2021 1036   MCH 31.0 04/20/2022 0901   MCHC 34.7 04/20/2022 0901   RDW 12.3 04/20/2022 0901   RDW 12.6 06/15/2021 1036   Iron Studies No results found for: "IRON", "TIBC", "FERRITIN", "IRONPCTSAT" Lipid Panel     Component Value Date/Time   CHOL 207 (H) 10/12/2022 0828   TRIG 110 10/12/2022 0828   HDL 48 10/12/2022 0828   CHOLHDL 4.4 06/15/2021 1036   LDLCALC 139 (H) 10/12/2022 0828   Hepatic Function Panel     Component Value Date/Time   PROT 6.7 10/12/2022 0828   ALBUMIN  4.1 10/12/2022 0828   AST 18 10/12/2022 0828   ALT 17 10/12/2022 0828   ALKPHOS 116 10/12/2022 0828   BILITOT 0.3 10/12/2022 0828      Component Value Date/Time   TSH 1.610 02/20/2022 0742   Nutritional Lab Results  Component Value Date   VD25OH 42.1 10/12/2022   VD25OH 39.7 02/20/2022   VD25OH 42.3 12/21/2021     Assessment and Plan    Polyphagia Currently on Zepbound for polyphagia. Reports mild queasiness one to two days post-dose but no significant adverse effects. Discussed variability in patient response to Zepbound and emphasized avoiding compounded versions due to quality concerns. - Refill Zepbound  Obesity Lost six pounds in the last month. Journaling food intake about 50% of the time with a calorie goal of 1500-1700 and at least 100 grams of protein. Not currently exercising. Discussed long-term use of GLP-1 drugs like Zepbound, noting potential benefits such as reduced risk of cardiovascular events and certain cancers. Emphasized the importance of diet and exercise for sustained weight loss. - Continue current dietary plan 1500-1700 calories and 100+ gm protein daily - Encourage regular exercise  Emotional Eating Reports improvement in emotional eating behaviors, particularly since becoming single and receiving assistance with home-cooked meals. Blood pressure well-controlled. On Wellbutrin and Celexa with no new issues reported. Mood is stable. - Refill Wellbutrin - Continue Celexa - Monitor emotional eating behaviors  Vitamin D Deficiency Vitamin D deficiency with no new symptoms reported. - Refill vitamin D  Gastroesophageal Reflux Disease (GERD) GERD well-controlled with Prilosec. No recent episodes of heartburn reported. - Refill Prilosec     Follow-up - Schedule follow-up appointments for January February and March 2025.       She was informed of the importance of frequent follow up visits to maximize her success with intensive lifestyle  modifications for her multiple health conditions.    Quillian Quince, MD

## 2023-04-14 ENCOUNTER — Other Ambulatory Visit (INDEPENDENT_AMBULATORY_CARE_PROVIDER_SITE_OTHER): Payer: Self-pay | Admitting: Family Medicine

## 2023-04-14 DIAGNOSIS — K219 Gastro-esophageal reflux disease without esophagitis: Secondary | ICD-10-CM

## 2023-04-14 DIAGNOSIS — E559 Vitamin D deficiency, unspecified: Secondary | ICD-10-CM

## 2023-04-14 DIAGNOSIS — F39 Unspecified mood [affective] disorder: Secondary | ICD-10-CM

## 2023-04-14 DIAGNOSIS — R632 Polyphagia: Secondary | ICD-10-CM

## 2023-04-14 DIAGNOSIS — E669 Obesity, unspecified: Secondary | ICD-10-CM

## 2023-04-16 ENCOUNTER — Encounter (INDEPENDENT_AMBULATORY_CARE_PROVIDER_SITE_OTHER): Payer: Self-pay | Admitting: Family Medicine

## 2023-04-16 ENCOUNTER — Other Ambulatory Visit: Payer: Self-pay

## 2023-04-16 ENCOUNTER — Other Ambulatory Visit (INDEPENDENT_AMBULATORY_CARE_PROVIDER_SITE_OTHER): Payer: Self-pay | Admitting: Family Medicine

## 2023-04-16 ENCOUNTER — Other Ambulatory Visit (HOSPITAL_BASED_OUTPATIENT_CLINIC_OR_DEPARTMENT_OTHER): Payer: Self-pay

## 2023-04-16 DIAGNOSIS — E559 Vitamin D deficiency, unspecified: Secondary | ICD-10-CM

## 2023-04-16 DIAGNOSIS — K219 Gastro-esophageal reflux disease without esophagitis: Secondary | ICD-10-CM

## 2023-04-16 DIAGNOSIS — F39 Unspecified mood [affective] disorder: Secondary | ICD-10-CM

## 2023-04-16 MED ORDER — LOSARTAN POTASSIUM 100 MG PO TABS
100.0000 mg | ORAL_TABLET | Freq: Every day | ORAL | 2 refills | Status: DC
  Filled 2023-04-16: qty 90, 90d supply, fill #0
  Filled 2023-05-21 – 2023-07-19 (×3): qty 90, 90d supply, fill #1
  Filled 2023-10-08: qty 20, 20d supply, fill #2

## 2023-04-27 ENCOUNTER — Ambulatory Visit (HOSPITAL_BASED_OUTPATIENT_CLINIC_OR_DEPARTMENT_OTHER): Payer: BC Managed Care – PPO | Admitting: Family Medicine

## 2023-04-27 ENCOUNTER — Encounter (HOSPITAL_BASED_OUTPATIENT_CLINIC_OR_DEPARTMENT_OTHER): Payer: Self-pay | Admitting: Family Medicine

## 2023-04-27 ENCOUNTER — Other Ambulatory Visit (HOSPITAL_BASED_OUTPATIENT_CLINIC_OR_DEPARTMENT_OTHER): Payer: Self-pay

## 2023-04-27 VITALS — BP 128/86 | HR 87 | Temp 97.8°F | Ht 62.0 in | Wt 271.7 lb

## 2023-04-27 DIAGNOSIS — J019 Acute sinusitis, unspecified: Secondary | ICD-10-CM | POA: Diagnosis not present

## 2023-04-27 LAB — POCT INFLUENZA A/B
Influenza A, POC: NEGATIVE
Influenza B, POC: NEGATIVE

## 2023-04-27 LAB — POCT RAPID STREP A (OFFICE): Rapid Strep A Screen: NEGATIVE

## 2023-04-27 LAB — POC COVID19 BINAXNOW: SARS Coronavirus 2 Ag: NEGATIVE

## 2023-04-27 MED ORDER — FLUCONAZOLE 150 MG PO TABS
150.0000 mg | ORAL_TABLET | Freq: Once | ORAL | 0 refills | Status: AC
  Filled 2023-04-27: qty 2, 2d supply, fill #0

## 2023-04-27 MED ORDER — AMOXICILLIN-POT CLAVULANATE 875-125 MG PO TABS
1.0000 | ORAL_TABLET | Freq: Two times a day (BID) | ORAL | 0 refills | Status: AC
  Filled 2023-04-27: qty 14, 7d supply, fill #0
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
## 2023-04-27 NOTE — Patient Instructions (Signed)
  Rest, drink plenty of fluids.  Tylenol for fever, body aches.   For cough: Take Mucinex DM or Robitussin-DM OTC.  Follow the instructions in the box.  For nasal congestion: -Use over-the-counter Flonase: 2 nasal sprays on each side of the nose in the morning until you feel better  -Use OTC Astepro 2 nasal sprays on each side of the nose twice daily until better  Avoid decongestants such as  Pseudoephedrine or phenylephrine   Take the antibiotic as prescribed    Call if not gradually better over the next  10 days  Call anytime if the symptoms are severe, you have high fever, short of breath, chest pain

## 2023-05-07 ENCOUNTER — Encounter (HOSPITAL_BASED_OUTPATIENT_CLINIC_OR_DEPARTMENT_OTHER): Payer: Self-pay | Admitting: Family Medicine

## 2023-05-07 NOTE — Telephone Encounter (Signed)
 Jon Gills, please see mychart sent by pt and advise.

## 2023-05-08 ENCOUNTER — Other Ambulatory Visit (HOSPITAL_BASED_OUTPATIENT_CLINIC_OR_DEPARTMENT_OTHER): Payer: Self-pay | Admitting: Family Medicine

## 2023-05-08 ENCOUNTER — Other Ambulatory Visit (HOSPITAL_BASED_OUTPATIENT_CLINIC_OR_DEPARTMENT_OTHER): Payer: Self-pay

## 2023-05-08 ENCOUNTER — Ambulatory Visit (INDEPENDENT_AMBULATORY_CARE_PROVIDER_SITE_OTHER): Payer: BC Managed Care – PPO | Admitting: Family Medicine

## 2023-05-08 ENCOUNTER — Encounter (INDEPENDENT_AMBULATORY_CARE_PROVIDER_SITE_OTHER): Payer: Self-pay | Admitting: Family Medicine

## 2023-05-08 VITALS — BP 118/82 | HR 94 | Temp 98.2°F | Ht 62.0 in | Wt 268.0 lb

## 2023-05-08 DIAGNOSIS — Z6841 Body Mass Index (BMI) 40.0 and over, adult: Secondary | ICD-10-CM

## 2023-05-08 DIAGNOSIS — R5383 Other fatigue: Secondary | ICD-10-CM

## 2023-05-08 DIAGNOSIS — R632 Polyphagia: Secondary | ICD-10-CM

## 2023-05-08 DIAGNOSIS — R052 Subacute cough: Secondary | ICD-10-CM

## 2023-05-08 DIAGNOSIS — K219 Gastro-esophageal reflux disease without esophagitis: Secondary | ICD-10-CM

## 2023-05-08 DIAGNOSIS — E669 Obesity, unspecified: Secondary | ICD-10-CM

## 2023-05-08 DIAGNOSIS — F39 Unspecified mood [affective] disorder: Secondary | ICD-10-CM

## 2023-05-08 DIAGNOSIS — E559 Vitamin D deficiency, unspecified: Secondary | ICD-10-CM | POA: Diagnosis not present

## 2023-05-08 MED ORDER — VITAMIN D (ERGOCALCIFEROL) 1.25 MG (50000 UNIT) PO CAPS
50000.0000 [IU] | ORAL_CAPSULE | ORAL | 0 refills | Status: DC
  Filled 2023-05-08 – 2023-05-10 (×2): qty 4, 28d supply, fill #0

## 2023-05-08 MED ORDER — ZEPBOUND 5 MG/0.5ML ~~LOC~~ SOAJ
5.0000 mg | SUBCUTANEOUS | 0 refills | Status: DC
  Filled 2023-05-08 – 2023-05-10 (×2): qty 2, 28d supply, fill #0

## 2023-05-08 MED ORDER — OMEPRAZOLE 20 MG PO CPDR
20.0000 mg | DELAYED_RELEASE_CAPSULE | Freq: Every day | ORAL | 0 refills | Status: DC
  Filled 2023-05-08 – 2023-05-24 (×2): qty 30, 30d supply, fill #0

## 2023-05-08 MED ORDER — BUPROPION HCL ER (SR) 200 MG PO TB12
200.0000 mg | ORAL_TABLET | Freq: Two times a day (BID) | ORAL | 0 refills | Status: DC
  Filled 2023-05-08 – 2023-05-21 (×2): qty 60, 30d supply, fill #0

## 2023-05-08 MED ORDER — AZITHROMYCIN 250 MG PO TABS
ORAL_TABLET | ORAL | 0 refills | Status: AC
  Filled 2023-05-08: qty 6, 5d supply, fill #0

## 2023-05-08 NOTE — Progress Notes (Signed)
 .smr  Office: [phone]  /  Fax: [phone]  WEIGHT SUMMARY AND BIOMETRICS  Anthropometric Measurements Height: 5' 2 (1.575 m) Weight: 268 lb (121.6 kg) BMI (Calculated): 49.01 Weight at Last Visit: 268 lb Weight Lost Since Last Visit: 0 Weight Gained Since Last Visit: 0 Starting Weight: 311 lb Total Weight Loss (lbs): 43 lb (19.5 kg) Peak Weight: 311 lb   Body Composition  Body Fat %: 51.2 % Fat Mass (lbs): 137.2 lbs Muscle Mass (lbs): 124.2 lbs Total Body Water (lbs): 91.6 lbs Visceral Fat Rating : 17   Other Clinical Data Fasting: No Labs: No Today's Visit #: 34 Starting Date: 06/15/21    Chief Complaint: OBESITY   History of Present Illness   The patient, with a history of GERD, emotional eating behaviors, vitamin D  deficiency, polyphasia, and obesity, presents for a follow-up visit. She is currently on Prilosec for GERD, Zepbound  for polyphasia, Wellbutrin  SR for emotional eating behaviors, and prescription vitamin D  for vitamin D  deficiency. She reports successful weight maintenance over the holiday season, despite inconsistent journaling and a lack of recent exercise.  The patient has been experiencing a lingering cough, described as productive, for several weeks. She initially thought it was a sinus infection and completed a seven-day course of Augmentin , which provided some relief but did not fully resolve the symptoms. She has been self-medicating with Mucinex DM, Tylenol  PM for cold and flu symptoms at night, and Flonase. She also reports a loss of smell and altered taste, but testing for flu, COVID, and strep came back negative.  The patient also reports fatigue and a desire to sleep frequently. She admits to not hydrating as well as she could. She experienced some stomach discomfort while on Augmentin , but this resolved after completing the course. She has not been exercising due to the cold weather and her current health status. She expresses a desire to  improve her balance and core strength once she recovers.  The patient's medications appear to be well-tolerated, with the patient noting an improvement in general well-being since starting Prilosec. She has two upcoming appointments already scheduled.          PHYSICAL EXAM:  Blood pressure 118/82, pulse 94, temperature 98.2 F (36.8 C), height 5' 2 (1.575 m), weight 268 lb (121.6 kg), last menstrual period 04/22/2023, SpO2 97%. Body mass index is 49.02 kg/m.  DIAGNOSTIC DATA REVIEWED:  BMET    Component Value Date/Time   NA 140 10/12/2022 0828   K 4.3 10/12/2022 0828   CL 101 10/12/2022 0828   CO2 23 10/12/2022 0828   GLUCOSE 79 10/12/2022 0828   GLUCOSE 81 04/20/2022 0901   BUN 10 10/12/2022 0828   CREATININE 0.97 10/12/2022 0828   CALCIUM  9.3 10/12/2022 0828   GFRNONAA >60 04/20/2022 0901   GFRAA >90 07/20/2014 2128   Lab Results  Component Value Date   HGBA1C 5.2 10/12/2022   HGBA1C 5.4 06/15/2021   Lab Results  Component Value Date   INSULIN  20.3 10/12/2022   INSULIN  20.6 06/15/2021   Lab Results  Component Value Date   TSH 1.610 02/20/2022   CBC    Component Value Date/Time   WBC 9.3 04/20/2022 0901   RBC 4.68 04/20/2022 0901   HGB 14.5 04/20/2022 0901   HGB 14.3 06/15/2021 1036   HCT 41.8 04/20/2022 0901   HCT 43.7 06/15/2021 1036   PLT 326 04/20/2022 0901   PLT 332 06/15/2021 1036   MCV 89.3 04/20/2022 0901   MCV 92 06/15/2021  1036   MCH 31.0 04/20/2022 0901   MCHC 34.7 04/20/2022 0901   RDW 12.3 04/20/2022 0901   RDW 12.6 06/15/2021 1036   Iron Studies No results found for: IRON, TIBC, FERRITIN, IRONPCTSAT Lipid Panel     Component Value Date/Time   CHOL 207 (H) 10/12/2022 0828   TRIG 110 10/12/2022 0828   HDL 48 10/12/2022 0828   CHOLHDL 4.4 06/15/2021 1036   LDLCALC 139 (H) 10/12/2022 0828   Hepatic Function Panel     Component Value Date/Time   PROT 6.7 10/12/2022 0828   ALBUMIN 4.1 10/12/2022 0828   AST 18 10/12/2022  0828   ALT 17 10/12/2022 0828   ALKPHOS 116 10/12/2022 0828   BILITOT 0.3 10/12/2022 0828      Component Value Date/Time   TSH 1.610 02/20/2022 0742   Nutritional Lab Results  Component Value Date   VD25OH 42.1 10/12/2022   VD25OH 39.7 02/20/2022   VD25OH 42.3 12/21/2021     Assessment and Plan    Cough and Fatigue Persistent cough and fatigue post-sinus infection treated with Augmentin . Negative for COVID, flu, and strep. Symptoms include productive cough and anosmia. Many have experienced similar lingering symptoms recently. - Recommend Delsym for cough management - Encourage rest and hydration - Use humidifier in the bedroom  GERD GERD well-managed with Prilosec. Improved symptoms and reduced nausea reported. - Refill Prilosec  Polyphagia Polyphagia managed with Zepbound . No issues reported. - Refill Zepbound   Emotional Eating Behavior Emotional eating behavior managed with Wellbutrin  SR. No issues reported. - Refill Wellbutrin  SR  Obesity Weight maintained over holidays despite illness. No recent exercise due to illness. Focus on hydration and protein intake for recovery. - Encourage hydration and protein intake - Delay exercise until recovery  Vitamin D  Deficiency Vitamin D  deficiency managed with prescription vitamin D . No issues reported. - Refill prescription vitamin D   General Health Maintenance Discussed importance of balance and core strength exercises for long-term health. Encouraged hydration and protein intake for recovery. - Encourage balance and core strength exercises - Encourage hydration and protein intake  Follow-up - Follow-up in four weeks.         She was informed of the importance of frequent follow up visits to maximize her success with intensive lifestyle modifications for her multiple health conditions.    Louann Penton, MD

## 2023-05-09 ENCOUNTER — Other Ambulatory Visit (HOSPITAL_BASED_OUTPATIENT_CLINIC_OR_DEPARTMENT_OTHER): Payer: Self-pay

## 2023-05-09 ENCOUNTER — Encounter (INDEPENDENT_AMBULATORY_CARE_PROVIDER_SITE_OTHER): Payer: Self-pay | Admitting: Family Medicine

## 2023-05-10 ENCOUNTER — Other Ambulatory Visit (HOSPITAL_BASED_OUTPATIENT_CLINIC_OR_DEPARTMENT_OTHER): Payer: Self-pay

## 2023-05-21 ENCOUNTER — Other Ambulatory Visit (HOSPITAL_BASED_OUTPATIENT_CLINIC_OR_DEPARTMENT_OTHER): Payer: Self-pay

## 2023-05-21 ENCOUNTER — Other Ambulatory Visit: Payer: Self-pay

## 2023-05-21 ENCOUNTER — Other Ambulatory Visit (HOSPITAL_BASED_OUTPATIENT_CLINIC_OR_DEPARTMENT_OTHER): Payer: Self-pay | Admitting: Family Medicine

## 2023-05-21 ENCOUNTER — Other Ambulatory Visit (INDEPENDENT_AMBULATORY_CARE_PROVIDER_SITE_OTHER): Payer: Self-pay | Admitting: Family Medicine

## 2023-05-21 DIAGNOSIS — E559 Vitamin D deficiency, unspecified: Secondary | ICD-10-CM

## 2023-05-21 MED ORDER — HYDROCHLOROTHIAZIDE 25 MG PO TABS
25.0000 mg | ORAL_TABLET | Freq: Every day | ORAL | 0 refills | Status: DC
  Filled 2023-05-21: qty 90, 90d supply, fill #0

## 2023-05-24 ENCOUNTER — Other Ambulatory Visit (HOSPITAL_BASED_OUTPATIENT_CLINIC_OR_DEPARTMENT_OTHER): Payer: Self-pay

## 2023-05-24 ENCOUNTER — Other Ambulatory Visit (HOSPITAL_BASED_OUTPATIENT_CLINIC_OR_DEPARTMENT_OTHER): Payer: Self-pay | Admitting: Family Medicine

## 2023-05-24 DIAGNOSIS — R051 Acute cough: Secondary | ICD-10-CM

## 2023-05-24 MED ORDER — PROMETHAZINE-DM 6.25-15 MG/5ML PO SYRP: 5.0000 mL | ORAL_SOLUTION | Freq: Four times a day (QID) | ORAL | 0 refills | Status: AC | PRN

## 2023-06-01 ENCOUNTER — Other Ambulatory Visit (HOSPITAL_BASED_OUTPATIENT_CLINIC_OR_DEPARTMENT_OTHER): Payer: Self-pay

## 2023-06-05 ENCOUNTER — Encounter (INDEPENDENT_AMBULATORY_CARE_PROVIDER_SITE_OTHER): Payer: Self-pay | Admitting: Family Medicine

## 2023-06-05 ENCOUNTER — Ambulatory Visit (INDEPENDENT_AMBULATORY_CARE_PROVIDER_SITE_OTHER): Payer: BC Managed Care – PPO | Admitting: Family Medicine

## 2023-06-05 ENCOUNTER — Other Ambulatory Visit (HOSPITAL_BASED_OUTPATIENT_CLINIC_OR_DEPARTMENT_OTHER): Payer: Self-pay

## 2023-06-05 VITALS — BP 123/75 | HR 96 | Temp 97.6°F | Ht 62.0 in | Wt 269.0 lb

## 2023-06-05 DIAGNOSIS — Z7189 Other specified counseling: Secondary | ICD-10-CM

## 2023-06-05 DIAGNOSIS — U071 COVID-19: Secondary | ICD-10-CM | POA: Diagnosis not present

## 2023-06-05 DIAGNOSIS — E669 Obesity, unspecified: Secondary | ICD-10-CM | POA: Diagnosis not present

## 2023-06-05 DIAGNOSIS — E559 Vitamin D deficiency, unspecified: Secondary | ICD-10-CM | POA: Diagnosis not present

## 2023-06-05 DIAGNOSIS — F39 Unspecified mood [affective] disorder: Secondary | ICD-10-CM

## 2023-06-05 DIAGNOSIS — R632 Polyphagia: Secondary | ICD-10-CM

## 2023-06-05 DIAGNOSIS — Z6841 Body Mass Index (BMI) 40.0 and over, adult: Secondary | ICD-10-CM

## 2023-06-05 MED ORDER — BUPROPION HCL ER (SR) 200 MG PO TB12
200.0000 mg | ORAL_TABLET | Freq: Two times a day (BID) | ORAL | 0 refills | Status: DC
  Filled 2023-06-05 – 2023-06-27 (×3): qty 60, 30d supply, fill #0

## 2023-06-05 MED ORDER — VITAMIN D (ERGOCALCIFEROL) 1.25 MG (50000 UNIT) PO CAPS
50000.0000 [IU] | ORAL_CAPSULE | ORAL | 0 refills | Status: DC
  Filled 2023-06-05 – 2023-06-27 (×3): qty 4, 28d supply, fill #0

## 2023-06-05 MED ORDER — ZEPBOUND 5 MG/0.5ML ~~LOC~~ SOAJ
5.0000 mg | SUBCUTANEOUS | 0 refills | Status: DC
  Filled 2023-06-05 – 2023-06-10 (×2): qty 2, 28d supply, fill #0

## 2023-06-05 NOTE — Progress Notes (Signed)
 .smr  Office: [phone]  /  Fax: [phone]  WEIGHT SUMMARY AND BIOMETRICS  Anthropometric Measurements Height: 5' 2 (1.575 m) Weight: 269 lb (122 kg) BMI (Calculated): 49.19 Weight at Last Visit: 268 lb Weight Lost Since Last Visit: 0 Weight Gained Since Last Visit: 1 lb Starting Weight: 311 lb Total Weight Loss (lbs): 42 lb (19.1 kg) Peak Weight: 311 lb   Body Composition  Body Fat %: 51.6 % Fat Mass (lbs): 139.2 lbs Muscle Mass (lbs): 124 lbs Total Body Water (lbs): 93.8 lbs Visceral Fat Rating : 18   Other Clinical Data Fasting: No Labs: No Today's Visit #: 35 Starting Date: 06/15/21    Chief Complaint: OBESITY   History of Present Illness   Tara Kelley is a 43 year old female who presents for management of obesity, vitamin D  deficiency, emotional eating behaviors, and polyphasia.  She is managing obesity and polyphasia with Zepbound , 5 mg weekly, along with dietary modifications and exercise. Despite these efforts, she has gained one pound in the last month. She journals about 25% of the time and has recently started exercising, though not regularly yet. She is working on a routine of alternating walking and strength training, aiming for daily exercise with a minimum of three times a week. Her work hours have been adjusted to allow for morning exercise.  She is on prescription vitamin D , 50,000 IU weekly, and her last vitamin D  level was nearly at goal.  She is taking Wellbutrin  for emotional eating behaviors and reports stability with this medication, requesting a refill.  She recently experienced a severe COVID-19 infection, more intense than her previous one, with significant fatigue, a thick cough, and altered taste and smell, the latter not fully recovered. She also had a cough severe enough to cause joint pain and a burst blood vessel in her eye. Prior to this, she was treated with Augmentin  for seven days, followed by a Z-Pak, which led to some  improvement.  She is trying to increase her protein intake, especially in the morning, and has been hydrating more.          PHYSICAL EXAM:  Blood pressure 123/75, pulse 96, temperature 97.6 F (36.4 C), height 5' 2 (1.575 m), weight 269 lb (122 kg), SpO2 98%. Body mass index is 49.2 kg/m.  DIAGNOSTIC DATA REVIEWED:  BMET    Component Value Date/Time   NA 140 10/12/2022 0828   K 4.3 10/12/2022 0828   CL 101 10/12/2022 0828   CO2 23 10/12/2022 0828   GLUCOSE 79 10/12/2022 0828   GLUCOSE 81 04/20/2022 0901   BUN 10 10/12/2022 0828   CREATININE 0.97 10/12/2022 0828   CALCIUM  9.3 10/12/2022 0828   GFRNONAA >60 04/20/2022 0901   GFRAA >90 07/20/2014 2128   Lab Results  Component Value Date   HGBA1C 5.2 10/12/2022   HGBA1C 5.4 06/15/2021   Lab Results  Component Value Date   INSULIN  20.3 10/12/2022   INSULIN  20.6 06/15/2021   Lab Results  Component Value Date   TSH 1.610 02/20/2022   CBC    Component Value Date/Time   WBC 9.3 04/20/2022 0901   RBC 4.68 04/20/2022 0901   HGB 14.5 04/20/2022 0901   HGB 14.3 06/15/2021 1036   HCT 41.8 04/20/2022 0901   HCT 43.7 06/15/2021 1036   PLT 326 04/20/2022 0901   PLT 332 06/15/2021 1036   MCV 89.3 04/20/2022 0901   MCV 92 06/15/2021 1036   MCH 31.0 04/20/2022 0901  MCHC 34.7 04/20/2022 0901   RDW 12.3 04/20/2022 0901   RDW 12.6 06/15/2021 1036   Iron Studies No results found for: IRON, TIBC, FERRITIN, IRONPCTSAT Lipid Panel     Component Value Date/Time   CHOL 207 (H) 10/12/2022 0828   TRIG 110 10/12/2022 0828   HDL 48 10/12/2022 0828   CHOLHDL 4.4 06/15/2021 1036   LDLCALC 139 (H) 10/12/2022 0828   Hepatic Function Panel     Component Value Date/Time   PROT 6.7 10/12/2022 0828   ALBUMIN 4.1 10/12/2022 0828   AST 18 10/12/2022 0828   ALT 17 10/12/2022 0828   ALKPHOS 116 10/12/2022 0828   BILITOT 0.3 10/12/2022 0828      Component Value Date/Time   TSH 1.610 02/20/2022 0742    Nutritional Lab Results  Component Value Date   VD25OH 42.1 10/12/2022   VD25OH 39.7 02/20/2022   VD25OH 42.3 12/21/2021     Assessment and Plan    Obesity Obesity with recent weight gain of one pound in the last month. Managed with Zepbound  5 mg weekly. Patient is working on diet, exercise, and weight loss. Recently started exercising again but not regularly yet. Discussed the importance of regular exercise, balanced diet, and hydration. Patient plans to alternate walking and strength training daily, aiming for at least three times a week. Journaling food intake about 25% of the time. - Continue Zepbound  5 mg weekly - Encourage regular exercise, aiming for at least three times a week - Encourage journaling food intake - Discuss the importance of a balanced diet and hydration  Polyphagia Polyphagia managed with Zepbound  5 mg weekly. Patient is also working on diet and exercise. Discussed the importance of regular exercise and a balanced diet to manage symptoms. - Continue Zepbound  5 mg weekly - Encourage regular exercise and balanced diet  Emotional Eating Behaviors Emotional eating behaviors managed with Wellbutrin . Patient reports stability and requests a refill. Discussed the importance of continued medication adherence and monitoring for any changes in behavior. - Refill Wellbutrin   Vitamin D  Deficiency Vitamin D  deficiency, currently managed with prescription vitamin D  50,000 IU weekly. Last vitamin D  level was almost at goal. Discussed the importance of regular monitoring and maintaining adequate levels. - Continue vitamin D  50,000 IU weekly - Monitor vitamin D  levels regularly  COVID-19 Recent COVID-19 infection with severe symptoms including fatigue, thick cough, and altered taste and smell. Symptoms have improved but not fully resolved. Discussed the importance of getting a COVID-19 booster as soon as possible and planning for annual COVID-19 and flu vaccinations in the  fall. Explained that current immunity to this strain is good but vaccination is still recommended. - Recommend COVID-19 booster as soon as possible - Plan for annual COVID-19 and flu vaccinations in the fall  General Health Maintenance Patient received a flu shot recently. Discussed the importance of regular vaccinations and maintaining a healthy lifestyle. Encouraged continued use of additional vitamins (C, D, B12, zinc) for immune support. - Recommend COVID-19 booster as soon as possible - Plan for annual COVID-19 and flu vaccinations in the fall - Encourage a balanced diet and regular exercise - Continue taking additional vitamins (C, D, B12, zinc) for immune support  Follow-up - Schedule follow-up appointment for April.        She was informed of the importance of frequent follow up visits to maximize her success with intensive lifestyle modifications for her multiple health conditions.    Louann Penton, MD

## 2023-06-10 ENCOUNTER — Other Ambulatory Visit (INDEPENDENT_AMBULATORY_CARE_PROVIDER_SITE_OTHER): Payer: Self-pay | Admitting: Family Medicine

## 2023-06-10 ENCOUNTER — Other Ambulatory Visit (HOSPITAL_BASED_OUTPATIENT_CLINIC_OR_DEPARTMENT_OTHER): Payer: Self-pay | Admitting: Family Medicine

## 2023-06-10 DIAGNOSIS — K219 Gastro-esophageal reflux disease without esophagitis: Secondary | ICD-10-CM

## 2023-06-11 ENCOUNTER — Other Ambulatory Visit: Payer: Self-pay

## 2023-06-11 ENCOUNTER — Other Ambulatory Visit (HOSPITAL_BASED_OUTPATIENT_CLINIC_OR_DEPARTMENT_OTHER): Payer: Self-pay

## 2023-06-11 MED ORDER — HYDROCHLOROTHIAZIDE 25 MG PO TABS
25.0000 mg | ORAL_TABLET | Freq: Every day | ORAL | 3 refills | Status: AC
  Filled 2023-06-11 – 2023-08-23 (×2): qty 90, 90d supply, fill #0
  Filled 2023-10-08 – 2023-11-20 (×2): qty 90, 90d supply, fill #1
  Filled 2024-01-17 – 2024-03-02 (×2): qty 90, 90d supply, fill #2
  Filled 2024-04-07 – 2024-04-10 (×2): qty 90, 90d supply, fill #3

## 2023-06-12 ENCOUNTER — Other Ambulatory Visit (HOSPITAL_BASED_OUTPATIENT_CLINIC_OR_DEPARTMENT_OTHER): Payer: Self-pay

## 2023-06-13 ENCOUNTER — Other Ambulatory Visit (HOSPITAL_BASED_OUTPATIENT_CLINIC_OR_DEPARTMENT_OTHER): Payer: Self-pay

## 2023-06-13 ENCOUNTER — Encounter (HOSPITAL_BASED_OUTPATIENT_CLINIC_OR_DEPARTMENT_OTHER): Payer: Self-pay

## 2023-06-27 ENCOUNTER — Other Ambulatory Visit (HOSPITAL_BASED_OUTPATIENT_CLINIC_OR_DEPARTMENT_OTHER): Payer: Self-pay

## 2023-06-27 ENCOUNTER — Other Ambulatory Visit (INDEPENDENT_AMBULATORY_CARE_PROVIDER_SITE_OTHER): Payer: Self-pay | Admitting: Family Medicine

## 2023-06-27 ENCOUNTER — Other Ambulatory Visit: Payer: Self-pay

## 2023-06-27 DIAGNOSIS — E669 Obesity, unspecified: Secondary | ICD-10-CM

## 2023-06-27 DIAGNOSIS — R632 Polyphagia: Secondary | ICD-10-CM

## 2023-06-27 DIAGNOSIS — K219 Gastro-esophageal reflux disease without esophagitis: Secondary | ICD-10-CM

## 2023-06-30 ENCOUNTER — Encounter (HOSPITAL_BASED_OUTPATIENT_CLINIC_OR_DEPARTMENT_OTHER): Payer: Self-pay

## 2023-06-30 ENCOUNTER — Other Ambulatory Visit (HOSPITAL_BASED_OUTPATIENT_CLINIC_OR_DEPARTMENT_OTHER): Payer: Self-pay

## 2023-07-02 ENCOUNTER — Other Ambulatory Visit (INDEPENDENT_AMBULATORY_CARE_PROVIDER_SITE_OTHER): Payer: Self-pay | Admitting: Family Medicine

## 2023-07-02 DIAGNOSIS — K219 Gastro-esophageal reflux disease without esophagitis: Secondary | ICD-10-CM

## 2023-07-03 ENCOUNTER — Ambulatory Visit (INDEPENDENT_AMBULATORY_CARE_PROVIDER_SITE_OTHER): Payer: BC Managed Care – PPO | Admitting: Family Medicine

## 2023-07-03 ENCOUNTER — Encounter (INDEPENDENT_AMBULATORY_CARE_PROVIDER_SITE_OTHER): Payer: Self-pay | Admitting: Family Medicine

## 2023-07-03 ENCOUNTER — Other Ambulatory Visit (HOSPITAL_BASED_OUTPATIENT_CLINIC_OR_DEPARTMENT_OTHER): Payer: Self-pay

## 2023-07-03 ENCOUNTER — Encounter (HOSPITAL_BASED_OUTPATIENT_CLINIC_OR_DEPARTMENT_OTHER): Payer: Self-pay

## 2023-07-03 ENCOUNTER — Other Ambulatory Visit: Payer: Self-pay

## 2023-07-03 VITALS — BP 124/80 | HR 95 | Temp 97.9°F | Ht 62.0 in | Wt 270.0 lb

## 2023-07-03 DIAGNOSIS — E559 Vitamin D deficiency, unspecified: Secondary | ICD-10-CM | POA: Diagnosis not present

## 2023-07-03 DIAGNOSIS — I10 Essential (primary) hypertension: Secondary | ICD-10-CM

## 2023-07-03 DIAGNOSIS — E669 Obesity, unspecified: Secondary | ICD-10-CM

## 2023-07-03 DIAGNOSIS — K219 Gastro-esophageal reflux disease without esophagitis: Secondary | ICD-10-CM | POA: Diagnosis not present

## 2023-07-03 DIAGNOSIS — Z6841 Body Mass Index (BMI) 40.0 and over, adult: Secondary | ICD-10-CM

## 2023-07-03 DIAGNOSIS — R632 Polyphagia: Secondary | ICD-10-CM | POA: Diagnosis not present

## 2023-07-03 MED ORDER — ZEPBOUND 7.5 MG/0.5ML ~~LOC~~ SOAJ
7.5000 mg | SUBCUTANEOUS | 0 refills | Status: DC
  Filled 2023-07-03 – 2023-07-19 (×2): qty 2, 28d supply, fill #0

## 2023-07-03 MED ORDER — OMEPRAZOLE 20 MG PO CPDR
20.0000 mg | DELAYED_RELEASE_CAPSULE | Freq: Every day | ORAL | 0 refills | Status: DC
  Filled 2023-07-03 – 2023-07-19 (×2): qty 90, 90d supply, fill #0

## 2023-07-03 MED ORDER — VITAMIN D (ERGOCALCIFEROL) 1.25 MG (50000 UNIT) PO CAPS
50000.0000 [IU] | ORAL_CAPSULE | ORAL | 0 refills | Status: DC
  Filled 2023-07-03: qty 12, 84d supply, fill #0

## 2023-07-03 NOTE — Progress Notes (Signed)
 Office: [phone]  /  Fax: [phone]  WEIGHT SUMMARY AND BIOMETRICS  Anthropometric Measurements Height: 5\' 2"  (1.575 m) Weight: 270 lb (122.5 kg) BMI (Calculated): 49.37 Weight at Last Visit: 269 lb Weight Lost Since Last Visit: 0 lb Weight Gained Since Last Visit: 1 lb Starting Weight: 311 lb Total Weight Loss (lbs): 41 lb (18.6 kg) Peak Weight: 311 lb   Body Composition  Body Fat %: 51.9 % Fat Mass (lbs): 140.4 lbs Muscle Mass (lbs): 123.4 lbs Total Body Water (lbs): 92.6 lbs Visceral Fat Rating : 18   Other Clinical Data Fasting: No Labs: No Today's Visit #: 36 Starting Date: 06/15/21    Chief Complaint: OBESITY   History of Present Illness   Tara Kelley is a 43 year old female with obesity who presents for treatment plan and progress monitoring.  She has gained one pound over the last month despite engaging in a combination of walking and strength exercises for forty-five minutes, four days per week. She is journaling about seventy-five percent of the time and is disappointed with her progress.  She is on Zepbound 5 mg weekly for polyphasia. The medication is not having the same effect as before, and she feels an increase in hunger levels, wondering if she has acclimated to the dosage.  She faces challenges with meal preparation since becoming single, often not wanting to cook for herself and discarding food. She is trying to increase her protein intake through dairy products like yogurt and cottage cheese but is eating less meat, particularly chicken, due to lack of motivation to prepare it.  She has a history of vitamin D deficiency and is on prescription vitamin D, 50,000 international units weekly. Her last vitamin D level was 42 in June of the previous year, and she is due for a recheck.  She has a history of hypertension, which is currently well-controlled with a blood pressure reading of 124/80. She is taking hydrochlorothiazide 25 mg and  Cozaar 100 mg daily. She is also working on diet, exercise, and weight loss to aid in managing her hypertension.          PHYSICAL EXAM:  Blood pressure 124/80, pulse 95, temperature 97.9 F (36.6 C), height 5\' 2"  (1.575 m), weight 270 lb (122.5 kg), last menstrual period 06/26/2023, SpO2 98%. Body mass index is 49.38 kg/m.  DIAGNOSTIC DATA REVIEWED:  BMET    Component Value Date/Time   NA 140 10/12/2022 0828   K 4.3 10/12/2022 0828   CL 101 10/12/2022 0828   CO2 23 10/12/2022 0828   GLUCOSE 79 10/12/2022 0828   GLUCOSE 81 04/20/2022 0901   BUN 10 10/12/2022 0828   CREATININE 0.97 10/12/2022 0828   CALCIUM 9.3 10/12/2022 0828   GFRNONAA >60 04/20/2022 0901   GFRAA >90 07/20/2014 2128   Lab Results  Component Value Date   HGBA1C 5.2 10/12/2022   HGBA1C 5.4 06/15/2021   Lab Results  Component Value Date   INSULIN 20.3 10/12/2022   INSULIN 20.6 06/15/2021   Lab Results  Component Value Date   TSH 1.610 02/20/2022   CBC    Component Value Date/Time   WBC 9.3 04/20/2022 0901   RBC 4.68 04/20/2022 0901   HGB 14.5 04/20/2022 0901   HGB 14.3 06/15/2021 1036   HCT 41.8 04/20/2022 0901   HCT 43.7 06/15/2021 1036   PLT 326 04/20/2022 0901   PLT 332 06/15/2021 1036   MCV 89.3 04/20/2022 0901   MCV 92 06/15/2021 1036  MCH 31.0 04/20/2022 0901   MCHC 34.7 04/20/2022 0901   RDW 12.3 04/20/2022 0901   RDW 12.6 06/15/2021 1036   Iron Studies No results found for: "IRON", "TIBC", "FERRITIN", "IRONPCTSAT" Lipid Panel     Component Value Date/Time   CHOL 207 (H) 10/12/2022 0828   TRIG 110 10/12/2022 0828   HDL 48 10/12/2022 0828   CHOLHDL 4.4 06/15/2021 1036   LDLCALC 139 (H) 10/12/2022 0828   Hepatic Function Panel     Component Value Date/Time   PROT 6.7 10/12/2022 0828   ALBUMIN 4.1 10/12/2022 0828   AST 18 10/12/2022 0828   ALT 17 10/12/2022 0828   ALKPHOS 116 10/12/2022 0828   BILITOT 0.3 10/12/2022 0828      Component Value Date/Time   TSH 1.610  02/20/2022 0742   Nutritional Lab Results  Component Value Date   VD25OH 42.1 10/12/2022   VD25OH 39.7 02/20/2022   VD25OH 42.3 12/21/2021     Assessment and Plan    Obesity and Polyphagia Patient has gained one pound in the last month despite consistent exercise. Reports current medication for polyphasia may be less effective. Discussed challenges with meal preparation and protein intake. Consider increasing Zepbound dose to 7.5 mg weekly to improve efficacy. Emphasized not skipping meals and finding convenient protein sources. - Increase Zepbound dose from 5 mg to 7.5 mg weekly - Encourage continued exercise and dietary efforts - Discuss frozen food options to increase protein intake - Reinforce importance of not skipping meals - Set protein goal at 100 grams per day and calorie goal at 1700 calories per day  Hypertension Hypertension is well-controlled with current medications. Blood pressure today is 124/80 mmHg. Current sodium intake from convenience foods is not a concern given well-controlled blood pressure. - Continue hydrochlorothiazide 25 mg daily - Continue Cozaar 100 mg daily - Monitor blood pressure regularly  Gastroesophageal Reflux Disease (GERD) Patient reports difficulty getting omeprazole (Prilosec) filled due to insurance issues. Previous months' costs were manageable with coupons, but recent out-of-pocket costs have increased significantly. Discussed using a 90-day supply or switching to an online pharmacy to reduce costs. Advised to download a new coupon and send non-urgent messages for future refill requests. - Refill omeprazole (Prilosec) - Advise patient to download a new coupon for omeprazole - Consider 90-day supply if cost-effective - Instruct patient to send non-urgent message for future refill requests  Vitamin D Deficiency Last vitamin D level was 42 in June of last year. Due for recheck. - Refill prescription vitamin D 50,000 IU weekly - Order  vitamin D level recheck  General Health Maintenance None - Encourage use of frozen foods to meet dietary goals - Encourage use of steamer bags for vegetables and raw vegetable snacks  Follow-up - Schedule next appointment for April 1st - Schedule additional follow-up appointment in May.        She was informed of the importance of frequent follow up visits to maximize her success with intensive lifestyle modifications for her multiple health conditions.    Quillian Quince, MD

## 2023-07-13 ENCOUNTER — Other Ambulatory Visit (HOSPITAL_BASED_OUTPATIENT_CLINIC_OR_DEPARTMENT_OTHER): Payer: Self-pay

## 2023-07-19 ENCOUNTER — Other Ambulatory Visit (HOSPITAL_BASED_OUTPATIENT_CLINIC_OR_DEPARTMENT_OTHER): Payer: Self-pay

## 2023-07-22 ENCOUNTER — Emergency Department (HOSPITAL_BASED_OUTPATIENT_CLINIC_OR_DEPARTMENT_OTHER)
Admission: EM | Admit: 2023-07-22 | Discharge: 2023-07-22 | Disposition: A | Discharge status: Home / Self Care | Attending: Emergency Medicine | Admitting: Emergency Medicine

## 2023-07-22 ENCOUNTER — Encounter (HOSPITAL_BASED_OUTPATIENT_CLINIC_OR_DEPARTMENT_OTHER): Payer: Self-pay

## 2023-07-22 ENCOUNTER — Encounter (INDEPENDENT_AMBULATORY_CARE_PROVIDER_SITE_OTHER): Payer: Self-pay | Admitting: Family Medicine

## 2023-07-22 ENCOUNTER — Other Ambulatory Visit: Payer: Self-pay

## 2023-07-22 DIAGNOSIS — Z79899 Other long term (current) drug therapy: Secondary | ICD-10-CM | POA: Diagnosis not present

## 2023-07-22 DIAGNOSIS — R1013 Epigastric pain: Secondary | ICD-10-CM | POA: Insufficient documentation

## 2023-07-22 DIAGNOSIS — Z87891 Personal history of nicotine dependence: Secondary | ICD-10-CM | POA: Diagnosis not present

## 2023-07-22 DIAGNOSIS — I10 Essential (primary) hypertension: Secondary | ICD-10-CM | POA: Insufficient documentation

## 2023-07-22 DIAGNOSIS — R112 Nausea with vomiting, unspecified: Secondary | ICD-10-CM | POA: Insufficient documentation

## 2023-07-22 LAB — COMPREHENSIVE METABOLIC PANEL
ALT: 17 U/L (ref 0–44)
AST: 17 U/L (ref 15–41)
Albumin: 4.1 g/dL (ref 3.5–5.0)
Alkaline Phosphatase: 89 U/L (ref 38–126)
Anion gap: 8 (ref 5–15)
BUN: 15 mg/dL (ref 6–20)
CO2: 27 mmol/L (ref 22–32)
Calcium: 9 mg/dL (ref 8.9–10.3)
Chloride: 100 mmol/L (ref 98–111)
Creatinine, Ser: 0.82 mg/dL (ref 0.44–1.00)
GFR, Estimated: >60 mL/min (ref >60)
Glucose, Bld: 103 mg/dL — ABNORMAL HIGH (ref 70–99)
Potassium: 4 mmol/L (ref 3.5–5.1)
Sodium: 135 mmol/L (ref 135–145)
Total Bilirubin: 0.6 mg/dL (ref 0.0–1.2)
Total Protein: 7.1 g/dL (ref 6.5–8.1)

## 2023-07-22 LAB — MAGNESIUM: Magnesium: 1.9 mg/dL (ref 1.7–2.4)

## 2023-07-22 LAB — LIPASE, BLOOD: Lipase: 30 U/L (ref 11–51)

## 2023-07-22 LAB — CBC WITH DIFFERENTIAL/PLATELET
Abs Immature Granulocytes: 0.08 10*3/uL — ABNORMAL HIGH (ref 0.00–0.07)
Basophils Absolute: 0 10*3/uL (ref 0.0–0.1)
Basophils Relative: 0 %
Eosinophils Absolute: 0.2 10*3/uL (ref 0.0–0.5)
Eosinophils Relative: 2 %
HCT: 40.1 % (ref 36.0–46.0)
Hemoglobin: 14 g/dL (ref 12.0–15.0)
Immature Granulocytes: 1 %
Lymphocytes Relative: 12 %
Lymphs Abs: 1.5 10*3/uL (ref 0.7–4.0)
MCH: 30.9 pg (ref 26.0–34.0)
MCHC: 34.9 g/dL (ref 30.0–36.0)
MCV: 88.5 fL (ref 80.0–100.0)
Monocytes Absolute: 0.6 10*3/uL (ref 0.1–1.0)
Monocytes Relative: 5 %
Neutro Abs: 10.6 10*3/uL — ABNORMAL HIGH (ref 1.7–7.7)
Neutrophils Relative %: 80 %
Platelets: 354 10*3/uL (ref 150–400)
RBC: 4.53 MIL/uL (ref 3.87–5.11)
RDW: 12.8 % (ref 11.5–15.5)
WBC: 13 10*3/uL — ABNORMAL HIGH (ref 4.0–10.5)
nRBC: 0 % (ref 0.0–0.2)

## 2023-07-22 LAB — RESP PANEL BY RT-PCR (RSV, FLU A&B, COVID)  RVPGX2
Influenza A by PCR: NEGATIVE
Influenza B by PCR: NEGATIVE
Resp Syncytial Virus by PCR: NEGATIVE
SARS Coronavirus 2 by RT PCR: NEGATIVE

## 2023-07-22 MED ORDER — DIPHENHYDRAMINE HCL 50 MG/ML IJ SOLN
25.0000 mg | Freq: Once | INTRAMUSCULAR | Status: AC
  Administered 2023-07-22: 25 mg via INTRAVENOUS
  Filled 2023-07-22: qty 1

## 2023-07-22 MED ORDER — PROCHLORPERAZINE MALEATE 5 MG PO TABS: 5.0000 mg | ORAL_TABLET | Freq: Two times a day (BID) | ORAL | 0 refills | Status: AC | PRN

## 2023-07-22 MED ORDER — SODIUM CHLORIDE 0.9 % IV BOLUS
1000.0000 mL | Freq: Once | INTRAVENOUS | Status: AC
  Administered 2023-07-22: 1000 mL via INTRAVENOUS

## 2023-07-22 MED ORDER — PROCHLORPERAZINE EDISYLATE 10 MG/2ML IJ SOLN
5.0000 mg | Freq: Once | INTRAMUSCULAR | Status: AC
  Administered 2023-07-22: 5 mg via INTRAVENOUS
  Filled 2023-07-22: qty 2

## 2023-07-22 MED ORDER — SUCRALFATE 1 G PO TABS: 1.0000 g | ORAL_TABLET | Freq: Three times a day (TID) | ORAL | 0 refills | Status: AC

## 2023-07-22 MED ORDER — FAMOTIDINE IN NACL 20-0.9 MG/50ML-% IV SOLN
20.0000 mg | Freq: Once | INTRAVENOUS | Status: AC
  Administered 2023-07-22: 20 mg via INTRAVENOUS
  Filled 2023-07-22: qty 50

## 2023-07-22 NOTE — ED Triage Notes (Signed)
 Pt presents via POV c/o "feeling sick". Reports generalized body aches. Pt associates symptoms with GLP medication dosage. Reports N/V.
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
## 2023-07-22 NOTE — Discharge Instructions (Addendum)
 It was a pleasure caring for you today in the emergency department.  Drink small sips of liquid throughout the day, eat a very bland diet.  Recommend follow-up with your PCP for further evaluation.  Likely need to reduce dosage of your Zepbound.  Your labs today look okay  Please return to the emergency department for any worsening or worrisome symptoms.

## 2023-07-22 NOTE — ED Notes (Signed)
 ED Provider at bedside.

## 2023-07-24 ENCOUNTER — Other Ambulatory Visit (HOSPITAL_BASED_OUTPATIENT_CLINIC_OR_DEPARTMENT_OTHER): Payer: Self-pay

## 2023-07-24 ENCOUNTER — Ambulatory Visit (INDEPENDENT_AMBULATORY_CARE_PROVIDER_SITE_OTHER): Payer: BC Managed Care – PPO | Admitting: Dermatology

## 2023-07-24 ENCOUNTER — Encounter: Payer: Self-pay | Admitting: Dermatology

## 2023-07-24 ENCOUNTER — Other Ambulatory Visit (INDEPENDENT_AMBULATORY_CARE_PROVIDER_SITE_OTHER): Payer: Self-pay | Admitting: Family Medicine

## 2023-07-24 VITALS — BP 175/118

## 2023-07-24 DIAGNOSIS — R21 Rash and other nonspecific skin eruption: Secondary | ICD-10-CM

## 2023-07-24 DIAGNOSIS — L900 Lichen sclerosus et atrophicus: Secondary | ICD-10-CM | POA: Diagnosis not present

## 2023-07-24 MED ORDER — TIRZEPATIDE-WEIGHT MANAGEMENT 5 MG/0.5ML ~~LOC~~ SOAJ
5.0000 mg | SUBCUTANEOUS | 0 refills | Status: DC
  Filled 2023-07-24: qty 2, 28d supply, fill #0

## 2023-07-24 NOTE — Patient Instructions (Addendum)

## 2023-07-24 NOTE — Progress Notes (Signed)
 New Patient Visit   Subjective  Tara Kelley is a 43 y.o. female who presents for the following: New Pt - Lichen Sclerosis   Patient states she has Lichen Sclerosis dx in 2023 located at the abdomen and groin that she would like to have examined. Patient reports the areas have been there for over 1 year. She reports the areas are not bothersome.Patient rates irritation 0 out of 10. She states that the areas have spread. Patient reports she has previously been treated for these areas. Rx Tacrolimus 0.1%, Mupiricin, Clobetasol 0.05% & TMC 0.1% but she is not currently using anything on the areas. Patient reports Hx of bx. Patient denied family history of skin cancer(s).   The following portions of the chart were reviewed this encounter and updated as appropriate: medications, allergies, medical history  Review of Systems:  No other skin or systemic complaints except as noted in HPI or Assessment and Plan.  Objective  Well appearing patient in no apparent distress; mood and affect are within normal limits.   A focused examination was performed of the following areas: groin & abdomen   Relevant exam findings are noted in the Assessment and Plan.        Assessment & Plan   1. Rash (suspected morphea vs. lichen sclerosus) - Assessment: Pink atrophic plaques with dermal atrophy on the abdomen and vaginal area. Initially diagnosed as lichen sclerosus by a gynecologist, then as eczema by another dermatologist. Dr. Onalee Hua suspects morphea, but considers lichen sclerosus atrophicus and other autoimmune issues as differential diagnoses. Condition has spread from vaginal area to abdomen, with one suspicious spot on the back. - Plan:    Perform 4-millimeter punch biopsy of plaque on right abdomen    Obtain photographs of affected areas    Order labs: ANA with reflex titers, anti-Ro, anti-La, CBC, CMP, and CRP    Prescribe tacrolimus for twice daily application on all affected areas    For  vaginal area: alternate 2 weeks clobetasol and 2 weeks tacrolimus    Schedule 2-week follow-up for suture removal, lab results review, and pathology report discussion    Provide biopsy site care instructions at next visit  2. Hair thinning and loss - Assessment: Patient reports concerns about hair thinning and loss. Issue not fully addressed during current visit due to prioritization of skin condition. - Plan: Schedule separate visit to address hair concerns   RASH Right Abdomen (side) - Lower Skin / nail biopsy - Right Abdomen (side) - Lower Type of biopsy: punch   Informed consent: discussed and consent obtained   Timeout: patient name, date of birth, surgical site, and procedure verified   Procedure prep:  Patient was prepped and draped in usual sterile fashion Prep type:  Isopropyl alcohol Anesthesia: the lesion was anesthetized in a standard fashion   Anesthetic:  1% lidocaine w/ epinephrine 1-100,000 buffered w/ 8.4% NaHCO3 Punch size:  4 mm Suture size:  4-0 Suture type: Prolene (polypropylene)   Hemostasis achieved with: suture and aluminum chloride   Outcome: patient tolerated procedure well   Post-procedure details: sterile dressing applied and wound care instructions given   Dressing type: petrolatum gauze   Specimen 1 - Surgical pathology Differential Diagnosis: r/o lichen sclerosis vs morphea  Check Margins: yes  Return in about 2 weeks (around 08/07/2023) for 2 weeks follow up.    Documentation: I have reviewed the above documentation for accuracy and completeness, and I agree with the above.   I, Shirron Verdigris,  CMA, am acting as scribe for Langston Reusing, DO.   Langston Reusing, DO

## 2023-07-25 ENCOUNTER — Other Ambulatory Visit (HOSPITAL_BASED_OUTPATIENT_CLINIC_OR_DEPARTMENT_OTHER): Payer: Self-pay

## 2023-07-25 DIAGNOSIS — R21 Rash and other nonspecific skin eruption: Secondary | ICD-10-CM | POA: Diagnosis not present

## 2023-07-25 LAB — SURGICAL PATHOLOGY

## 2023-07-26 LAB — COMPREHENSIVE METABOLIC PANEL WITH GFR
ALT: 15 IU/L (ref 0–32)
AST: 17 IU/L (ref 0–40)
Albumin: 4.2 g/dL (ref 3.9–4.9)
Alkaline Phosphatase: 117 IU/L (ref 44–121)
BUN/Creatinine Ratio: 15 (ref 9–23)
BUN: 12 mg/dL (ref 6–24)
Bilirubin Total: 0.3 mg/dL (ref 0.0–1.2)
CO2: 23 mmol/L (ref 20–29)
Calcium: 9.2 mg/dL (ref 8.7–10.2)
Chloride: 98 mmol/L (ref 96–106)
Creatinine, Ser: 0.79 mg/dL (ref 0.57–1.00)
Globulin, Total: 2.8 g/dL (ref 1.5–4.5)
Glucose: 80 mg/dL (ref 70–99)
Potassium: 3.9 mmol/L (ref 3.5–5.2)
Sodium: 137 mmol/L (ref 134–144)
Total Protein: 7 g/dL (ref 6.0–8.5)
eGFR: 96 mL/min/{1.73_m2} (ref >59)

## 2023-07-26 LAB — SJOGREN'S SYNDROME ANTIBODS(SSA + SSB)
ENA SSA (RO) Ab: <0.2 AI (ref 0.0–0.9)
ENA SSB (LA) Ab: <0.2 AI (ref 0.0–0.9)

## 2023-07-26 LAB — CBC WITH DIFFERENTIAL/PLATELET
Basophils Absolute: 0.1 10*3/uL (ref 0.0–0.2)
Basos: 1 %
EOS (ABSOLUTE): 0.5 10*3/uL — ABNORMAL HIGH (ref 0.0–0.4)
Eos: 5 %
Hematocrit: 44.6 % (ref 34.0–46.6)
Hemoglobin: 14.5 g/dL (ref 11.1–15.9)
Immature Grans (Abs): 0.1 10*3/uL (ref 0.0–0.1)
Immature Granulocytes: 1 %
Lymphocytes Absolute: 2.5 10*3/uL (ref 0.7–3.1)
Lymphs: 27 %
MCH: 29.9 pg (ref 26.6–33.0)
MCHC: 32.5 g/dL (ref 31.5–35.7)
MCV: 92 fL (ref 79–97)
Monocytes Absolute: 0.6 10*3/uL (ref 0.1–0.9)
Monocytes: 7 %
Neutrophils Absolute: 5.6 10*3/uL (ref 1.4–7.0)
Neutrophils: 59 %
Platelets: 375 10*3/uL (ref 150–450)
RBC: 4.85 x10E6/uL (ref 3.77–5.28)
RDW: 13 % (ref 11.7–15.4)
WBC: 9.3 10*3/uL (ref 3.4–10.8)

## 2023-07-26 LAB — ANA W/RFX TO ALL IF POSITIVE: Anti Nuclear Antibody (ANA): NEGATIVE

## 2023-07-26 LAB — CENTROMERE ANTIBODIES: Centromere Ab Screen: <0.2 AI (ref 0.0–0.9)

## 2023-07-26 LAB — C-REACTIVE PROTEIN: CRP: 23 mg/L — ABNORMAL HIGH (ref 0–10)

## 2023-07-26 LAB — ANTI-SCLERODERMA ANTIBODY: Scleroderma (Scl-70) (ENA) Antibody, IgG: <0.2 AI (ref 0.0–0.9)

## 2023-07-26 LAB — SEDIMENTATION RATE: Sed Rate: 11 mm/h (ref 0–32)

## 2023-07-30 ENCOUNTER — Encounter: Payer: Self-pay | Admitting: Dermatology

## 2023-07-30 ENCOUNTER — Encounter (INDEPENDENT_AMBULATORY_CARE_PROVIDER_SITE_OTHER): Payer: Self-pay

## 2023-07-31 ENCOUNTER — Telehealth (INDEPENDENT_AMBULATORY_CARE_PROVIDER_SITE_OTHER): Payer: Self-pay | Admitting: *Deleted

## 2023-07-31 ENCOUNTER — Encounter (INDEPENDENT_AMBULATORY_CARE_PROVIDER_SITE_OTHER): Payer: Self-pay | Admitting: Family Medicine

## 2023-07-31 ENCOUNTER — Other Ambulatory Visit (HOSPITAL_BASED_OUTPATIENT_CLINIC_OR_DEPARTMENT_OTHER): Payer: Self-pay

## 2023-07-31 ENCOUNTER — Ambulatory Visit (INDEPENDENT_AMBULATORY_CARE_PROVIDER_SITE_OTHER): Payer: BC Managed Care – PPO | Admitting: Family Medicine

## 2023-07-31 VITALS — BP 123/75 | HR 92 | Temp 97.9°F | Ht 62.0 in | Wt 272.0 lb

## 2023-07-31 DIAGNOSIS — E559 Vitamin D deficiency, unspecified: Secondary | ICD-10-CM | POA: Diagnosis not present

## 2023-07-31 DIAGNOSIS — I10 Essential (primary) hypertension: Secondary | ICD-10-CM

## 2023-07-31 DIAGNOSIS — F5089 Other specified eating disorder: Secondary | ICD-10-CM | POA: Diagnosis not present

## 2023-07-31 DIAGNOSIS — E669 Obesity, unspecified: Secondary | ICD-10-CM | POA: Diagnosis not present

## 2023-07-31 DIAGNOSIS — R632 Polyphagia: Secondary | ICD-10-CM

## 2023-07-31 DIAGNOSIS — F39 Unspecified mood [affective] disorder: Secondary | ICD-10-CM

## 2023-07-31 DIAGNOSIS — Z6841 Body Mass Index (BMI) 40.0 and over, adult: Secondary | ICD-10-CM

## 2023-07-31 MED ORDER — TIRZEPATIDE-WEIGHT MANAGEMENT 5 MG/0.5ML ~~LOC~~ SOAJ
5.0000 mg | SUBCUTANEOUS | 0 refills | Status: DC
Start: 2023-07-31 — End: 2023-08-28
  Filled 2023-08-01: qty 2, 28d supply, fill #0

## 2023-07-31 MED ORDER — BUPROPION HCL ER (SR) 200 MG PO TB12
200.0000 mg | ORAL_TABLET | Freq: Two times a day (BID) | ORAL | 0 refills | Status: DC
  Filled 2023-07-31: qty 60, 30d supply, fill #0

## 2023-07-31 MED ORDER — VITAMIN D (ERGOCALCIFEROL) 1.25 MG (50000 UNIT) PO CAPS
50000.0000 [IU] | ORAL_CAPSULE | ORAL | 0 refills | Status: DC
  Filled 2023-07-31: qty 5, 35d supply, fill #0

## 2023-07-31 NOTE — Telephone Encounter (Signed)
 PA RESUBMITTED VIA [employer] WITH PROPER DOCUMENTATION.   Tara Kelley (Key: [id number])  Your information has been sent to [facility].

## 2023-07-31 NOTE — Progress Notes (Signed)
 Office: [phone]  /  Fax: [phone]  WEIGHT SUMMARY AND BIOMETRICS  Anthropometric Measurements Height: 5\' 2"  (1.575 m) Weight: 272 lb (123.4 kg) BMI (Calculated): 49.74 Weight at Last Visit: 270 lb Weight Lost Since Last Visit: 0 Weight Gained Since Last Visit: 2 lb Starting Weight: 311 lb Total Weight Loss (lbs): 40 lb (18.1 kg) Peak Weight: 311 lb   Body Composition  Body Fat %: 51.7 % Fat Mass (lbs): 141 lbs Muscle Mass (lbs): 125 lbs Total Body Water (lbs): 93.4 lbs Visceral Fat Rating : 18   Other Clinical Data Fasting: No Labs: No Today's Visit #: 37 Starting Date: 06/15/21    Chief Complaint: OBESITY    History of Present Illness   Tara Kelley is a 43 year old female who presents for a follow-up on her obesity treatment plan.  She has been attempting to maintain a food journal with a goal of 1700 calories and 100 grams of protein daily, achieving this about 50% of the time. She is not currently exercising and has gained two pounds over the last month. She was unable to obtain the 7.5 mg Zepbound due to cost, leading to a two-week period without the medication, during which she experienced significant weight gain and loss of control over her eating. After resuming the medication, she experienced severe nausea and abdominal pain, leading to an emergency room visit where she received fluids and anti-nausea medication.  She is on Wellbutrin SR 200 mg twice daily and Celexa for emotional eating behaviors and requests a refill for Wellbutrin.  She has a history of vitamin D deficiency and is on a weekly prescription of 50,000 IU vitamin D, which she requests a refill for.  She has a history of hypertension, currently managed with losartan 100 mg, with her blood pressure controlled at 123/75 today.          PHYSICAL EXAM:  Blood pressure 123/75, pulse 92, temperature 97.9 F (36.6 C), height 5\' 2"  (1.575 m), weight 272 lb (123.4 kg), last  menstrual period 06/26/2023, SpO2 97%. Body mass index is 49.75 kg/m.  DIAGNOSTIC DATA REVIEWED:  BMET    Component Value Date/Time   NA 137 07/25/2023 1334   K 3.9 07/25/2023 1334   CL 98 07/25/2023 1334   CO2 23 07/25/2023 1334   GLUCOSE 80 07/25/2023 1334   GLUCOSE 103 (H) 07/22/2023 0559   BUN 12 07/25/2023 1334   CREATININE 0.79 07/25/2023 1334   CALCIUM 9.2 07/25/2023 1334   GFRNONAA >60 07/22/2023 0559   GFRAA >90 07/20/2014 2128   Lab Results  Component Value Date   HGBA1C 5.2 10/12/2022   HGBA1C 5.4 06/15/2021   Lab Results  Component Value Date   INSULIN 20.3 10/12/2022   INSULIN 20.6 06/15/2021   Lab Results  Component Value Date   TSH 1.610 02/20/2022   CBC    Component Value Date/Time   WBC 9.3 07/25/2023 1334   WBC 13.0 (H) 07/22/2023 0559   RBC 4.85 07/25/2023 1334   RBC 4.53 07/22/2023 0559   HGB 14.5 07/25/2023 1334   HCT 44.6 07/25/2023 1334   PLT 375 07/25/2023 1334   MCV 92 07/25/2023 1334   MCH 29.9 07/25/2023 1334   MCH 30.9 07/22/2023 0559   MCHC 32.5 07/25/2023 1334   MCHC 34.9 07/22/2023 0559   RDW 13.0 07/25/2023 1334   Iron Studies No results found for: "IRON", "TIBC", "FERRITIN", "IRONPCTSAT" Lipid Panel     Component Value Date/Time   CHOL  207 (H) 10/12/2022 0828   TRIG 110 10/12/2022 0828   HDL 48 10/12/2022 0828   CHOLHDL 4.4 06/15/2021 1036   LDLCALC 139 (H) 10/12/2022 0828   Hepatic Function Panel     Component Value Date/Time   PROT 7.0 07/25/2023 1334   ALBUMIN 4.2 07/25/2023 1334   AST 17 07/25/2023 1334   ALT 15 07/25/2023 1334   ALKPHOS 117 07/25/2023 1334   BILITOT 0.3 07/25/2023 1334      Component Value Date/Time   TSH 1.610 02/20/2022 0742   Nutritional Lab Results  Component Value Date   VD25OH 42.1 10/12/2022   VD25OH 39.7 02/20/2022   VD25OH 42.3 12/21/2021     Assessment and Plan    Obesity She manages obesity with a daily goal of 1700 calories and 100 grams of protein, achieving  this 50% of the time. She is not exercising and has gained two pounds in the last month. A severe reaction to Zepbound 7.5 mg, including nausea and vomiting, led to an ER visit. The reaction was likely due to sensitivity and an abrupt dosage increase after a two-week hiatus. Insurance issues complicate access to Zepbound, requiring prior authorization. The absence of Zepbound for two weeks highlighted its effectiveness in appetite control. - Redo prior authorization for Zepbound 5 mg - Encourage continuation of high-protein diet - Advise on grocery shopping strategies for fresh produce - Discuss potential protein-rich dessert options  Emotional Eating Behaviors She is on Wellbutrin SR 200 mg twice daily and Celexa for emotional eating behaviors, facing challenges in maintaining dietary goals during stress. She requests medication refills. - Refill Wellbutrin SR 200 mg - Continue Celexa  Hypertension Hypertension is well-controlled with losartan 100 mg, with blood pressure at 123/75 mmHg. -Continue to work on diet, exercise and weight loss to control BP  Vitamin D Deficiency She is on a prescription of vitamin D 50,000 IU weekly and requests a refill. - Refill vitamin D 50,000 IU weekly   Follow-up She has a follow-up appointment scheduled for April 29th and needs to schedule a May appointment. - Schedule follow-up appointment for May        She was informed of the importance of frequent follow up visits to maximize her success with intensive lifestyle modifications for her multiple health conditions.    Quillian Quince, MD

## 2023-08-01 ENCOUNTER — Other Ambulatory Visit (HOSPITAL_BASED_OUTPATIENT_CLINIC_OR_DEPARTMENT_OTHER): Payer: Self-pay

## 2023-08-01 NOTE — Telephone Encounter (Signed)
 Medication approved and patient notified via Mychart message.  PA Case: [id number],  Status: Approved,  Coverage Starts on: 08/01/2023 12:00:00 AM,  Coverage Ends on: 01/28/2024 12:00:00 AM..  Authorization Expiration Date: January 28, 2024.

## 2023-08-04 ENCOUNTER — Other Ambulatory Visit (HOSPITAL_BASED_OUTPATIENT_CLINIC_OR_DEPARTMENT_OTHER): Payer: Self-pay

## 2023-08-06 ENCOUNTER — Ambulatory Visit (INDEPENDENT_AMBULATORY_CARE_PROVIDER_SITE_OTHER): Admitting: Dermatology

## 2023-08-06 ENCOUNTER — Encounter: Payer: Self-pay | Admitting: Dermatology

## 2023-08-06 VITALS — BP 159/109

## 2023-08-06 DIAGNOSIS — L900 Lichen sclerosus et atrophicus: Secondary | ICD-10-CM | POA: Diagnosis not present

## 2023-08-06 DIAGNOSIS — L659 Nonscarring hair loss, unspecified: Secondary | ICD-10-CM

## 2023-08-06 NOTE — Patient Instructions (Addendum)
 Hello Sun,  Thank you for visiting today. Here is a summary of the key instructions:  Diagnosis: Lichen Sclerosus  - Medications:   - Continue alternating clobetasol and tacrolimus for the next 3 months:     - Use tacrolimus for 2 weeks     - Then use clobetasol for 2 weeks     - Repeat this cycle for 3 months   - Use Aquaphor for the next week  - Skin Care:   - Treat any new spots that appear with the same medication routine   - Continue current treatment as spots are improving  - Follow-up:   - Return for reassessment in 3 months   - Option to schedule a combined follow-up for skin and hair loss issues in 3 months  - Referrals:   - Referral to a gynecologist at [facility] who specializes in perimenopause and postmenopause   Elonda Husky, MD, is board certified in obstetrics and gynecology. He specializes in menopause and options for hormone replacement at [facility] Sherron Monday, MD is another specialist at [facility]   - Planned Labs:   - Hormone profile tests including:     - Estrogen     - Estradiol     - FSH (Follicle-Stimulating Hormone)     - LH (Luteinizing Hormone)     - Free and total testosterone     - DHEA sulfate  - Additional Instructions:   - A treatment plan for hair loss will be provided   - Consider seeing a gynecologist to monitor estrogen levels and discuss potential treatments  If you have any questions or concerns, please do not hesitate to contact our office.  Warm regards,  Dr. Langston Reusing, Dermatology Important Information  Due to recent changes in healthcare laws, you may see results of your pathology and/or laboratory studies on MyChart before the doctors have had a chance to review them. We understand that in some cases there may be results that are confusing or concerning to you. Please understand that not all results are received at the same time and often the doctors may need to interpret multiple results in  order to provide you with the best plan of care or course of treatment. Therefore, we ask that you please give [place] 2 business days to thoroughly review all your results before contacting the office for clarification. Should we see a critical lab result, you will be contacted sooner.   If You Need Anything After Your Visit  If you have any questions or concerns for your doctor, please call our main line at [phone] If no one answers, please leave a voicemail as directed and we will return your call as soon as possible. Messages left after 4 pm will be answered the following business day.   You may also send [place] a message via MyChart. We typically respond to MyChart messages within 1-2 business days.  For prescription refills, please ask your pharmacy to contact our office. Our fax number is [phone].  If you have an urgent issue when the clinic is closed that cannot wait until the next business day, you can page your doctor at the number below.    Please note that while we do our best to be available for urgent issues outside of office hours, we are not available 24/7.   If you have an urgent issue and are unable to reach [place], you may choose to seek medical care at your doctor's office,  retail clinic, urgent care center, or emergency room.  If you have a medical emergency, please immediately call 911 or go to the emergency department. In the event of inclement weather, please call our main line at [phone] for an update on the status of any delays or closures.  Dermatology Medication Tips: Please keep the boxes that topical medications come in in order to help keep track of the instructions about where and how to use these. Pharmacies typically print the medication instructions only on the boxes and not directly on the medication tubes.   If your medication is too expensive, please contact our office at [phone] or send [place] a message through MyChart.   We are unable to tell what  your co-pay for medications will be in advance as this is different depending on your insurance coverage. However, we may be able to find a substitute medication at lower cost or fill out paperwork to get insurance to cover a needed medication.   If a prior authorization is required to get your medication covered by your insurance company, please allow [place] 1-2 business days to complete this process.  Drug prices often vary depending on where the prescription is filled and some pharmacies may offer cheaper prices.  The website www.goodrx.com contains coupons for medications through different pharmacies. The prices here do not account for what the cost may be with help from insurance (it may be cheaper with your insurance), but the website can give you the price if you did not use any insurance.  - You can print the associated coupon and take it with your prescription to the pharmacy.  - You may also stop by our office during regular business hours and pick up a GoodRx coupon card.  - If you need your prescription sent electronically to a different pharmacy, notify our office through [facility] or by phone at [phone]

## 2023-08-06 NOTE — Progress Notes (Signed)
   Follow-Up Visit   Subjective  Tara Kelley is a 43 y.o. female who presents for the following: Lichen Sclerosus   Patient present today for follow up visit. Patient was last evaluated on 07/24/23. At this visit patient was prescribed Tacrolimus & Clobetasol to alternated eery 2 weeks. Patient reports sxs are slightly better but not completely resolved. Patient denies medication changes.  The following portions of the chart were reviewed this encounter and updated as appropriate: medications, allergies, medical history  Review of Systems:  No other skin or systemic complaints except as noted in HPI or Assessment and Plan.  Objective  Well appearing patient in no apparent distress; mood and affect are within normal limits.   A focused examination was performed of the following areas: abdomen & groin   Relevant exam findings are noted in the Assessment and Plan.  Pathology Report Reviewed with Patient in detail: FINAL DIAGNOSIS and MICROSCOPIC DESCRIPTION Diagnosis Skin , right abdomen (side) - lower LICHEN SCLEROSUS Microscopic Description The epidermis has an effaced rete peg pattern with hyalinization of the papillary dermis with some vascular ectasia and a lymphocytic infiltrate that is focally band-like beneath the hyalinized papillary dermis.    Assessment & Plan   LICHEN SCLEROSUS ET ATROPHICUS Exam: erythematous and white patches +/- erosions at groin & abdomen  Not at Goal  Lichen sclerosus is a chronic inflammatory condition of unknown cause that frequently involves the vaginal area and less commonly extragenital skin, and is NOT sexually transmitted. It frequently causes symptoms of pain and burning.  It requires regular monitoring and treatment with topical steroids to minimize inflammation and to reduce risk of scarring. There is also a risk of cancer in the vaginal area which is very low if inflammation is well controlled. Regular checks of the area are  recommended. Please call if you notice any new or changing spots within this area.  - Assessment: Biopsy confirmed lichen sclerosus in an atypical location outside the vaginal area. After 2 weeks of clobetasol treatment, lesions show improvement, appearing smoother and less rough. Etiology unknown, possibly genetic with an unidentified trigger. No family history reported. Potential contributing factors include diabetes, body weight, and hormonal changes, particularly around perimenopause and postmenopause due to estrogen fluctuations. Inflammatory component present, responding to topical anti-inflammatories.  - Plan:    Continue alternating treatment: 2 weeks tacrolimus followed by 2 weeks clobetasol for the next 3 months    Apply Aquaphor for 1 week    Treat any new spots with the same regimen    Reassess in 3 months    Referral to gynecologist specializing in hormonal changes    Laboratory tests: estrogen, estradiol, FSH, LH, free and total testosterone, and DHEA sulfate  2. Alopecia - Assessment: Patient reports hair loss starting approximately one year ago, characterized by increased shedding during brushing, particularly on the sides. No clumping noted. Onset at age 83 suggests potential relation to perimenopausal changes.  - Plan:    Initiate treatment regimen with topical and oral medications (specifics not provided)    Laboratory tests: estrogen, estradiol, FSH, LH, free and total testosterone, and DHEA sulfate    Follow-up in 3 months to assess both skin and hair loss conditions    No follow-ups on file.    Documentation: I have reviewed the above documentation for accuracy and completeness, and I agree with the above.  I, Shirron Louanne Roussel, CMA, am acting as scribe for [employer], DO.   Louana Roup, DO

## 2023-08-08 ENCOUNTER — Telehealth (INDEPENDENT_AMBULATORY_CARE_PROVIDER_SITE_OTHER): Payer: Self-pay

## 2023-08-08 NOTE — Telephone Encounter (Signed)
 Thank you for trusting [place] with your health care coverage.  Recently, you or your doctor  asked [place] to review a request for the medication, ZEPBOUND 5 MG/0.5 ML PEN, and  the request has been approved. This approval is effective from 08/01/2023 until  01/28/2024.   This approval means that, based on the information given to [place], the  medication is considered medically necessary under your benefit plan. This approval is  for the coverage of ZEPBOUND 5 MG/0.5 ML PEN only. This is not an approval for  ZEPBOUND 5 MG/0.5 ML PEN if used with certain drugs in the same therapeutic class  as listed in the clinical criteria policy.

## 2023-08-24 ENCOUNTER — Other Ambulatory Visit (HOSPITAL_BASED_OUTPATIENT_CLINIC_OR_DEPARTMENT_OTHER): Payer: Self-pay

## 2023-08-28 ENCOUNTER — Other Ambulatory Visit: Payer: Self-pay

## 2023-08-28 ENCOUNTER — Ambulatory Visit (INDEPENDENT_AMBULATORY_CARE_PROVIDER_SITE_OTHER): Admitting: Family Medicine

## 2023-08-28 ENCOUNTER — Encounter (INDEPENDENT_AMBULATORY_CARE_PROVIDER_SITE_OTHER): Payer: Self-pay | Admitting: Family Medicine

## 2023-08-28 ENCOUNTER — Other Ambulatory Visit (HOSPITAL_BASED_OUTPATIENT_CLINIC_OR_DEPARTMENT_OTHER): Payer: Self-pay

## 2023-08-28 VITALS — BP 121/78 | HR 84 | Temp 98.0°F | Ht 62.0 in | Wt 274.0 lb

## 2023-08-28 DIAGNOSIS — R632 Polyphagia: Secondary | ICD-10-CM

## 2023-08-28 DIAGNOSIS — E669 Obesity, unspecified: Secondary | ICD-10-CM | POA: Diagnosis not present

## 2023-08-28 DIAGNOSIS — E559 Vitamin D deficiency, unspecified: Secondary | ICD-10-CM | POA: Diagnosis not present

## 2023-08-28 DIAGNOSIS — F39 Unspecified mood [affective] disorder: Secondary | ICD-10-CM

## 2023-08-28 DIAGNOSIS — F5089 Other specified eating disorder: Secondary | ICD-10-CM | POA: Diagnosis not present

## 2023-08-28 DIAGNOSIS — Z6841 Body Mass Index (BMI) 40.0 and over, adult: Secondary | ICD-10-CM

## 2023-08-28 MED ORDER — VITAMIN D (ERGOCALCIFEROL) 1.25 MG (50000 UNIT) PO CAPS
50000.0000 [IU] | ORAL_CAPSULE | ORAL | 0 refills | Status: DC
Start: 2023-08-28 — End: 2023-10-01
  Filled 2023-08-28 – 2023-09-03 (×2): qty 5, 35d supply, fill #0

## 2023-08-28 MED ORDER — TIRZEPATIDE-WEIGHT MANAGEMENT 5 MG/0.5ML ~~LOC~~ SOAJ
5.0000 mg | SUBCUTANEOUS | 0 refills | Status: DC
  Filled 2023-08-28: qty 2, 28d supply, fill #0

## 2023-08-28 MED ORDER — BUPROPION HCL ER (SR) 200 MG PO TB12
200.0000 mg | ORAL_TABLET | Freq: Two times a day (BID) | ORAL | 0 refills | Status: DC
  Filled 2023-08-28: qty 60, 30d supply, fill #0

## 2023-08-28 NOTE — Progress Notes (Signed)
 Office: [phone]  /  Fax: [phone]  WEIGHT SUMMARY AND BIOMETRICS  Anthropometric Measurements Height: 5\' 2"  (1.575 m) Weight: 274 lb (124.3 kg) BMI (Calculated): 50.1 Weight at Last Visit: 272lb Weight Lost Since Last Visit: 0 Weight Gained Since Last Visit: 2lb Starting Weight: 311lb Total Weight Loss (lbs): 38 lb (17.2 kg) Peak Weight: 311lb   Body Composition  Body Fat %: 53.2 % Fat Mass (lbs): 146.2 lbs Muscle Mass (lbs): 122.2 lbs Total Body Water (lbs): 97.6 lbs Visceral Fat Rating : 19   Other Clinical Data Fasting: no Labs: no Today's Visit #: 38 Starting Date: 06/15/21    Chief Complaint: OBESITY    History of Present Illness Tara Kelley is a 43 year old female who presents for obesity treatment plan assessment and progress evaluation.  She has been following a journaling plan with a target of 1700 calories and 100 or more grams of protein, which she adheres to successfully about 75% of the time. She engages in a combination of walking and strength training for 30 minutes, three times a week. Despite these efforts, she has gained two pounds in the last four weeks. She has been off her HCTZ, a diuretic, for a few days due to pharmacy access issues, which she suspects may have contributed to fluid retention and weight gain. She experiences swelling in her hands, which she associates with fluid retention. No significant changes in her exercise routine or diet that could explain the weight gain.  She has a history of vitamin D  deficiency and is currently on a prescription of 50,000 international units weekly. Her last vitamin D  level was 42.1, measured about eight months ago. She requests a refill for her vitamin D  prescription.  She has a history of emotional eating behaviors and is being treated with Wellbutrin  SR, 200 mg twice daily. She reports stability on this medication and requests a refill.  She also has a history of polyphagia and is  working on improving her diet by decreasing simple carbohydrates to manage her insulin  levels and reduce excessive hunger signals and fluctuating blood sugars. She is on Zepbound  5 mg weekly. She experienced nausea and vomiting initially with Zepbound  but has since stabilized and prefers not to adjust the dose.  She engages in gardening and other physical activities such as cutting grass and mulching, which she finds physically demanding. She has been consistent with her exercise routine and journaling despite these activities.      PHYSICAL EXAM:  Blood pressure 121/78, pulse 84, temperature 98 F (36.7 C), height 5\' 2"  (1.575 m), weight 274 lb (124.3 kg), last menstrual period 08/21/2023, SpO2 98%. Body mass index is 50.12 kg/m.  DIAGNOSTIC DATA REVIEWED:  BMET    Component Value Date/Time   NA 137 07/25/2023 1334   K 3.9 07/25/2023 1334   CL 98 07/25/2023 1334   CO2 23 07/25/2023 1334   GLUCOSE 80 07/25/2023 1334   GLUCOSE 103 (H) 07/22/2023 0559   BUN 12 07/25/2023 1334   CREATININE 0.79 07/25/2023 1334   CALCIUM 9.2 07/25/2023 1334   GFRNONAA >60 07/22/2023 0559   GFRAA >90 07/20/2014 2128   Lab Results  Component Value Date   HGBA1C 5.2 10/12/2022   HGBA1C 5.4 06/15/2021   Lab Results  Component Value Date   INSULIN  20.3 10/12/2022   INSULIN  20.6 06/15/2021   Lab Results  Component Value Date   TSH 1.610 02/20/2022   CBC    Component Value Date/Time   WBC  9.3 07/25/2023 1334   WBC 13.0 (H) 07/22/2023 0559   RBC 4.85 07/25/2023 1334   RBC 4.53 07/22/2023 0559   HGB 14.5 07/25/2023 1334   HCT 44.6 07/25/2023 1334   PLT 375 07/25/2023 1334   MCV 92 07/25/2023 1334   MCH 29.9 07/25/2023 1334   MCH 30.9 07/22/2023 0559   MCHC 32.5 07/25/2023 1334   MCHC 34.9 07/22/2023 0559   RDW 13.0 07/25/2023 1334   Iron Studies No results found for: "IRON", "TIBC", "FERRITIN", "IRONPCTSAT" Lipid Panel     Component Value Date/Time   CHOL 207 (H) 10/12/2022 0828    TRIG 110 10/12/2022 0828   HDL 48 10/12/2022 0828   CHOLHDL 4.4 06/15/2021 1036   LDLCALC 139 (H) 10/12/2022 0828   Hepatic Function Panel     Component Value Date/Time   PROT 7.0 07/25/2023 1334   ALBUMIN 4.2 07/25/2023 1334   AST 17 07/25/2023 1334   ALT 15 07/25/2023 1334   ALKPHOS 117 07/25/2023 1334   BILITOT 0.3 07/25/2023 1334      Component Value Date/Time   TSH 1.610 02/20/2022 0742   Nutritional Lab Results  Component Value Date   VD25OH 42.1 10/12/2022   VD25OH 39.7 02/20/2022   VD25OH 42.3 12/21/2021     Assessment and Plan Assessment & Plan Obesity Obesity management includes a 1700-calorie diet with at least 100 grams of protein daily, adhered to 75% of the time. Engages in walking and strength training for 30 minutes, three times a week. Recent weight gain of two pounds attributed to water retention due to missed doses of HCTZ. Exercise routine adjusted to focus on specific muscle groups to prevent overuse injuries. - Continue 1700-calorie diet with 100 grams of protein daily - Maintain exercise regimen with walking and strength training - Ensure regular HCTZ intake to manage fluid retention - Follow adjusted exercise routine focusing on specific muscle groups  Emotional eating disorder Emotional eating managed with Wellbutrin  SR 200 mg BID. Requests refill. Stable - Refill Wellbutrin  SR 200 mg BID  Polyphagia Polyphagia managed with dietary modifications to decrease simple carbohydrates and Zepbound  5 mg weekly. Previous episodes of nausea with Zepbound  have stabilized. Advised to avoid long periods without eating and to eat slowly to prevent nausea. - Continue Zepbound  5 mg weekly - Adjust dietary habits to prevent nausea - Avoid long periods without eating and eat slowly  Vitamin D  deficiency Vitamin D  deficiency managed with 50,000 IU of vitamin D  weekly. Last level was 42.1 eight months ago. Requests refill. - Refill vitamin D  50,000 IU  weekly    She was informed of the importance of frequent follow up visits to maximize her success with intensive lifestyle modifications for her multiple health conditions.    Jasmine Mesi, MD

## 2023-09-01 ENCOUNTER — Ambulatory Visit
Admission: RE | Admit: 2023-09-01 | Discharge: 2023-09-01 | Disposition: A | Discharge status: Home / Self Care | Source: Ambulatory Visit | Attending: Family Medicine | Admitting: Family Medicine

## 2023-09-01 ENCOUNTER — Other Ambulatory Visit: Payer: Self-pay

## 2023-09-01 VITALS — BP 144/92 | HR 102 | Temp 98.1°F | Resp 17

## 2023-09-01 DIAGNOSIS — W57XXXA Bitten or stung by nonvenomous insect and other nonvenomous arthropods, initial encounter: Secondary | ICD-10-CM | POA: Diagnosis not present

## 2023-09-01 DIAGNOSIS — S40261A Insect bite (nonvenomous) of right shoulder, initial encounter: Secondary | ICD-10-CM

## 2023-09-01 MED ORDER — DOXYCYCLINE HYCLATE 100 MG PO CAPS: 100.0000 mg | ORAL_CAPSULE | Freq: Two times a day (BID) | ORAL | 0 refills | Status: AC

## 2023-09-01 NOTE — Discharge Instructions (Addendum)
 Advised patient to take medication as directed with food to completion.  Encouraged to increase daily water intake to 64 ounces per day while taking this medication.  Advised if symptoms worsen and/or unresolved please follow-up with your PCP or here for further evaluation.
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
## 2023-09-01 NOTE — ED Triage Notes (Signed)
 Pt c/o 3 tick bites x 1 week except on under LT arm, noticed this am. Itchy. Aquafor anti itch prn.

## 2023-09-03 ENCOUNTER — Other Ambulatory Visit (HOSPITAL_BASED_OUTPATIENT_CLINIC_OR_DEPARTMENT_OTHER): Payer: Self-pay

## 2023-09-03 ENCOUNTER — Other Ambulatory Visit: Payer: Self-pay

## 2023-09-27 ENCOUNTER — Encounter (INDEPENDENT_AMBULATORY_CARE_PROVIDER_SITE_OTHER): Payer: Self-pay | Admitting: Family Medicine

## 2023-09-27 ENCOUNTER — Other Ambulatory Visit (HOSPITAL_BASED_OUTPATIENT_CLINIC_OR_DEPARTMENT_OTHER): Payer: Self-pay

## 2023-09-27 ENCOUNTER — Other Ambulatory Visit (INDEPENDENT_AMBULATORY_CARE_PROVIDER_SITE_OTHER): Payer: Self-pay

## 2023-09-27 DIAGNOSIS — R632 Polyphagia: Secondary | ICD-10-CM

## 2023-09-27 MED ORDER — TIRZEPATIDE-WEIGHT MANAGEMENT 5 MG/0.5ML ~~LOC~~ SOAJ
5.0000 mg | SUBCUTANEOUS | 0 refills | Status: DC
Start: 2023-09-27 — End: 2023-10-01
  Filled 2023-09-27: qty 2, 28d supply, fill #0

## 2023-09-27 NOTE — Telephone Encounter (Signed)
 Ok to refill Zepbound  x 1

## 2023-10-01 ENCOUNTER — Encounter (INDEPENDENT_AMBULATORY_CARE_PROVIDER_SITE_OTHER): Payer: Self-pay | Admitting: Family Medicine

## 2023-10-01 ENCOUNTER — Ambulatory Visit (INDEPENDENT_AMBULATORY_CARE_PROVIDER_SITE_OTHER): Admitting: Family Medicine

## 2023-10-01 ENCOUNTER — Other Ambulatory Visit (HOSPITAL_BASED_OUTPATIENT_CLINIC_OR_DEPARTMENT_OTHER): Payer: Self-pay

## 2023-10-01 VITALS — BP 118/88 | HR 94 | Temp 98.0°F | Ht 62.0 in | Wt 268.0 lb

## 2023-10-01 DIAGNOSIS — K219 Gastro-esophageal reflux disease without esophagitis: Secondary | ICD-10-CM | POA: Diagnosis not present

## 2023-10-01 DIAGNOSIS — E559 Vitamin D deficiency, unspecified: Secondary | ICD-10-CM

## 2023-10-01 DIAGNOSIS — F5089 Other specified eating disorder: Secondary | ICD-10-CM

## 2023-10-01 DIAGNOSIS — F39 Unspecified mood [affective] disorder: Secondary | ICD-10-CM

## 2023-10-01 DIAGNOSIS — E88819 Insulin resistance, unspecified: Secondary | ICD-10-CM | POA: Diagnosis not present

## 2023-10-01 DIAGNOSIS — R632 Polyphagia: Secondary | ICD-10-CM | POA: Diagnosis not present

## 2023-10-01 DIAGNOSIS — Z6841 Body Mass Index (BMI) 40.0 and over, adult: Secondary | ICD-10-CM

## 2023-10-01 DIAGNOSIS — E669 Obesity, unspecified: Secondary | ICD-10-CM

## 2023-10-01 MED ORDER — BUPROPION HCL ER (SR) 200 MG PO TB12
200.0000 mg | ORAL_TABLET | Freq: Two times a day (BID) | ORAL | 0 refills | Status: DC
  Filled 2023-10-01: qty 60, 30d supply, fill #0

## 2023-10-01 MED ORDER — VITAMIN D (ERGOCALCIFEROL) 1.25 MG (50000 UNIT) PO CAPS
50000.0000 [IU] | ORAL_CAPSULE | ORAL | 0 refills | Status: DC
  Filled 2023-10-01 – 2023-10-21 (×2): qty 5, 35d supply, fill #0

## 2023-10-01 MED ORDER — TIRZEPATIDE-WEIGHT MANAGEMENT 5 MG/0.5ML ~~LOC~~ SOAJ
5.0000 mg | SUBCUTANEOUS | 0 refills | Status: DC
  Filled 2023-10-01 – 2023-10-21 (×2): qty 2, 28d supply, fill #0

## 2023-10-01 MED ORDER — OMEPRAZOLE 20 MG PO CPDR
20.0000 mg | DELAYED_RELEASE_CAPSULE | Freq: Every day | ORAL | 0 refills | Status: DC
  Filled 2023-10-01 – 2023-10-21 (×2): qty 90, 90d supply, fill #0

## 2023-10-01 NOTE — Progress Notes (Signed)
 Office: [phone]  /  Fax: [phone]  WEIGHT SUMMARY AND BIOMETRICS  Anthropometric Measurements Height: 5\' 2"  (1.575 m) Weight: 268 lb (121.6 kg) BMI (Calculated): 49.01 Weight at Last Visit: 274 lb Weight Lost Since Last Visit: 6 lb Weight Gained Since Last Visit: 0 Starting Weight: 311 lb Total Weight Loss (lbs): 43 lb (19.5 kg) Peak Weight: 311 lb   Body Composition  Body Fat %: 50.5 % Fat Mass (lbs): 135.6 lbs Muscle Mass (lbs): 126.2 lbs Total Body Water (lbs): 91.2 lbs Visceral Fat Rating : 17   Other Clinical Data Fasting: yes Labs: yes Today's Visit #: 39 Starting Date: 06/15/21    Chief Complaint: OBESITY   History of Present Illness Tara Kelley is a 43 year old female who presents for obesity treatment and progress assessment.  She is following a journaling plan to manage her obesity, aiming to keep her daily caloric intake between 1500 and 1700 calories, with at least 100 grams of protein per day. She adheres to this plan about 75% of the time over the past month. She exercises for 45 minutes three times a week, focusing on strengthening and walking. She has lost six pounds since her last visit a month ago.  She is addressing her vitamin D  deficiency with prescription vitamin D  at a dose of 50,000 IU per week. Her last vitamin D  level, checked almost a year ago, was 42, which was below the target range of 50-60. She is due for a repeat test soon.  She has a history of GERD, managed with omeprazole  20 mg daily, and reports stability on this dose. She requests a refill of her omeprazole .  She has a history of emotional eating behaviors and is being treated with Wellbutrin  SR 200 mg BID. This medication does not increase her blood pressure and she is doing well on it.  She is being treated for polyphagia and insulin  resistance with Zepbound  5 mg weekly. Increasing her protein intake is helping with her weight loss, metabolism, and polyphagia. She  mentions a concern about her medication refill schedule being misaligned with her visits, which affects her eating patterns as she approaches her injection day.      PHYSICAL EXAM:  Blood pressure 118/88, pulse 94, temperature 98 F (36.7 C), height 5\' 2"  (1.575 m), weight 268 lb (121.6 kg), SpO2 99%. Body mass index is 49.02 kg/m.  DIAGNOSTIC DATA REVIEWED:  BMET    Component Value Date/Time   NA 137 07/25/2023 1334   K 3.9 07/25/2023 1334   CL 98 07/25/2023 1334   CO2 23 07/25/2023 1334   GLUCOSE 80 07/25/2023 1334   GLUCOSE 103 (H) 07/22/2023 0559   BUN 12 07/25/2023 1334   CREATININE 0.79 07/25/2023 1334   CALCIUM 9.2 07/25/2023 1334   GFRNONAA >60 07/22/2023 0559   GFRAA >90 07/20/2014 2128   Lab Results  Component Value Date   HGBA1C 5.2 10/12/2022   HGBA1C 5.4 06/15/2021   Lab Results  Component Value Date   INSULIN  20.3 10/12/2022   INSULIN  20.6 06/15/2021   Lab Results  Component Value Date   TSH 1.610 02/20/2022   CBC    Component Value Date/Time   WBC 9.3 07/25/2023 1334   WBC 13.0 (H) 07/22/2023 0559   RBC 4.85 07/25/2023 1334   RBC 4.53 07/22/2023 0559   HGB 14.5 07/25/2023 1334   HCT 44.6 07/25/2023 1334   PLT 375 07/25/2023 1334   MCV 92 07/25/2023 1334   MCH 29.9  07/25/2023 1334   MCH 30.9 07/22/2023 0559   MCHC 32.5 07/25/2023 1334   MCHC 34.9 07/22/2023 0559   RDW 13.0 07/25/2023 1334   Iron Studies No results found for: "IRON", "TIBC", "FERRITIN", "IRONPCTSAT" Lipid Panel     Component Value Date/Time   CHOL 207 (H) 10/12/2022 0828   TRIG 110 10/12/2022 0828   HDL 48 10/12/2022 0828   CHOLHDL 4.4 06/15/2021 1036   LDLCALC 139 (H) 10/12/2022 0828   Hepatic Function Panel     Component Value Date/Time   PROT 7.0 07/25/2023 1334   ALBUMIN 4.2 07/25/2023 1334   AST 17 07/25/2023 1334   ALT 15 07/25/2023 1334   ALKPHOS 117 07/25/2023 1334   BILITOT 0.3 07/25/2023 1334      Component Value Date/Time   TSH 1.610 02/20/2022  0742   Nutritional Lab Results  Component Value Date   VD25OH 42.1 10/12/2022   VD25OH 39.7 02/20/2022   VD25OH 42.3 12/21/2021     Assessment and Plan Assessment & Plan Obesity and Insulin  Resistance Obesity management focuses on dietary control and exercise. She follows a calorie-restricted diet of 1500-1700 calories per day with a protein intake of at least 100 grams daily. She exercises 45 minutes three times a week, including strengthening and walking. She has lost 6 pounds in the last month. Emotional eating behaviors and polyphagia are addressed as part of her weight management strategy. She reports improved satiety with increased protein intake and consistency with journaling and dietary adjustments. - Continue dietary plan with 1500-1700 calories per day and at least 100 grams of protein daily. - Continue exercise regimen of 45 minutes three times a week. - Refill Zepbound  5 mg weekly for polyphagia. - Refill Wellbutrin  SR 200 mg BID for emotional eating behaviors. -check labs today  Polyphagia Polyphagia is managed with Zepbound  5 mg weekly. Increased protein intake aids weight loss, metabolism, and polyphagia. No gastrointestinal issues reported with Zepbound . She expressed concern about aligning medication refills with visits to prevent impact on eating patterns. - Refill Zepbound  5 mg weekly for polyphagia.  Emotional eating behaviors Emotional eating behaviors are managed with Wellbutrin  SR 200 mg BID. She is doing well on this medication without increased blood pressure. - Refill Wellbutrin  SR 200 mg BID.  Gastroesophageal reflux disease (GERD) GERD is well-managed with omeprazole  20 mg daily. Weight loss efforts are ongoing to improve symptoms. - Refill omeprazole  20 mg daily.  Vitamin D  deficiency Vitamin D  deficiency is managed with vitamin D  50,000 IU weekly. The last vitamin D  level was 42, below the target range of 50-60. A repeat vitamin D  level is due soon. -  Continue vitamin D  50,000 IU weekly. - Order repeat vitamin D  level.      She was informed of the importance of frequent follow up visits to maximize her success with intensive lifestyle modifications for her multiple health conditions.    Jasmine Mesi, MD

## 2023-10-02 LAB — CBC WITH DIFFERENTIAL/PLATELET
Basophils Absolute: 0.1 10*3/uL (ref 0.0–0.2)
Basos: 1 %
EOS (ABSOLUTE): 0.3 10*3/uL (ref 0.0–0.4)
Eos: 4 %
Hematocrit: 46.6 % (ref 34.0–46.6)
Hemoglobin: 15.1 g/dL (ref 11.1–15.9)
Immature Grans (Abs): 0 10*3/uL (ref 0.0–0.1)
Immature Granulocytes: 0 %
Lymphocytes Absolute: 1.8 10*3/uL (ref 0.7–3.1)
Lymphs: 23 %
MCH: 30 pg (ref 26.6–33.0)
MCHC: 32.4 g/dL (ref 31.5–35.7)
MCV: 93 fL (ref 79–97)
Monocytes Absolute: 0.5 10*3/uL (ref 0.1–0.9)
Monocytes: 6 %
Neutrophils Absolute: 5.2 10*3/uL (ref 1.4–7.0)
Neutrophils: 65 %
Platelets: 349 10*3/uL (ref 150–450)
RBC: 5.03 x10E6/uL (ref 3.77–5.28)
RDW: 12.8 % (ref 11.7–15.4)
WBC: 7.9 10*3/uL (ref 3.4–10.8)

## 2023-10-02 LAB — LIPID PANEL WITH LDL/HDL RATIO
Cholesterol, Total: 245 mg/dL — ABNORMAL HIGH (ref 100–199)
HDL: 48 mg/dL (ref >39)
LDL Chol Calc (NIH): 167 mg/dL — ABNORMAL HIGH (ref 0–99)
LDL/HDL Ratio: 3.5 ratio — ABNORMAL HIGH (ref 0.0–3.2)
Triglycerides: 162 mg/dL — ABNORMAL HIGH (ref 0–149)
VLDL Cholesterol Cal: 30 mg/dL (ref 5–40)

## 2023-10-02 LAB — CMP14+EGFR
ALT: 14 IU/L (ref 0–32)
AST: 18 IU/L (ref 0–40)
Albumin: 4.2 g/dL (ref 3.9–4.9)
Alkaline Phosphatase: 112 IU/L (ref 44–121)
BUN/Creatinine Ratio: 14 (ref 9–23)
BUN: 13 mg/dL (ref 6–24)
Bilirubin Total: 0.5 mg/dL (ref 0.0–1.2)
CO2: 22 mmol/L (ref 20–29)
Calcium: 9.2 mg/dL (ref 8.7–10.2)
Chloride: 98 mmol/L (ref 96–106)
Creatinine, Ser: 0.91 mg/dL (ref 0.57–1.00)
Globulin, Total: 2.8 g/dL (ref 1.5–4.5)
Glucose: 72 mg/dL (ref 70–99)
Potassium: 3.5 mmol/L (ref 3.5–5.2)
Sodium: 137 mmol/L (ref 134–144)
Total Protein: 7 g/dL (ref 6.0–8.5)
eGFR: 81 mL/min/{1.73_m2} (ref >59)

## 2023-10-02 LAB — HEMOGLOBIN A1C
Est. average glucose Bld gHb Est-mCnc: 97 mg/dL
Hgb A1c MFr Bld: 5 % (ref 4.8–5.6)

## 2023-10-02 LAB — TSH: TSH: 1.7 u[IU]/mL (ref 0.450–4.500)

## 2023-10-02 LAB — VITAMIN D 25 HYDROXY (VIT D DEFICIENCY, FRACTURES): Vit D, 25-Hydroxy: 54.3 ng/mL (ref 30.0–100.0)

## 2023-10-02 LAB — INSULIN, RANDOM: INSULIN: 17.2 u[IU]/mL (ref 2.6–24.9)

## 2023-10-02 LAB — VITAMIN B12: Vitamin B-12: 410 pg/mL (ref 232–1245)

## 2023-10-08 ENCOUNTER — Other Ambulatory Visit (HOSPITAL_BASED_OUTPATIENT_CLINIC_OR_DEPARTMENT_OTHER): Payer: Self-pay | Admitting: Family Medicine

## 2023-10-08 ENCOUNTER — Other Ambulatory Visit (HOSPITAL_BASED_OUTPATIENT_CLINIC_OR_DEPARTMENT_OTHER): Payer: Self-pay

## 2023-10-08 ENCOUNTER — Other Ambulatory Visit: Payer: Self-pay

## 2023-10-08 MED ORDER — CITALOPRAM HYDROBROMIDE 20 MG PO TABS
30.0000 mg | ORAL_TABLET | Freq: Every day | ORAL | 5 refills | Status: DC
  Filled 2023-10-08: qty 45, 30d supply, fill #0
  Filled 2023-10-21 – 2023-11-09 (×2): qty 45, 30d supply, fill #1
  Filled 2023-12-04: qty 45, 30d supply, fill #2
  Filled 2024-01-09: qty 45, 30d supply, fill #3
  Filled 2024-01-17 – 2024-02-08 (×2): qty 45, 30d supply, fill #4

## 2023-10-09 ENCOUNTER — Other Ambulatory Visit: Payer: Self-pay

## 2023-10-21 ENCOUNTER — Other Ambulatory Visit (INDEPENDENT_AMBULATORY_CARE_PROVIDER_SITE_OTHER): Payer: Self-pay | Admitting: Family Medicine

## 2023-10-21 DIAGNOSIS — F39 Unspecified mood [affective] disorder: Secondary | ICD-10-CM

## 2023-10-22 ENCOUNTER — Other Ambulatory Visit: Payer: Self-pay

## 2023-10-22 ENCOUNTER — Encounter (HOSPITAL_BASED_OUTPATIENT_CLINIC_OR_DEPARTMENT_OTHER): Payer: Self-pay

## 2023-10-22 ENCOUNTER — Other Ambulatory Visit (HOSPITAL_BASED_OUTPATIENT_CLINIC_OR_DEPARTMENT_OTHER): Payer: Self-pay

## 2023-10-29 ENCOUNTER — Other Ambulatory Visit (HOSPITAL_BASED_OUTPATIENT_CLINIC_OR_DEPARTMENT_OTHER): Payer: Self-pay

## 2023-10-29 ENCOUNTER — Encounter (INDEPENDENT_AMBULATORY_CARE_PROVIDER_SITE_OTHER): Payer: Self-pay | Admitting: Family Medicine

## 2023-10-29 ENCOUNTER — Ambulatory Visit (INDEPENDENT_AMBULATORY_CARE_PROVIDER_SITE_OTHER): Admitting: Family Medicine

## 2023-10-29 VITALS — BP 127/85 | HR 86 | Temp 97.9°F | Ht 62.0 in | Wt 272.0 lb

## 2023-10-29 DIAGNOSIS — E559 Vitamin D deficiency, unspecified: Secondary | ICD-10-CM | POA: Diagnosis not present

## 2023-10-29 DIAGNOSIS — E785 Hyperlipidemia, unspecified: Secondary | ICD-10-CM | POA: Diagnosis not present

## 2023-10-29 DIAGNOSIS — R632 Polyphagia: Secondary | ICD-10-CM | POA: Diagnosis not present

## 2023-10-29 DIAGNOSIS — E538 Deficiency of other specified B group vitamins: Secondary | ICD-10-CM

## 2023-10-29 DIAGNOSIS — Z6841 Body Mass Index (BMI) 40.0 and over, adult: Secondary | ICD-10-CM

## 2023-10-29 DIAGNOSIS — F39 Unspecified mood [affective] disorder: Secondary | ICD-10-CM

## 2023-10-29 DIAGNOSIS — E669 Obesity, unspecified: Secondary | ICD-10-CM

## 2023-10-29 DIAGNOSIS — E782 Mixed hyperlipidemia: Secondary | ICD-10-CM

## 2023-10-29 MED ORDER — BUPROPION HCL ER (SR) 200 MG PO TB12
200.0000 mg | ORAL_TABLET | Freq: Two times a day (BID) | ORAL | 0 refills | Status: DC
  Filled 2023-10-29 – 2023-11-20 (×2): qty 60, 30d supply, fill #0

## 2023-10-29 MED ORDER — VITAMIN D (ERGOCALCIFEROL) 1.25 MG (50000 UNIT) PO CAPS
50000.0000 [IU] | ORAL_CAPSULE | ORAL | 0 refills | Status: DC
Start: 2023-10-29 — End: 2023-11-26
  Filled 2023-10-29 – 2023-11-21 (×3): qty 5, 35d supply, fill #0

## 2023-10-29 MED ORDER — ROSUVASTATIN CALCIUM 5 MG PO TABS
5.0000 mg | ORAL_TABLET | Freq: Every day | ORAL | 1 refills | Status: DC
  Filled 2023-10-29: qty 30, 30d supply, fill #0

## 2023-10-29 MED ORDER — TIRZEPATIDE-WEIGHT MANAGEMENT 5 MG/0.5ML ~~LOC~~ SOAJ
5.0000 mg | SUBCUTANEOUS | 0 refills | Status: DC
  Filled 2023-10-29 – 2023-11-20 (×2): qty 2, 28d supply, fill #0

## 2023-10-29 NOTE — Progress Notes (Signed)
 Office: [phone]  /  Fax: [phone]  WEIGHT SUMMARY AND BIOMETRICS  Anthropometric Measurements Height: 5' 2 (1.575 m) Weight: 272 lb (123.4 kg) BMI (Calculated): 49.74 Weight at Last Visit: 268 lb Weight Lost Since Last Visit: 0 Weight Gained Since Last Visit: 4 lb Starting Weight: 311 lb Total Weight Loss (lbs): 39 lb (17.7 kg) Peak Weight: 311 lb   Body Composition  Body Fat %: 51.5 % Fat Mass (lbs): 140 lbs Muscle Mass (lbs): 125.4 lbs Total Body Water (lbs): 93.8 lbs Visceral Fat Rating : 18   Other Clinical Data Fasting: Yes Labs: No Today's Visit #: 40 Starting Date: 06/15/21    Chief Complaint: OBESITY    History of Present Illness Tara Kelley is a 43 year old female who presents for obesity treatment and progress assessment.  She has been following a calorie-controlled diet plan of 1500 to 1700 calories with over 100 grams of protein, achieving these goals about 50% of the time. She is not currently engaging in any exercise and has gained four pounds over the last four weeks. She has a history of emotional eating behaviors and is being treated with bupropion  SR 200 mg twice daily. She also has a diagnosis of polyphagia and is being treated with Zepbound  5 mg, which is working well for her hunger control.  She is being treated for vitamin D  deficiency with 50,000 IU of prescription vitamin D  and requests a refill, noting no side effects from the treatment.  She reports working 50 hours a week recently due to work demands, which has impacted her ability to maintain her dietary goals and manage stress effectively. She has been experimenting with meal planning using an app to help manage her diet more effectively, including preparing meals in advance and using recipes that meet her dietary requirements. She has also been trying to incorporate more protein-rich recipes and has been using a protein pudding recipe as a dessert option.  She has an alarm  set to remind her to take the second dose of bupropion , but sometimes misses it if she is not at home. She tries to keep her medication accessible and has been working on improving her adherence to the dosing schedule.      PHYSICAL EXAM:  Blood pressure 127/85, pulse 86, temperature 97.9 F (36.6 C), height 5' 2 (1.575 m), weight 272 lb (123.4 kg), SpO2 98%. Body mass index is 49.75 kg/m.  DIAGNOSTIC DATA REVIEWED:  BMET    Component Value Date/Time   NA 137 10/01/2023 0830   K 3.5 10/01/2023 0830   CL 98 10/01/2023 0830   CO2 22 10/01/2023 0830   GLUCOSE 72 10/01/2023 0830   GLUCOSE 103 (H) 07/22/2023 0559   BUN 13 10/01/2023 0830   CREATININE 0.91 10/01/2023 0830   CALCIUM 9.2 10/01/2023 0830   GFRNONAA >60 07/22/2023 0559   GFRAA >90 07/20/2014 2128   Lab Results  Component Value Date   HGBA1C 5.0 10/01/2023   HGBA1C 5.4 06/15/2021   Lab Results  Component Value Date   INSULIN  17.2 10/01/2023   INSULIN  20.6 06/15/2021   Lab Results  Component Value Date   TSH 1.700 10/01/2023   CBC    Component Value Date/Time   WBC 7.9 10/01/2023 0830   WBC 13.0 (H) 07/22/2023 0559   RBC 5.03 10/01/2023 0830   RBC 4.53 07/22/2023 0559   HGB 15.1 10/01/2023 0830   HCT 46.6 10/01/2023 0830   PLT 349 10/01/2023 0830   MCV  93 10/01/2023 0830   MCH 30.0 10/01/2023 0830   MCH 30.9 07/22/2023 0559   MCHC 32.4 10/01/2023 0830   MCHC 34.9 07/22/2023 0559   RDW 12.8 10/01/2023 0830   Iron Studies No results found for: IRON, TIBC, FERRITIN, IRONPCTSAT Lipid Panel     Component Value Date/Time   CHOL 245 (H) 10/01/2023 0830   TRIG 162 (H) 10/01/2023 0830   HDL 48 10/01/2023 0830   CHOLHDL 4.4 06/15/2021 1036   LDLCALC 167 (H) 10/01/2023 0830   Hepatic Function Panel     Component Value Date/Time   PROT 7.0 10/01/2023 0830   ALBUMIN 4.2 10/01/2023 0830   AST 18 10/01/2023 0830   ALT 14 10/01/2023 0830   ALKPHOS 112 10/01/2023 0830   BILITOT 0.5 10/01/2023  0830      Component Value Date/Time   TSH 1.700 10/01/2023 0830   Nutritional Lab Results  Component Value Date   VD25OH 54.3 10/01/2023   VD25OH 42.1 10/12/2022   VD25OH 39.7 02/20/2022     Assessment and Plan Assessment & Plan Obesity and Polyphagia She is struggling with weight management, having gained four pounds in the last four weeks. She follows a calorie-restricted diet plan with a protein goal but meets these goals only about 50% of the time. She is not currently exercising and exhibits emotional eating behaviors. She uses Chat[employer] for meal planning and experiments with recipes to manage her diet. Zepbound  for polyphagia is effective. The main challenge is related to her work schedule and stress management, rather than hunger. - Continue current calorie-restricted diet plan with protein goals. - Continue Zepbound  5 mg for polyphagia. - Encourage stress management techniques and better work-life balance.  Hyperlipidemia Her LDL cholesterol is elevated, suggesting a genetic component as it has not improved with weight loss. Triglycerides are also elevated, indicating increased carbohydrate intake. A statin is recommended due to the genetic nature of hyperlipidemia and cardiovascular disease risk. Crestor is preferred for its low side effect profile, particularly in genetic hyperlipidemia. Potential side effects include muscle aches due to CoQ10 depletion, which is rare and typically occurs in older individuals. - Start Crestor (rosuvastatin) at a low dose, preferably taken at night for optimal efficacy. - Monitor cholesterol levels in three months. - Discuss potential side effects of statins, including muscle aches due to CoQ10 depletion.  Vitamin D  Deficiency She is on 50,000 IU of prescription vitamin D  with no side effects. Her vitamin D  levels are within the desired range. - Refill prescription for 50,000 IU vitamin D .  Decreased B12 Her B12 levels have drifted down  slightly. Adequate protein intake is encouraged to maintain B12 levels. - Encourage consistent intake of B12 supplements if levels remain low. - Ensure adequate dietary protein intake.  Follow-up She has follow-up appointments scheduled to monitor her progress. - Follow up in four weeks. - Schedule additional follow-up appointments in July and August.    She was informed of the importance of frequent follow up visits to maximize her success with intensive lifestyle modifications for her multiple health conditions.    Louann Penton, MD

## 2023-11-01 DIAGNOSIS — L659 Nonscarring hair loss, unspecified: Secondary | ICD-10-CM | POA: Diagnosis not present

## 2023-11-03 LAB — FSH/LH
FSH: 4.1 m[IU]/mL
LH: 7.1 m[IU]/mL

## 2023-11-03 LAB — ESTROGENS, TOTAL: Estrogen: 346 pg/mL

## 2023-11-03 LAB — DHEA-SULFATE: DHEA-SO4: 101 ug/dL (ref 57.3–279.2)

## 2023-11-03 LAB — TESTOSTERONE,FREE AND TOTAL
Testosterone, Free: 1.7 pg/mL (ref 0.0–4.2)
Testosterone: 38 ng/dL (ref 4–50)

## 2023-11-05 ENCOUNTER — Other Ambulatory Visit (HOSPITAL_BASED_OUTPATIENT_CLINIC_OR_DEPARTMENT_OTHER): Payer: Self-pay

## 2023-11-05 ENCOUNTER — Encounter: Payer: Self-pay | Admitting: Dermatology

## 2023-11-05 ENCOUNTER — Ambulatory Visit (INDEPENDENT_AMBULATORY_CARE_PROVIDER_SITE_OTHER): Admitting: Dermatology

## 2023-11-05 VITALS — BP 121/79

## 2023-11-05 DIAGNOSIS — L900 Lichen sclerosus et atrophicus: Secondary | ICD-10-CM | POA: Diagnosis not present

## 2023-11-05 DIAGNOSIS — L649 Androgenic alopecia, unspecified: Secondary | ICD-10-CM

## 2023-11-05 MED ORDER — SAFETY SEAL MISCELLANEOUS MISC: 5 refills | Status: DC

## 2023-11-05 MED ORDER — TACROLIMUS 0.1 % EX OINT
1.0000 | TOPICAL_OINTMENT | Freq: Two times a day (BID) | CUTANEOUS | 3 refills | Status: DC
  Filled 2023-11-05: qty 100, 50d supply, fill #0
  Filled 2024-01-27: qty 100, 50d supply, fill #1
  Filled 2024-04-10: qty 100, 50d supply, fill #2

## 2023-11-05 NOTE — Patient Instructions (Addendum)
 Date: Mon Nov 05 2023  Hello Tara Kelley,  Thank you for visiting today. Here is a summary of the key instructions:  Medications: - Continue alternating between clobetasol and tacrolimus  creams for lichen sclerosis   - Use each cream for 3 weeks at a time   - Apply to all affected areas - Use a compounded solution of minoxidil and finasteride for hair loss   - Apply daily in the morning  Supplements: - Take Viviscal supplement for hair health - Take collagen supplement (such as Vital Proteins) for hair health  Follow-up: - Return for a follow-up appointment in 4 months  Other Instructions: - Continue seeing a gynecologist to track hormone changes over time - Expect to see new hair growth in the next 2-4 months  Please reach out if you have any questions or concerns.  Warm regards,  Dr. [facility]        OBGYN Referrals:   Lenis DOROTHA Sar, MD   Ezzie Marshall, MD   Important Information  Due to recent changes in healthcare laws, you may see results of your pathology and/or laboratory studies on MyChart before the doctors have had a chance to review them. We understand that in some cases there may be results that are confusing or concerning to you. Please understand that not all results are received at the same time and often the doctors may need to interpret multiple results in order to provide you with the best plan of care or course of treatment. Therefore, we ask that you please give us  2 business days to thoroughly review all your results before contacting the office for clarification. Should we see a critical lab result, you will be contacted sooner.   If You Need Anything After Your Visit  If you have any questions or concerns for your doctor, please call our main line at [phone] If no one answers, please leave a voicemail as directed and we will return your call as soon as possible. Messages left after 4 pm will be answered the following business day.    You may also send us  a message via MyChart. We typically respond to MyChart messages within 1-2 business days.  For prescription refills, please ask your pharmacy to contact our office. Our fax number is [phone].  If you have an urgent issue when the clinic is closed that cannot wait until the next business day, you can page your doctor at the number below.    Please note that while we do our best to be available for urgent issues outside of office hours, we are not available 24/7.   If you have an urgent issue and are unable to reach us , you may choose to seek medical care at your doctor's office, retail clinic, urgent care center, or emergency room.  If you have a medical emergency, please immediately call 911 or go to the emergency department. In the event of inclement weather, please call our main line at [phone] for an update on the status of any delays or closures.  Dermatology Medication Tips: Please keep the boxes that topical medications come in in order to help keep track of the instructions about where and how to use these. Pharmacies typically print the medication instructions only on the boxes and not directly on the medication tubes.   If your medication is too expensive, please contact our office at [phone] or send us  a message through MyChart.   We are unable to tell what your co-pay for medications  will be in advance as this is different depending on your insurance coverage. However, we may be able to find a substitute medication at lower cost or fill out paperwork to get insurance to cover a needed medication.   If a prior authorization is required to get your medication covered by your insurance company, please allow us  1-2 business days to complete this process.  Drug prices often vary depending on where the prescription is filled and some pharmacies may offer cheaper prices.  The website www.goodrx.com contains coupons for medications through different  pharmacies. The prices here do not account for what the cost may be with help from insurance (it may be cheaper with your insurance), but the website can give you the price if you did not use any insurance.  - You can print the associated coupon and take it with your prescription to the pharmacy.  - You may also stop by our office during regular business hours and pick up a GoodRx coupon card.  - If you need your prescription sent electronically to a different pharmacy, notify our office through [facility] or by phone at [phone]

## 2023-11-05 NOTE — Progress Notes (Signed)
 Follow-Up Visit   Subjective  Tara Kelley is a 43 y.o. female who presents for the following: Lichen Sclerosus - Alopecia  Patient present today for follow up visit. Patient was last evaluated on 08/06/23. At this visit patient was prescribed Tacrolimus  & Clobetasol alternating every 2 weeks. However, pt stated that she has only been applying clobetasol. Patient reports sxs are worse. Patient would like to discuss hair loss today as it was not throughly discussed at last OV. Patient reports medication changes.  Tara Kelley presents for follow-up of lichen sclerosis and hair thinning. She was last seen in March when a biopsy and lab work were performed, ruling out autoimmune conditions and confirming lichen sclerosis.  Regarding lichen sclerosis, the patient reports improvement in her symptoms. She states that the affected areas, including the vaginal region, feel better and are not as rough as before. The patient has been using topical therapy as prescribed, alternating between clobetasol and tacrolimus  creams for three-week periods each.  The patient also presents with concerns about hair thinning, consistent with androgenetic alopecia. She reports thinning at the temples, top, and vertex of the scalp, while the occipital region remains unaffected. The patient denies any history of polycystic ovary syndrome (PCOS) and reports having normal menstrual cycles. She was on birth control for 20 years but discontinued it 6-7 years ago. The patient has no children and is not trying to conceive.  The following portions of the chart were reviewed this encounter and updated as appropriate: medications, allergies, medical history  Review of Systems:  No other skin or systemic complaints except as noted in HPI or Assessment and Plan.  Objective  Well appearing patient in no apparent distress; mood and affect are within normal limits.   A focused examination was performed of the following areas:  scalp & groin    Relevant exam findings are noted in the Assessment and Plan.                   Assessment & Plan   ANDROGENETIC ALOPECIA (FEMALE PATTERN HAIR LOSS) Exam: Thinning of the temples, crown and vertex  Flared  Female Androgenic Alopecia is a chronic condition related to genetics and/or hormonal changes.  In women androgenetic alopecia is commonly associated with menopause but may occur any time after puberty.  It causes hair thinning primarily on the crown with widening of the part and temporal hairline recession.  - Assessment: Patient presents with classic androgenetic alopecia, characterized by thinning at the temples, top, and vertex of the scalp, with the occipital scalp unaffected. No history of PCOS, normal menstrual cycles, and previous use of birth control for 20 years (discontinued 6-7 years ago). Hormone levels are currently normal. The pattern of hair loss is consistent with hair follicles sensitive to hormones, influenced by genetics and age.  - Plan:    Prescribe compounded solution of minoxidil and finasteride for daily use     - Apply in the morning to avoid unwanted facial hair growth    Recommend supplements:     - Viviscal     - Collagen (e.g., Vital Proteins)    Follow up in 4 months to assess progress    Patient education:     - Advise patient to expect new growth starting as baby hairs that will thicken over time     - Recommend continued gynecological follow-up to track hormone changes over time  2. Lichen Sclerosis - Assessment: Patient was previously diagnosed with lichen sclerosis, confirmed  by biopsy and lab work which ruled out autoimmune conditions. Current examination shows improvement, with affected areas appearing smaller and less rough. The patient reports improved symptoms, particularly in the vaginal area.  - Plan:    Continue alternating topical therapy:     - Clobetasol 0.1% for 3 weeks     - Tacrolimus  for 3 weeks    Apply to  all affected areas    Follow up in 4 months to assess progress   LICHEN SCLEROSUS ET ATROPHICUS   Related Medications tacrolimus  (PROTOPIC ) 0.1 % ointment Apply 1 Application topically 2 (two) times daily. Use on the 2 week break from steroid cream.  Return in about 4 months (around 03/07/2024) for alopecia & lichen sclerosis .    Documentation: I have reviewed the above documentation for accuracy and completeness, and I agree with the above.  I, Shirron Maranda, CMA, am acting as scribe for [employer], DO.   Delon Lenis, DO

## 2023-11-07 ENCOUNTER — Other Ambulatory Visit: Payer: Self-pay

## 2023-11-07 ENCOUNTER — Other Ambulatory Visit (HOSPITAL_BASED_OUTPATIENT_CLINIC_OR_DEPARTMENT_OTHER): Payer: Self-pay

## 2023-11-09 ENCOUNTER — Other Ambulatory Visit (HOSPITAL_BASED_OUTPATIENT_CLINIC_OR_DEPARTMENT_OTHER): Payer: Self-pay

## 2023-11-20 ENCOUNTER — Other Ambulatory Visit: Payer: Self-pay

## 2023-11-20 ENCOUNTER — Other Ambulatory Visit (HOSPITAL_BASED_OUTPATIENT_CLINIC_OR_DEPARTMENT_OTHER): Payer: Self-pay

## 2023-11-21 ENCOUNTER — Other Ambulatory Visit (HOSPITAL_BASED_OUTPATIENT_CLINIC_OR_DEPARTMENT_OTHER): Payer: Self-pay

## 2023-11-23 ENCOUNTER — Encounter (HOSPITAL_BASED_OUTPATIENT_CLINIC_OR_DEPARTMENT_OTHER): Payer: Self-pay | Admitting: Family Medicine

## 2023-11-26 ENCOUNTER — Other Ambulatory Visit (HOSPITAL_BASED_OUTPATIENT_CLINIC_OR_DEPARTMENT_OTHER): Payer: Self-pay

## 2023-11-26 ENCOUNTER — Ambulatory Visit (INDEPENDENT_AMBULATORY_CARE_PROVIDER_SITE_OTHER): Admitting: Family Medicine

## 2023-11-26 ENCOUNTER — Encounter (INDEPENDENT_AMBULATORY_CARE_PROVIDER_SITE_OTHER): Payer: Self-pay | Admitting: Family Medicine

## 2023-11-26 ENCOUNTER — Other Ambulatory Visit (HOSPITAL_BASED_OUTPATIENT_CLINIC_OR_DEPARTMENT_OTHER): Payer: Self-pay | Admitting: *Deleted

## 2023-11-26 ENCOUNTER — Other Ambulatory Visit (HOSPITAL_BASED_OUTPATIENT_CLINIC_OR_DEPARTMENT_OTHER): Payer: Self-pay | Admitting: Family Medicine

## 2023-11-26 VITALS — BP 122/85 | HR 85 | Temp 97.6°F | Ht 62.0 in | Wt 274.0 lb

## 2023-11-26 DIAGNOSIS — Z6841 Body Mass Index (BMI) 40.0 and over, adult: Secondary | ICD-10-CM

## 2023-11-26 DIAGNOSIS — F5089 Other specified eating disorder: Secondary | ICD-10-CM

## 2023-11-26 DIAGNOSIS — E88819 Insulin resistance, unspecified: Secondary | ICD-10-CM

## 2023-11-26 DIAGNOSIS — R632 Polyphagia: Secondary | ICD-10-CM

## 2023-11-26 DIAGNOSIS — E559 Vitamin D deficiency, unspecified: Secondary | ICD-10-CM | POA: Diagnosis not present

## 2023-11-26 DIAGNOSIS — E785 Hyperlipidemia, unspecified: Secondary | ICD-10-CM

## 2023-11-26 DIAGNOSIS — E782 Mixed hyperlipidemia: Secondary | ICD-10-CM

## 2023-11-26 DIAGNOSIS — K219 Gastro-esophageal reflux disease without esophagitis: Secondary | ICD-10-CM | POA: Diagnosis not present

## 2023-11-26 DIAGNOSIS — I10 Essential (primary) hypertension: Secondary | ICD-10-CM

## 2023-11-26 DIAGNOSIS — F39 Unspecified mood [affective] disorder: Secondary | ICD-10-CM

## 2023-11-26 DIAGNOSIS — M25562 Pain in left knee: Secondary | ICD-10-CM

## 2023-11-26 DIAGNOSIS — E669 Obesity, unspecified: Secondary | ICD-10-CM

## 2023-11-26 MED ORDER — LOSARTAN POTASSIUM 100 MG PO TABS
100.0000 mg | ORAL_TABLET | Freq: Every day | ORAL | 2 refills | Status: AC
  Filled 2023-11-26: qty 90, 90d supply, fill #0
  Filled 2024-01-17 – 2024-03-02 (×2): qty 90, 90d supply, fill #1
  Filled 2024-04-07 – 2024-04-10 (×2): qty 90, 90d supply, fill #2

## 2023-11-26 MED ORDER — TIRZEPATIDE-WEIGHT MANAGEMENT 5 MG/0.5ML ~~LOC~~ SOAJ
5.0000 mg | SUBCUTANEOUS | 0 refills | Status: DC
  Filled 2023-11-26 – 2023-12-20 (×2): qty 2, 28d supply, fill #0

## 2023-11-26 MED ORDER — ROSUVASTATIN CALCIUM 5 MG PO TABS
5.0000 mg | ORAL_TABLET | Freq: Every day | ORAL | 1 refills | Status: DC
  Filled 2023-11-26 – 2023-12-02 (×2): qty 30, 30d supply, fill #0

## 2023-11-26 MED ORDER — OMEPRAZOLE 20 MG PO CPDR
20.0000 mg | DELAYED_RELEASE_CAPSULE | Freq: Every day | ORAL | 0 refills | Status: DC
  Filled 2023-11-26 – 2024-01-17 (×2): qty 90, 90d supply, fill #0

## 2023-11-26 MED ORDER — VITAMIN D (ERGOCALCIFEROL) 1.25 MG (50000 UNIT) PO CAPS
50000.0000 [IU] | ORAL_CAPSULE | ORAL | 0 refills | Status: DC
  Filled 2023-11-26: qty 5, 35d supply, fill #0

## 2023-11-26 MED ORDER — BUPROPION HCL ER (SR) 200 MG PO TB12
200.0000 mg | ORAL_TABLET | Freq: Two times a day (BID) | ORAL | 0 refills | Status: DC
  Filled 2023-11-26: qty 60, 30d supply, fill #0

## 2023-11-26 NOTE — Progress Notes (Signed)
 Office: [phone]  /  Fax: [phone]  WEIGHT SUMMARY AND BIOMETRICS  Anthropometric Measurements Height: 5' 2 (1.575 m) Weight: 274 lb (124.3 kg) BMI (Calculated): 50.1 Weight at Last Visit: 272 lb Weight Lost Since Last Visit: 0 Weight Gained Since Last Visit: 2 lb Starting Weight: 311 lb Total Weight Loss (lbs): 37 lb (16.8 kg) Peak Weight: 311 lb   Body Composition  Body Fat %: 51.5 % Fat Mass (lbs): 141.4 lbs Muscle Mass (lbs): 126.4 lbs Total Body Water (lbs): 95 lbs Visceral Fat Rating : 18   Other Clinical Data Fasting: yes Labs: no Today's Visit #: 41 Starting Date: 06/15/21    Chief Complaint: OBESITY   Discussed the use of AI scribe software for clinical note transcription with the patient, who gave verbal consent to proceed.  History of Present Illness Tara Kelley is a 43 year old female who presents for obesity treatment and progress assessment.  She is following a journaling plan with a daily intake of 1700 calories and 100 or more grams of protein, but adheres only about 50% of the time. She has gained two pounds since her last visit a month ago. She has not been able to exercise recently due to left knee pain, which has been a barrier to her weight loss efforts. She feels discouraged about her weight gain and the impact of her knee pain on her ability to exercise.  She is managing hyperlipidemia by cutting down on high cholesterol foods and reducing simple carbohydrates. She is currently taking Crestor  5 mg and requests a refill.  She has a history of insulin  resistance and is working on preventing diabetes through diet, exercise, and weight loss. She is on Zepbound  and requests a refill.  She has a history of vitamin D  deficiency and is on prescription vitamin D , which she reports as stable, and requests a refill.  She has a history of GERD, which is stable on Prilosec, and requests a refill.  She has a history of emotional eating  behaviors and is on Wellbutrin  to treat this diagnosis. She has been taking her second dose of bupropion  consistently.  She ran out of losartan  about a week ago, which has made her feel 'off'. She realized the medication was missing from her pill container and discovered it was not refilled. She feels anxious about her blood pressure and the potential for clogged arteries, especially given her family history of heart disease.  She reports poor sleep quality, primarily due to her dogs sharing her bed, which causes her to feel cramped and overheated. She describes her sleep as 'not really' good and acknowledges that her sleep setup is not conducive to restful sleep.  Her left knee has been hurting for about two to three weeks, which has prevented her from exercising. She had surgery on the same knee previously and is concerned about the pain. She has not been doing her strength exercises, which she believes helped in the past to alleviate the pain by engaging her quadriceps.      PHYSICAL EXAM:  Blood pressure 122/85, pulse 85, temperature 97.6 F (36.4 C), height 5' 2 (1.575 m), weight 274 lb (124.3 kg), last menstrual period 10/24/2023, SpO2 97%. Body mass index is 50.12 kg/m.  DIAGNOSTIC DATA REVIEWED:  BMET    Component Value Date/Time   NA 137 10/01/2023 0830   K 3.5 10/01/2023 0830   CL 98 10/01/2023 0830   CO2 22 10/01/2023 0830   GLUCOSE 72 10/01/2023 0830  GLUCOSE 103 (H) 07/22/2023 0559   BUN 13 10/01/2023 0830   CREATININE 0.91 10/01/2023 0830   CALCIUM  9.2 10/01/2023 0830   GFRNONAA >60 07/22/2023 0559   GFRAA >90 07/20/2014 2128   Lab Results  Component Value Date   HGBA1C 5.0 10/01/2023   HGBA1C 5.4 06/15/2021   Lab Results  Component Value Date   INSULIN  17.2 10/01/2023   INSULIN  20.6 06/15/2021   Lab Results  Component Value Date   TSH 1.700 10/01/2023   CBC    Component Value Date/Time   WBC 7.9 10/01/2023 0830   WBC 13.0 (H) 07/22/2023 0559   RBC  5.03 10/01/2023 0830   RBC 4.53 07/22/2023 0559   HGB 15.1 10/01/2023 0830   HCT 46.6 10/01/2023 0830   PLT 349 10/01/2023 0830   MCV 93 10/01/2023 0830   MCH 30.0 10/01/2023 0830   MCH 30.9 07/22/2023 0559   MCHC 32.4 10/01/2023 0830   MCHC 34.9 07/22/2023 0559   RDW 12.8 10/01/2023 0830   Iron Studies No results found for: IRON, TIBC, FERRITIN, IRONPCTSAT Lipid Panel     Component Value Date/Time   CHOL 245 (H) 10/01/2023 0830   TRIG 162 (H) 10/01/2023 0830   HDL 48 10/01/2023 0830   CHOLHDL 4.4 06/15/2021 1036   LDLCALC 167 (H) 10/01/2023 0830   Hepatic Function Panel     Component Value Date/Time   PROT 7.0 10/01/2023 0830   ALBUMIN 4.2 10/01/2023 0830   AST 18 10/01/2023 0830   ALT 14 10/01/2023 0830   ALKPHOS 112 10/01/2023 0830   BILITOT 0.5 10/01/2023 0830      Component Value Date/Time   TSH 1.700 10/01/2023 0830   Nutritional Lab Results  Component Value Date   VD25OH 54.3 10/01/2023   VD25OH 42.1 10/12/2022   VD25OH 39.7 02/20/2022     Assessment and Plan Assessment & Plan Left Knee Pain Left knee pain is preventing her from exercising. She has a history of knee surgery and suspects lack of strength exercises may contribute to the pain. Weight loss may reduce pressure on the knee but may not address the underlying issue. Evaluation by a specialist is recommended to explore options such as a steroid injection. She is encouraged to re-engage in quadriceps strengthening exercises to alleviate pain. - Refer to orthopedic specialist for knee evaluation - Consider steroid injection for knee pain management - Encourage quadriceps strengthening exercises  Obesity She is working on weight loss through a calorie-restricted diet and increased protein intake, adhering to the plan about 50% of the time. She has gained 2 pounds since the last visit, possibly due to water retention related to knee pain. She is discouraged by her weight gain and has not been  able to exercise due to left knee pain. Losing 10 pounds a month is unrealistic and gradual weight loss is emphasized. Meal prepping and focusing on small, sustainable changes are encouraged. She is advised to avoid unrealistic expectations and focus on gradual progress. - Continue 1700 calorie diet with 100+ grams of protein - Encourage meal prepping with focus on small portions - Discuss the importance of gradual weight loss and realistic goals  Insulin  Resistance She is managing insulin  resistance and working on preventing diabetes through diet, exercise, and weight loss. She is on Zepbound  to help with polyphagia associated with insulin  resistance. The importance of journaling and ensuring adequate protein intake to manage insulin  resistance is discussed. - Refill Zepbound  - Encourage journaling and adequate protein intake  Hyperlipidemia  She is managing hyperlipidemia with dietary changes, weight loss efforts, and Crestor  5 mg. Despite weight loss, her cholesterol levels have not improved significantly, likely due to hereditary factors. Maintaining cholesterol levels under 100 is crucial to prevent plaque buildup. Rechecking labs in September is advised to monitor progress and adjust treatment if necessary. - Refill Crestor  5 mg - Recheck cholesterol levels in September  Hypertension She ran out of losartan  100 mg about a week ago and feels off without it. Her blood pressure was surprisingly good during the visit. A refill is provided to bridge the gap until her other provider can take over. She is reassured about her blood pressure management and encouraged to continue monitoring. - Refill losartan  100 mg  Vitamin D  Deficiency She is on prescription vitamin D . A refill is requested and provided. - Refill prescription vitamin D   Gastroesophageal Reflux Disease (GERD) Her GERD is well-managed on Prilosec. A refill is requested and provided. - Refill Prilosec  Emotional Eating  Behaviors She is on Wellbutrin  to manage emotional eating behaviors. She reports being organized with her medication regimen and is taking her second dose of bupropion  consistently. She is encouraged to maintain her current regimen. - Refill Wellbutrin      I have personally spent 45 minutes total time today in preparation, patient care, and documentation for this visit, including the following: review of clinical lab tests; review of medical history, and nutrition counseling   She was informed of the importance of frequent follow up visits to maximize her success with intensive lifestyle modifications for her multiple health conditions.    Louann Penton, MD

## 2023-12-03 ENCOUNTER — Encounter (INDEPENDENT_AMBULATORY_CARE_PROVIDER_SITE_OTHER): Payer: Self-pay | Admitting: Family Medicine

## 2023-12-03 ENCOUNTER — Other Ambulatory Visit (HOSPITAL_BASED_OUTPATIENT_CLINIC_OR_DEPARTMENT_OTHER): Payer: Self-pay

## 2023-12-03 NOTE — Telephone Encounter (Signed)
 Called pt and she inform me that she has gotten her refills for her crestor . Pt verbalized understanding. Pt had no questions at this time but was encouraged to call back if questions arise.

## 2023-12-05 ENCOUNTER — Other Ambulatory Visit (HOSPITAL_BASED_OUTPATIENT_CLINIC_OR_DEPARTMENT_OTHER): Payer: Self-pay

## 2023-12-11 ENCOUNTER — Encounter (HOSPITAL_BASED_OUTPATIENT_CLINIC_OR_DEPARTMENT_OTHER): Payer: Self-pay | Admitting: Family Medicine

## 2023-12-11 NOTE — Telephone Encounter (Signed)
 Attempted to call pt about appt scheduled tomorrow 8/13 to clarify reason for appt but unable to reach. Left a detailed VM for pt and also have sent a mychart message. Will await response from pt.

## 2023-12-12 ENCOUNTER — Ambulatory Visit (HOSPITAL_BASED_OUTPATIENT_CLINIC_OR_DEPARTMENT_OTHER): Admitting: Family Medicine

## 2023-12-20 ENCOUNTER — Other Ambulatory Visit (HOSPITAL_BASED_OUTPATIENT_CLINIC_OR_DEPARTMENT_OTHER): Payer: Self-pay

## 2023-12-27 ENCOUNTER — Other Ambulatory Visit: Payer: Self-pay

## 2023-12-27 ENCOUNTER — Ambulatory Visit (INDEPENDENT_AMBULATORY_CARE_PROVIDER_SITE_OTHER): Admitting: Family Medicine

## 2023-12-27 ENCOUNTER — Other Ambulatory Visit (HOSPITAL_BASED_OUTPATIENT_CLINIC_OR_DEPARTMENT_OTHER): Payer: Self-pay

## 2023-12-27 ENCOUNTER — Encounter (INDEPENDENT_AMBULATORY_CARE_PROVIDER_SITE_OTHER): Payer: Self-pay | Admitting: Family Medicine

## 2023-12-27 VITALS — BP 120/82 | HR 89 | Temp 98.2°F | Ht 62.0 in | Wt 270.0 lb

## 2023-12-27 DIAGNOSIS — F32A Depression, unspecified: Secondary | ICD-10-CM | POA: Diagnosis not present

## 2023-12-27 DIAGNOSIS — E782 Mixed hyperlipidemia: Secondary | ICD-10-CM

## 2023-12-27 DIAGNOSIS — F39 Unspecified mood [affective] disorder: Secondary | ICD-10-CM

## 2023-12-27 DIAGNOSIS — R632 Polyphagia: Secondary | ICD-10-CM

## 2023-12-27 DIAGNOSIS — Z6841 Body Mass Index (BMI) 40.0 and over, adult: Secondary | ICD-10-CM

## 2023-12-27 DIAGNOSIS — F5089 Other specified eating disorder: Secondary | ICD-10-CM

## 2023-12-27 DIAGNOSIS — E559 Vitamin D deficiency, unspecified: Secondary | ICD-10-CM | POA: Diagnosis not present

## 2023-12-27 MED ORDER — VITAMIN D (ERGOCALCIFEROL) 1.25 MG (50000 UNIT) PO CAPS
50000.0000 [IU] | ORAL_CAPSULE | ORAL | 0 refills | Status: DC
  Filled 2023-12-27: qty 5, 35d supply, fill #0

## 2023-12-27 MED ORDER — TIRZEPATIDE-WEIGHT MANAGEMENT 5 MG/0.5ML ~~LOC~~ SOAJ
5.0000 mg | SUBCUTANEOUS | 0 refills | Status: DC
  Filled 2023-12-27 – 2024-01-17 (×2): qty 2, 28d supply, fill #0

## 2023-12-27 MED ORDER — BUPROPION HCL ER (SR) 200 MG PO TB12
200.0000 mg | ORAL_TABLET | Freq: Two times a day (BID) | ORAL | 0 refills | Status: DC
  Filled 2023-12-27: qty 60, 30d supply, fill #0

## 2023-12-27 MED ORDER — ROSUVASTATIN CALCIUM 5 MG PO TABS
5.0000 mg | ORAL_TABLET | Freq: Every day | ORAL | 1 refills | Status: DC
  Filled 2023-12-27 – 2023-12-31 (×2): qty 30, 30d supply, fill #0
  Filled 2024-01-17: qty 30, 30d supply, fill #1

## 2023-12-27 NOTE — Progress Notes (Signed)
 Office: [phone]  /  Fax: [phone]  WEIGHT SUMMARY AND BIOMETRICS  Anthropometric Measurements Height: 5' 2 (1.575 m) Weight: 270 lb (122.5 kg) BMI (Calculated): 49.37 Weight at Last Visit: 270 lb Weight Lost Since Last Visit: 4 lb Weight Gained Since Last Visit: 0 Starting Weight: 311 lb Total Weight Loss (lbs): 41 lb (18.6 kg) Peak Weight: 311 lb   Body Composition  Body Fat %: 51.5 % Fat Mass (lbs): 139.4 lbs Muscle Mass (lbs): 124.8 lbs Total Body Water (lbs): 93.8 lbs Visceral Fat Rating : 18   Other Clinical Data Fasting: yes Labs: no Today's Visit #: 42 Starting Date: 06/15/21    Chief Complaint: OBESITY    History of Present Illness Tara Kelley is a 43 year old female with obesity who presents for obesity treatment and progress assessment.  She adheres to a dietary plan of 1700 calories and 100 grams of protein daily, achieving this goal approximately 70% of the time. She has increased her physical activity by engaging in outdoor work, such as gardening, for a couple of hours, four days a week, resulting in a four-pound weight loss over the past month.  She is currently on Crestor  5 mg for hyperlipidemia, prescription vitamin D  50,000 IU weekly for vitamin D  deficiency, Wellbutrin  for emotional eating behaviors with a stable mood, and Zepbound  5 mg for polyphagia.  She experiences difficulty with sleep due to her dogs sleeping in her bed, causing her to feel hot and experience movement that disrupts her sleep. She is considering solutions to improve her sleep quality.  Her hunger is well-managed, and she is intentional about controlling it. After extensive yard work, she sometimes feels too hot to eat immediately and needs to cool down and rehydrate before eating.  She experienced a severe reaction after taking Viviscal on an empty stomach, resulting in vomiting and dry heaving.      PHYSICAL EXAM:  Blood pressure 120/82, pulse 89,  temperature 98.2 F (36.8 C), height 5' 2 (1.575 m), weight 270 lb (122.5 kg), SpO2 98%. Body mass index is 49.38 kg/m.  DIAGNOSTIC DATA REVIEWED:  BMET    Component Value Date/Time   NA 137 10/01/2023 0830   K 3.5 10/01/2023 0830   CL 98 10/01/2023 0830   CO2 22 10/01/2023 0830   GLUCOSE 72 10/01/2023 0830   GLUCOSE 103 (H) 07/22/2023 0559   BUN 13 10/01/2023 0830   CREATININE 0.91 10/01/2023 0830   CALCIUM  9.2 10/01/2023 0830   GFRNONAA >60 07/22/2023 0559   GFRAA >90 07/20/2014 2128   Lab Results  Component Value Date   HGBA1C 5.0 10/01/2023   HGBA1C 5.4 06/15/2021   Lab Results  Component Value Date   INSULIN  17.2 10/01/2023   INSULIN  20.6 06/15/2021   Lab Results  Component Value Date   TSH 1.700 10/01/2023   CBC    Component Value Date/Time   WBC 7.9 10/01/2023 0830   WBC 13.0 (H) 07/22/2023 0559   RBC 5.03 10/01/2023 0830   RBC 4.53 07/22/2023 0559   HGB 15.1 10/01/2023 0830   HCT 46.6 10/01/2023 0830   PLT 349 10/01/2023 0830   MCV 93 10/01/2023 0830   MCH 30.0 10/01/2023 0830   MCH 30.9 07/22/2023 0559   MCHC 32.4 10/01/2023 0830   MCHC 34.9 07/22/2023 0559   RDW 12.8 10/01/2023 0830   Iron Studies No results found for: IRON, TIBC, FERRITIN, IRONPCTSAT Lipid Panel     Component Value Date/Time   CHOL 245 (  H) 10/01/2023 0830   TRIG 162 (H) 10/01/2023 0830   HDL 48 10/01/2023 0830   CHOLHDL 4.4 06/15/2021 1036   LDLCALC 167 (H) 10/01/2023 0830   Hepatic Function Panel     Component Value Date/Time   PROT 7.0 10/01/2023 0830   ALBUMIN 4.2 10/01/2023 0830   AST 18 10/01/2023 0830   ALT 14 10/01/2023 0830   ALKPHOS 112 10/01/2023 0830   BILITOT 0.5 10/01/2023 0830      Component Value Date/Time   TSH 1.700 10/01/2023 0830   Nutritional Lab Results  Component Value Date   VD25OH 54.3 10/01/2023   VD25OH 42.1 10/12/2022   VD25OH 39.7 02/20/2022     Assessment and Plan Assessment & Plan Morbid obesity due to excess  calories with polyphagia and elevated BMI Morbid obesity with polyphagia, managed with a calorie-restricted diet and increased protein intake. Adherence to dietary goals is 70%. Engages in outdoor physical activities such as gardening four days a week. Weight loss of four pounds in the last month. - Continue dietary plan with 1700 calories and increased protein intake. - Encourage continued physical activity, including gardening. - Refill Zepbound  5 mg prescription.  Mixed hyperlipidemia Mixed hyperlipidemia managed with Crestor  5 mg. She is concerned about heart health and is motivated to reverse dietary damage through lifestyle changes. - Refill Crestor  5 mg prescription. - Plan to recheck labs in the fall, before the holidays. - Continue diet, exercise and weight loss as discussed today as an important part of the treatment plan  Vitamin D  deficiency Vitamin D  deficiency managed with prescription vitamin D  50,000 IU weekly. - Refill prescription vitamin D  50,000 IU weekly.  Depression with emotional eating behaviors Depression managed with Wellbutrin . Mood is well-managed. Wellbutrin  also addresses emotional eating behaviors. - Refill Wellbutrin  prescription.    She was informed of the importance of frequent follow up visits to maximize her success with intensive lifestyle modifications for her multiple health conditions.    Louann Penton, MD

## 2023-12-31 ENCOUNTER — Other Ambulatory Visit (HOSPITAL_BASED_OUTPATIENT_CLINIC_OR_DEPARTMENT_OTHER): Payer: Self-pay

## 2024-01-17 ENCOUNTER — Other Ambulatory Visit (HOSPITAL_BASED_OUTPATIENT_CLINIC_OR_DEPARTMENT_OTHER): Payer: Self-pay

## 2024-01-17 ENCOUNTER — Encounter (HOSPITAL_BASED_OUTPATIENT_CLINIC_OR_DEPARTMENT_OTHER): Payer: Self-pay

## 2024-01-17 ENCOUNTER — Other Ambulatory Visit: Payer: Self-pay

## 2024-01-17 ENCOUNTER — Other Ambulatory Visit (INDEPENDENT_AMBULATORY_CARE_PROVIDER_SITE_OTHER): Payer: Self-pay | Admitting: Family Medicine

## 2024-01-17 DIAGNOSIS — E559 Vitamin D deficiency, unspecified: Secondary | ICD-10-CM

## 2024-01-17 DIAGNOSIS — F39 Unspecified mood [affective] disorder: Secondary | ICD-10-CM

## 2024-01-21 ENCOUNTER — Telehealth (INDEPENDENT_AMBULATORY_CARE_PROVIDER_SITE_OTHER): Payer: Self-pay

## 2024-01-21 NOTE — Telephone Encounter (Signed)
 [facility] has not replied to your PA request. Turnaround time for review of a PA request is dependent upon insurance plan and can range from 24 hours to 5 calendar days.  You may close this dialog, return to your dashboard, and perform other tasks. To check for an update later, click on the "[employer] Detailed Status" located in the upper right corner of your dashboard.  If this search option is not available or [facility] has not replied to your request within this timeframe, please contact the number on the back of the patient's insurance card.

## 2024-01-21 NOTE — Telephone Encounter (Signed)
 Prior auth submitted for Zepbound  5 mg for obesity.  Awaiting determination.

## 2024-01-23 NOTE — Telephone Encounter (Addendum)
 Fax from [employer]:  Zepbound  5 mg/0.5 pen has been approved.   Approval dates:  01/22/2024-07/20/2024 Patient notified via my chart.   Confidential UM Information for: Member Name: Tara Kelley Date of Birth: 12/29/1980 Date Created: 01/21/2024 Reference Number: [id number] Medication: ZEPBOUND  5 MG/0.5 ML PEN Provider: Louann Penton *If your approval is for a brand/biologic agent, your authorization may be updated to a  generic/biosimilar if one becomes available on the market before your authorization expires. The medication you or your doctor asked us  to review is approved. Read on for important information. [facility] provides utilization management services for [employer]. Dear Louann Penton: Thank you for trusting us  with your health care coverage. Recently, you or your doctor  asked us  to review a request for the medication, ZEPBOUND  5 MG/0.5 ML PEN, and  the request has been approved. This approval is effective from 01/22/2024 until  07/20/2024. This approval means that, based on the information given to us , the  medication is considered medically necessary under your benefit plan. This approval is  for the coverage of ZEPBOUND  5 MG/0.5 ML PEN only.

## 2024-01-24 ENCOUNTER — Ambulatory Visit (INDEPENDENT_AMBULATORY_CARE_PROVIDER_SITE_OTHER): Admitting: Family Medicine

## 2024-01-24 ENCOUNTER — Other Ambulatory Visit (HOSPITAL_BASED_OUTPATIENT_CLINIC_OR_DEPARTMENT_OTHER): Payer: Self-pay

## 2024-01-24 ENCOUNTER — Other Ambulatory Visit: Payer: Self-pay

## 2024-01-24 ENCOUNTER — Encounter (INDEPENDENT_AMBULATORY_CARE_PROVIDER_SITE_OTHER): Payer: Self-pay | Admitting: Family Medicine

## 2024-01-24 VITALS — BP 95/70 | HR 90 | Temp 98.0°F | Ht 62.0 in | Wt 272.0 lb

## 2024-01-24 DIAGNOSIS — E669 Obesity, unspecified: Secondary | ICD-10-CM

## 2024-01-24 DIAGNOSIS — K219 Gastro-esophageal reflux disease without esophagitis: Secondary | ICD-10-CM | POA: Diagnosis not present

## 2024-01-24 DIAGNOSIS — F5089 Other specified eating disorder: Secondary | ICD-10-CM

## 2024-01-24 DIAGNOSIS — E559 Vitamin D deficiency, unspecified: Secondary | ICD-10-CM | POA: Diagnosis not present

## 2024-01-24 DIAGNOSIS — Z6841 Body Mass Index (BMI) 40.0 and over, adult: Secondary | ICD-10-CM

## 2024-01-24 DIAGNOSIS — F39 Unspecified mood [affective] disorder: Secondary | ICD-10-CM

## 2024-01-24 DIAGNOSIS — R632 Polyphagia: Secondary | ICD-10-CM

## 2024-01-24 DIAGNOSIS — E782 Mixed hyperlipidemia: Secondary | ICD-10-CM | POA: Diagnosis not present

## 2024-01-24 MED ORDER — VITAMIN D (ERGOCALCIFEROL) 1.25 MG (50000 UNIT) PO CAPS
50000.0000 [IU] | ORAL_CAPSULE | ORAL | 0 refills | Status: DC
  Filled 2024-01-24 – 2024-02-11 (×2): qty 12, 84d supply, fill #0

## 2024-01-24 MED ORDER — OMEPRAZOLE 20 MG PO CPDR
20.0000 mg | DELAYED_RELEASE_CAPSULE | Freq: Every day | ORAL | 0 refills | Status: DC
  Filled 2024-01-24 – 2024-02-11 (×3): qty 90, 90d supply, fill #0

## 2024-01-24 MED ORDER — TIRZEPATIDE-WEIGHT MANAGEMENT 5 MG/0.5ML ~~LOC~~ SOAJ
5.0000 mg | SUBCUTANEOUS | 0 refills | Status: DC
  Filled 2024-01-24: qty 2, 28d supply, fill #0

## 2024-01-24 MED ORDER — ROSUVASTATIN CALCIUM 5 MG PO TABS
5.0000 mg | ORAL_TABLET | Freq: Every day | ORAL | 0 refills | Status: DC
  Filled 2024-01-24 – 2024-01-27 (×2): qty 90, 90d supply, fill #0

## 2024-01-24 MED ORDER — BUPROPION HCL ER (SR) 200 MG PO TB12
200.0000 mg | ORAL_TABLET | Freq: Two times a day (BID) | ORAL | 0 refills | Status: DC
Start: 2024-01-24 — End: 2024-03-20
  Filled 2024-01-24: qty 180, 90d supply, fill #0

## 2024-01-24 NOTE — Progress Notes (Signed)
 Office: [phone]  /  Fax: [phone]  WEIGHT SUMMARY AND BIOMETRICS  Anthropometric Measurements Height: 5' 2 (1.575 m) Weight: 272 lb (123.4 kg) BMI (Calculated): 49.74 Weight at Last Visit: 270 lb Weight Lost Since Last Visit: 0 Weight Gained Since Last Visit: 2 lb Starting Weight: 311 lb Total Weight Loss (lbs): 39 lb (17.7 kg) Peak Weight: 311 lb   Body Composition  Body Fat %: 51.4 % Fat Mass (lbs): 139.8 lbs Muscle Mass (lbs): 125.6 lbs Total Body Water (lbs): 93.4 lbs Visceral Fat Rating : 18   Other Clinical Data Fasting: no Labs: no Today's Visit #: 37 Starting Date: 06/15/21    Chief Complaint: OBESITY    History of Present Illness Tara Kelley is a 43 year old female who presents for obesity treatment and assessment of progress.  She has gained two pounds in the last month and has not been exercising. She is currently on Wellbutrin  SR 200 mg BID for emotional eating behaviors and requests a refill. She is also on Zepbound  at a dose of 5 mg for polyphagia and has previously experienced side effects when increasing the dose.  She is being treated for hyperlipidemia and is on Crestor , in addition to working on diet, exercise, and weight loss. She requests a refill for Crestor .  She is on Prilosec 20 mg daily for GERD and reports stability with this treatment. She requests a refill for Prilosec.  She is on vitamin D , ergo calcifediol 50,000 IU prescription for vitamin D  deficiency and requests a refill.  She discusses challenges with nutrition, particularly in preparing and consuming vegetables. She mentions feeling scattered and busy, leading to food waste. She is considering purchasing pre-prepped vegetables to improve her intake. She also discusses her past success with meal prepping when she was on [employer] and wants to return to healthier eating habits.      PHYSICAL EXAM:  Blood pressure 95/70, pulse 90, temperature 98 F (36.7  C), height 5' 2 (1.575 m), weight 272 lb (123.4 kg), SpO2 97%. Body mass index is 49.75 kg/m.  DIAGNOSTIC DATA REVIEWED:  BMET    Component Value Date/Time   NA 137 10/01/2023 0830   K 3.5 10/01/2023 0830   CL 98 10/01/2023 0830   CO2 22 10/01/2023 0830   GLUCOSE 72 10/01/2023 0830   GLUCOSE 103 (H) 07/22/2023 0559   BUN 13 10/01/2023 0830   CREATININE 0.91 10/01/2023 0830   CALCIUM  9.2 10/01/2023 0830   GFRNONAA >60 07/22/2023 0559   GFRAA >90 07/20/2014 2128   Lab Results  Component Value Date   HGBA1C 5.0 10/01/2023   HGBA1C 5.4 06/15/2021   Lab Results  Component Value Date   INSULIN  17.2 10/01/2023   INSULIN  20.6 06/15/2021   Lab Results  Component Value Date   TSH 1.700 10/01/2023   CBC    Component Value Date/Time   WBC 7.9 10/01/2023 0830   WBC 13.0 (H) 07/22/2023 0559   RBC 5.03 10/01/2023 0830   RBC 4.53 07/22/2023 0559   HGB 15.1 10/01/2023 0830   HCT 46.6 10/01/2023 0830   PLT 349 10/01/2023 0830   MCV 93 10/01/2023 0830   MCH 30.0 10/01/2023 0830   MCH 30.9 07/22/2023 0559   MCHC 32.4 10/01/2023 0830   MCHC 34.9 07/22/2023 0559   RDW 12.8 10/01/2023 0830   Iron Studies No results found for: IRON, TIBC, FERRITIN, IRONPCTSAT Lipid Panel     Component Value Date/Time   CHOL 245 (H) 10/01/2023  0830   TRIG 162 (H) 10/01/2023 0830   HDL 48 10/01/2023 0830   CHOLHDL 4.4 06/15/2021 1036   LDLCALC 167 (H) 10/01/2023 0830   Hepatic Function Panel     Component Value Date/Time   PROT 7.0 10/01/2023 0830   ALBUMIN 4.2 10/01/2023 0830   AST 18 10/01/2023 0830   ALT 14 10/01/2023 0830   ALKPHOS 112 10/01/2023 0830   BILITOT 0.5 10/01/2023 0830      Component Value Date/Time   TSH 1.700 10/01/2023 0830   Nutritional Lab Results  Component Value Date   VD25OH 54.3 10/01/2023   VD25OH 42.1 10/12/2022   VD25OH 39.7 02/20/2022     Assessment and Plan Assessment & Plan 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 Currently on Zepbound  at a low dose of 5 mg. Recent weight gain of 2 pounds over the last month. No recent gastrointestinal issues reported. - Increase Zepbound  dose from 5 mg to 7.5 mg to enhance weight loss and potentially improve sleep apnea. - Consider alternating doses between 5 mg and 7.5 mg if concerned about side effects. - Encourage consumption of protein and vegetables, utilizing pre-prepped and convenience options as needed. - Provide slow cooker recipes for meal preparation. - Refill Wellbutrin  SR 200 mg BID for emotional eating behaviors.  Mixed hyperlipidemia Mixed hyperlipidemia currently managed with Crestor . - Refill Crestor  prescription. - Emphasize diet, exercise, and weight loss as part of the management plan.  Gastroesophageal reflux disease (GERD) GERD is well-managed on current medication regimen. - Refill Prilosec 20 mg daily.  Vitamin D  deficiency Vitamin D  deficiency managed with ergocalciferol  50,000 IU prescription. - Refill ergocalciferol  50,000 IU prescription.      Tara Kelley was informed of the importance of frequent follow up visits to maximize her success with intensive lifestyle modifications for her obesity and obesity related health conditions as recommended by USPSTF and CMS guidelines   Louann Penton, MD

## 2024-01-27 ENCOUNTER — Other Ambulatory Visit: Payer: Self-pay

## 2024-01-27 ENCOUNTER — Other Ambulatory Visit (HOSPITAL_BASED_OUTPATIENT_CLINIC_OR_DEPARTMENT_OTHER): Payer: Self-pay

## 2024-01-28 ENCOUNTER — Other Ambulatory Visit (HOSPITAL_BASED_OUTPATIENT_CLINIC_OR_DEPARTMENT_OTHER): Payer: Self-pay

## 2024-01-28 ENCOUNTER — Other Ambulatory Visit: Payer: Self-pay

## 2024-02-11 ENCOUNTER — Other Ambulatory Visit (HOSPITAL_BASED_OUTPATIENT_CLINIC_OR_DEPARTMENT_OTHER): Payer: Self-pay | Admitting: Family Medicine

## 2024-02-11 ENCOUNTER — Other Ambulatory Visit (HOSPITAL_BASED_OUTPATIENT_CLINIC_OR_DEPARTMENT_OTHER): Payer: Self-pay

## 2024-02-11 ENCOUNTER — Other Ambulatory Visit (INDEPENDENT_AMBULATORY_CARE_PROVIDER_SITE_OTHER): Payer: Self-pay | Admitting: Family Medicine

## 2024-02-11 ENCOUNTER — Other Ambulatory Visit: Payer: Self-pay

## 2024-02-11 ENCOUNTER — Encounter (HOSPITAL_BASED_OUTPATIENT_CLINIC_OR_DEPARTMENT_OTHER): Payer: Self-pay | Admitting: Family Medicine

## 2024-02-11 DIAGNOSIS — E782 Mixed hyperlipidemia: Secondary | ICD-10-CM

## 2024-02-11 DIAGNOSIS — F39 Unspecified mood [affective] disorder: Secondary | ICD-10-CM

## 2024-02-11 MED ORDER — CITALOPRAM HYDROBROMIDE 20 MG PO TABS
30.0000 mg | ORAL_TABLET | Freq: Every day | ORAL | 2 refills | Status: AC
  Filled 2024-02-11: qty 90, 60d supply, fill #0
  Filled 2024-04-07 – 2024-04-10 (×3): qty 90, 60d supply, fill #1

## 2024-02-11 NOTE — Telephone Encounter (Signed)
 Please see mychart message sent by pt and advise.

## 2024-02-12 ENCOUNTER — Other Ambulatory Visit (HOSPITAL_BASED_OUTPATIENT_CLINIC_OR_DEPARTMENT_OTHER): Payer: Self-pay

## 2024-02-21 ENCOUNTER — Ambulatory Visit (INDEPENDENT_AMBULATORY_CARE_PROVIDER_SITE_OTHER): Admitting: Family Medicine

## 2024-02-21 ENCOUNTER — Other Ambulatory Visit (HOSPITAL_BASED_OUTPATIENT_CLINIC_OR_DEPARTMENT_OTHER): Payer: Self-pay

## 2024-02-21 ENCOUNTER — Other Ambulatory Visit: Payer: Self-pay

## 2024-02-21 ENCOUNTER — Encounter (INDEPENDENT_AMBULATORY_CARE_PROVIDER_SITE_OTHER): Payer: Self-pay | Admitting: Family Medicine

## 2024-02-21 VITALS — BP 117/80 | HR 89 | Temp 97.8°F | Ht 62.0 in | Wt 270.0 lb

## 2024-02-21 DIAGNOSIS — E559 Vitamin D deficiency, unspecified: Secondary | ICD-10-CM | POA: Diagnosis not present

## 2024-02-21 DIAGNOSIS — R632 Polyphagia: Secondary | ICD-10-CM

## 2024-02-21 DIAGNOSIS — Z6841 Body Mass Index (BMI) 40.0 and over, adult: Secondary | ICD-10-CM | POA: Diagnosis not present

## 2024-02-21 DIAGNOSIS — E669 Obesity, unspecified: Secondary | ICD-10-CM | POA: Diagnosis not present

## 2024-02-21 MED ORDER — TIRZEPATIDE-WEIGHT MANAGEMENT 7.5 MG/0.5ML ~~LOC~~ SOAJ
7.5000 mg | SUBCUTANEOUS | 0 refills | Status: DC
  Filled 2024-02-21: qty 2, 28d supply, fill #0

## 2024-02-21 MED ORDER — VITAMIN D (ERGOCALCIFEROL) 1.25 MG (50000 UNIT) PO CAPS
50000.0000 [IU] | ORAL_CAPSULE | ORAL | 0 refills | Status: DC
Start: 2024-02-21 — End: 2024-03-20
  Filled 2024-02-21: qty 12, 84d supply, fill #0

## 2024-02-21 NOTE — Progress Notes (Signed)
 Office: [phone]  /  Fax: [phone]  WEIGHT SUMMARY AND BIOMETRICS  Anthropometric Measurements Height: 5' 2 (1.575 m) Weight: 270 lb (122.5 kg) BMI (Calculated): 49.37 Weight at Last Visit: 272 lb Weight Lost Since Last Visit: 2 lb Weight Gained Since Last Visit: 0 Starting Weight: 311 lb Total Weight Loss (lbs): 41 lb (18.6 kg) Peak Weight: 311 lb   Body Composition  Body Fat %: 51.1 % Fat Mass (lbs): 138 lbs Muscle Mass (lbs): 125.6 lbs Total Body Water (lbs): 94.4 lbs Visceral Fat Rating : 17   Other Clinical Data Fasting: yes Labs: no Today's Visit #: 72 Starting Date: 06/15/21    Chief Complaint: OBESITY     History of Present Illness Tara Kelley is a 43 year old female with obesity and polyphagia who presents for obesity treatment and progress assessment.  She is adhering to a dietary plan targeting 1700 calories and 100 grams of protein daily, achieving this approximately 60% of the time. Her diet includes increased fruits and vegetables, consistent protein intake, and she avoids skipping meals. She generally sleeps seven or more hours most nights but is not currently engaging in physical exercise.  She has a diagnosis of polyphagia, separate from obesity, and is being treated with Zepbound . Her dosage was previously 5 mg, which was increased to alternate between 5 mg and 7.5 mg. She experiences adverse effects such as burping, bad taste in her mouth, and lack of hunger, particularly on the second day after taking the medication, though she did not vomit. She notes sensitivity to the medication.  She has a history of vitamin D  deficiency and is on prescription ergocalciferol  50,000 IU per week. She requests a refill for this medication.  She is on Celexa  and has concerns about alcohol consumption due to her medications. She has not consumed alcohol in over a year and is cautious about potential interactions. She also has a prescription for  Compazine , which she uses for nausea related to plane travel, and Zofran , which she does not use regularly.  Her mother will be visiting in November, and she notes that her mother prefers eating fruits and vegetables and does not like eating out. She recalls losing weight during her mother's last visit. [place] with limited access to fast food.      PHYSICAL EXAM:  Blood pressure 117/80, pulse 89, temperature 97.8 F (36.6 C), height 5' 2 (1.575 m), weight 270 lb (122.5 kg), SpO2 100%. Body mass index is 49.38 kg/m.  DIAGNOSTIC DATA REVIEWED:  BMET    Component Value Date/Time   NA 137 10/01/2023 0830   K 3.5 10/01/2023 0830   CL 98 10/01/2023 0830   CO2 22 10/01/2023 0830   GLUCOSE 72 10/01/2023 0830   GLUCOSE 103 (H) 07/22/2023 0559   BUN 13 10/01/2023 0830   CREATININE 0.91 10/01/2023 0830   CALCIUM  9.2 10/01/2023 0830   GFRNONAA >60 07/22/2023 0559   GFRAA >90 07/20/2014 2128   Lab Results  Component Value Date   HGBA1C 5.0 10/01/2023   HGBA1C 5.4 06/15/2021   Lab Results  Component Value Date   INSULIN  17.2 10/01/2023   INSULIN  20.6 06/15/2021   Lab Results  Component Value Date   TSH 1.700 10/01/2023   CBC    Component Value Date/Time   WBC 7.9 10/01/2023 0830   WBC 13.0 (H) 07/22/2023 0559   RBC 5.03 10/01/2023 0830   RBC 4.53 07/22/2023 0559   HGB 15.1  10/01/2023 0830   HCT 46.6 10/01/2023 0830   PLT 349 10/01/2023 0830   MCV 93 10/01/2023 0830   MCH 30.0 10/01/2023 0830   MCH 30.9 07/22/2023 0559   MCHC 32.4 10/01/2023 0830   MCHC 34.9 07/22/2023 0559   RDW 12.8 10/01/2023 0830   Iron Studies No results found for: IRON, TIBC, FERRITIN, IRONPCTSAT Lipid Panel     Component Value Date/Time   CHOL 245 (H) 10/01/2023 0830   TRIG 162 (H) 10/01/2023 0830   HDL 48 10/01/2023 0830   CHOLHDL 4.4 06/15/2021 1036   LDLCALC 167 (H) 10/01/2023 0830   Hepatic Function Panel     Component Value Date/Time   PROT 7.0  10/01/2023 0830   ALBUMIN 4.2 10/01/2023 0830   AST 18 10/01/2023 0830   ALT 14 10/01/2023 0830   ALKPHOS 112 10/01/2023 0830   BILITOT 0.5 10/01/2023 0830      Component Value Date/Time   TSH 1.700 10/01/2023 0830   Nutritional Lab Results  Component Value Date   VD25OH 54.3 10/01/2023   VD25OH 42.1 10/12/2022   VD25OH 39.7 02/20/2022     Assessment and Plan Assessment & Plan Obesity Obesity management is ongoing with dietary changes and medication. She follows a journaling plan with a caloric intake of 1700 calories and 100 grams of protein, meeting these requirements about 60% of the time. She consumes more fruits and vegetables, gets the recommended amount of protein, and does not skip meals. She is not currently exercising. She experiences side effects from Zepbound , particularly on the second day after dosing, including burping, bad taste, and nausea, but no vomiting. Alcohol consumption is minimal. - Continue dietary plan with 1700 calories and 100 grams of protein. - Alternate Zepbound  dosing between 5 mg and 7.5 mg if side effects persist. - Advise on small, frequent meals to manage nausea. - Provide information on alcohol's caloric content and effects on hunger.  Polyphagia Polyphagia is managed with Zepbound . The current dosing strategy involves alternating between 5 mg and 7.5 mg. She reports sensitivity to the medication, particularly on the second day after dosing, with symptoms of nausea and bad taste. - Monitor symptoms and adjust Zepbound  dosing if necessary.  Vitamin D  deficiency Vitamin D  deficiency is well-managed with ongoing treatment. She is on ergocalciferol  50,000 IU weekly and requests a refill. - Refill ergocalciferol  50,000 IU weekly for 90 days.     I personally spent a total of 45 minutes in the care of the patient today including preparing to see the patient, performing a medically appropriate evaluation of current problems, placing orders in the  EMR, documenting clinical information in the EMR, and customized nutritional counseling for their specific health and social needs.    Jamell was informed of the importance of frequent follow up visits to maximize her success with intensive lifestyle modifications for her obesity and obesity related health conditions as recommended by USPSTF and CMS guidelines   Louann Penton, MD

## 2024-02-23 ENCOUNTER — Other Ambulatory Visit (HOSPITAL_BASED_OUTPATIENT_CLINIC_OR_DEPARTMENT_OTHER): Payer: Self-pay

## 2024-02-26 ENCOUNTER — Encounter (HOSPITAL_BASED_OUTPATIENT_CLINIC_OR_DEPARTMENT_OTHER): Payer: Self-pay

## 2024-02-26 ENCOUNTER — Other Ambulatory Visit (HOSPITAL_BASED_OUTPATIENT_CLINIC_OR_DEPARTMENT_OTHER): Payer: Self-pay

## 2024-03-03 ENCOUNTER — Other Ambulatory Visit (HOSPITAL_BASED_OUTPATIENT_CLINIC_OR_DEPARTMENT_OTHER): Payer: Self-pay

## 2024-03-03 DIAGNOSIS — Z1231 Encounter for screening mammogram for malignant neoplasm of breast: Secondary | ICD-10-CM | POA: Diagnosis not present

## 2024-03-03 LAB — HM MAMMOGRAPHY

## 2024-03-03 MED ORDER — FLUZONE 0.5 ML IM SUSY
0.5000 mL | PREFILLED_SYRINGE | Freq: Once | INTRAMUSCULAR | 0 refills | Status: AC
  Filled 2024-03-03: qty 0.5, 1d supply, fill #0

## 2024-03-03 MED ORDER — COMIRNATY 30 MCG/0.3ML IM SUSY
0.3000 mL | PREFILLED_SYRINGE | Freq: Once | INTRAMUSCULAR | 0 refills | Status: AC
Start: 2024-03-03 — End: 2024-03-04
  Filled 2024-03-03: qty 0.3, 1d supply, fill #0

## 2024-03-04 ENCOUNTER — Encounter: Payer: Self-pay | Admitting: Family Medicine

## 2024-03-20 ENCOUNTER — Other Ambulatory Visit: Payer: Self-pay

## 2024-03-20 ENCOUNTER — Encounter (INDEPENDENT_AMBULATORY_CARE_PROVIDER_SITE_OTHER): Payer: Self-pay | Admitting: Family Medicine

## 2024-03-20 ENCOUNTER — Ambulatory Visit (INDEPENDENT_AMBULATORY_CARE_PROVIDER_SITE_OTHER): Payer: Self-pay | Admitting: Family Medicine

## 2024-03-20 ENCOUNTER — Other Ambulatory Visit (HOSPITAL_BASED_OUTPATIENT_CLINIC_OR_DEPARTMENT_OTHER): Payer: Self-pay

## 2024-03-20 VITALS — BP 116/87 | HR 83 | Temp 97.6°F | Ht 62.0 in | Wt 269.0 lb

## 2024-03-20 DIAGNOSIS — E559 Vitamin D deficiency, unspecified: Secondary | ICD-10-CM | POA: Diagnosis not present

## 2024-03-20 DIAGNOSIS — Z6841 Body Mass Index (BMI) 40.0 and over, adult: Secondary | ICD-10-CM

## 2024-03-20 DIAGNOSIS — F39 Unspecified mood [affective] disorder: Secondary | ICD-10-CM

## 2024-03-20 DIAGNOSIS — K219 Gastro-esophageal reflux disease without esophagitis: Secondary | ICD-10-CM | POA: Diagnosis not present

## 2024-03-20 DIAGNOSIS — E782 Mixed hyperlipidemia: Secondary | ICD-10-CM | POA: Diagnosis not present

## 2024-03-20 DIAGNOSIS — E66813 Obesity, class 3: Secondary | ICD-10-CM

## 2024-03-20 DIAGNOSIS — F5089 Other specified eating disorder: Secondary | ICD-10-CM

## 2024-03-20 MED ORDER — BUPROPION HCL ER (SR) 200 MG PO TB12
200.0000 mg | ORAL_TABLET | Freq: Two times a day (BID) | ORAL | 0 refills | Status: DC
  Filled 2024-03-20 – 2024-04-08 (×3): qty 180, 90d supply, fill #0

## 2024-03-20 MED ORDER — VITAMIN D (ERGOCALCIFEROL) 1.25 MG (50000 UNIT) PO CAPS
50000.0000 [IU] | ORAL_CAPSULE | ORAL | 0 refills | Status: DC
  Filled 2024-03-20 – 2024-04-07 (×2): qty 12, 84d supply, fill #0

## 2024-03-20 MED ORDER — TIRZEPATIDE-WEIGHT MANAGEMENT 7.5 MG/0.5ML ~~LOC~~ SOAJ
7.5000 mg | SUBCUTANEOUS | 0 refills | Status: DC
  Filled 2024-03-20: qty 2, 28d supply, fill #0

## 2024-03-20 MED ORDER — ROSUVASTATIN CALCIUM 5 MG PO TABS
5.0000 mg | ORAL_TABLET | Freq: Every day | ORAL | 0 refills | Status: DC
  Filled 2024-03-20 – 2024-04-07 (×2): qty 90, 90d supply, fill #0

## 2024-03-20 MED ORDER — OMEPRAZOLE 20 MG PO CPDR
20.0000 mg | DELAYED_RELEASE_CAPSULE | Freq: Every day | ORAL | 0 refills | Status: DC
  Filled 2024-03-20 – 2024-04-07 (×2): qty 90, 90d supply, fill #0

## 2024-03-20 NOTE — Progress Notes (Signed)
 Office: [phone]  /  Fax: [phone]  WEIGHT SUMMARY AND BIOMETRICS  Anthropometric Measurements Height: 5' 2 (1.575 m) Weight: 269 lb (122 kg) BMI (Calculated): 49.19 Weight at Last Visit: 270 lb Weight Lost Since Last Visit: 1 lb Weight Gained Since Last Visit: 0 Starting Weight: 311 lb Total Weight Loss (lbs): 42 lb (19.1 kg) Peak Weight: 311 lb   Body Composition  Body Fat %: 51.3 % Fat Mass (lbs): 138 lbs Muscle Mass (lbs): 124.6 lbs Total Body Water (lbs): 94 lbs Visceral Fat Rating : 17   Other Clinical Data Fasting: yes Labs: no Today's Visit #: 44 Starting Date: 06/15/21    Chief Complaint: OBESITY    History of Present Illness Tara Kelley is a 43 year old female with obesity who presents for a follow-up on her obesity treatment plan and progress assessment.  She is adhering to a journaling plan with a daily intake of 1700 calories and over 100 grams of protein, maintaining this about 70% of the time. She exercises three days a week for 45 minutes, combining walking and strength training. She has lost one pound in the last month, totaling a 42-pound weight loss since starting the program. She is increasing her intake of vegetables and fruits and uses Premier Protein shakes, particularly enjoying the chocolate mint flavor, to meet her protein goals.  She has a history of hyperlipidemia and requests a refill of Crestor  5 mg. She has a history of GERD, reports stability on her current regimen, and requests a refill of Prilosec 20 mg. She has a history of vitamin D  deficiency and requests a refill of prescription ergocalciferol  50,000 IU weekly. She has a diagnosis of emotional eating behavior and requests a refill of Wellbutrin  SR 200 mg twice daily.  She experiences significant side effects from Zepbound , including diarrhea and severe reflux, particularly on the second day after administration. She also had a reaction to her recent flu and COVID  vaccinations, including a day of feeling unwell with symptoms like a 'fishbowl' head and body aches, followed by a return to normal the next day.  She is planning to travel to [place] for Christmas with her mother and dog, Tara Kelley, and is considering strategies to manage her diet during the holidays.      PHYSICAL EXAM:  Blood pressure 116/87, pulse 83, temperature 97.6 F (36.4 C), height 5' 2 (1.575 m), weight 269 lb (122 kg), SpO2 99%. Body mass index is 49.2 kg/m.  DIAGNOSTIC DATA REVIEWED:  BMET    Component Value Date/Time   NA 137 10/01/2023 0830   K 3.5 10/01/2023 0830   CL 98 10/01/2023 0830   CO2 22 10/01/2023 0830   GLUCOSE 72 10/01/2023 0830   GLUCOSE 103 (H) 07/22/2023 0559   BUN 13 10/01/2023 0830   CREATININE 0.91 10/01/2023 0830   CALCIUM  9.2 10/01/2023 0830   GFRNONAA >60 07/22/2023 0559   GFRAA >90 07/20/2014 2128   Lab Results  Component Value Date   HGBA1C 5.0 10/01/2023   HGBA1C 5.4 06/15/2021   Lab Results  Component Value Date   INSULIN  17.2 10/01/2023   INSULIN  20.6 06/15/2021   Lab Results  Component Value Date   TSH 1.700 10/01/2023   CBC    Component Value Date/Time   WBC 7.9 10/01/2023 0830   WBC 13.0 (H) 07/22/2023 0559   RBC 5.03 10/01/2023 0830   RBC 4.53 07/22/2023 0559   HGB 15.1 10/01/2023 0830   HCT 46.6 10/01/2023 0830  PLT 349 10/01/2023 0830   MCV 93 10/01/2023 0830   MCH 30.0 10/01/2023 0830   MCH 30.9 07/22/2023 0559   MCHC 32.4 10/01/2023 0830   MCHC 34.9 07/22/2023 0559   RDW 12.8 10/01/2023 0830   Iron Studies No results found for: IRON, TIBC, FERRITIN, IRONPCTSAT Lipid Panel     Component Value Date/Time   CHOL 245 (H) 10/01/2023 0830   TRIG 162 (H) 10/01/2023 0830   HDL 48 10/01/2023 0830   CHOLHDL 4.4 06/15/2021 1036   LDLCALC 167 (H) 10/01/2023 0830   Hepatic Function Panel     Component Value Date/Time   PROT 7.0 10/01/2023 0830   ALBUMIN 4.2 10/01/2023 0830   AST 18 10/01/2023 0830    ALT 14 10/01/2023 0830   ALKPHOS 112 10/01/2023 0830   BILITOT 0.5 10/01/2023 0830      Component Value Date/Time   TSH 1.700 10/01/2023 0830   Nutritional Lab Results  Component Value Date   VD25OH 54.3 10/01/2023   VD25OH 42.1 10/12/2022   VD25OH 39.7 02/20/2022     Assessment and Plan Assessment & Plan Class 3 obesity She has lost 1 pound in the last month, totaling 42 pounds since starting the program. She adheres to a 1700 calorie diet with 100 grams of protein about 70% of the time and exercises three days a week for 45 minutes, combining walking and strength training. She is aware of the challenges of maintaining her diet during the holidays, particularly with sweets, and plans to stay on her medications and focus on protein intake to manage her weight. - Continue current diet and exercise regimen. - Maintain medication adherence. - Focus on protein intake to manage weight. - Plan for holiday challenges with sweets and grazing.  Mixed hyperlipidemia Managed with diet, exercise, weight loss, and Crestor  5 mg. She requests a refill of Crestor . - Refilled Crestor  5 mg.  Gastroesophageal reflux disease (GERD) GERD is well-managed with Prilosec 20 mg daily. She requests a refill. - Refilled Prilosec 20 mg daily.  Vitamin D  deficiency Managed with prescription ergocalciferol  50,000 IU weekly. She requests a refill. - Refilled ergocalciferol  50,000 IU weekly.  Emotional eating behavior Managed with Wellbutrin  SR 200 mg twice a day. She requests a refill. - Refilled Wellbutrin  SR 200 mg twice a day.      Patients who are on anti-obesity medications are counseled on the importance of maintaining healthy lifestyle habits, including balanced nutrition, regular physical activity, and behavioral modifications,  Medication is an adjunct to, not a replacement for, lifestyle changes and that the long-term success and weight maintenance depend on continued adherence to these  strategies.   Tara Kelley was informed of the importance of frequent follow up visits to maximize her success with intensive lifestyle modifications for her obesity and obesity related health conditions as recommended by USPSTF and CMS guidelines Louann Penton, MD

## 2024-04-07 ENCOUNTER — Other Ambulatory Visit (INDEPENDENT_AMBULATORY_CARE_PROVIDER_SITE_OTHER): Payer: Self-pay | Admitting: Family Medicine

## 2024-04-07 DIAGNOSIS — Z6841 Body Mass Index (BMI) 40.0 and over, adult: Secondary | ICD-10-CM

## 2024-04-08 ENCOUNTER — Other Ambulatory Visit (HOSPITAL_BASED_OUTPATIENT_CLINIC_OR_DEPARTMENT_OTHER): Payer: Self-pay

## 2024-04-08 ENCOUNTER — Other Ambulatory Visit: Payer: Self-pay

## 2024-04-09 ENCOUNTER — Ambulatory Visit (INDEPENDENT_AMBULATORY_CARE_PROVIDER_SITE_OTHER): Payer: Self-pay | Admitting: Family Medicine

## 2024-04-09 ENCOUNTER — Encounter (INDEPENDENT_AMBULATORY_CARE_PROVIDER_SITE_OTHER): Payer: Self-pay | Admitting: Family Medicine

## 2024-04-09 ENCOUNTER — Other Ambulatory Visit (HOSPITAL_BASED_OUTPATIENT_CLINIC_OR_DEPARTMENT_OTHER): Payer: Self-pay

## 2024-04-09 VITALS — BP 121/83 | HR 65 | Temp 98.0°F | Ht 62.0 in | Wt 266.0 lb

## 2024-04-09 DIAGNOSIS — Z6841 Body Mass Index (BMI) 40.0 and over, adult: Secondary | ICD-10-CM

## 2024-04-09 DIAGNOSIS — E559 Vitamin D deficiency, unspecified: Secondary | ICD-10-CM | POA: Diagnosis not present

## 2024-04-09 DIAGNOSIS — K219 Gastro-esophageal reflux disease without esophagitis: Secondary | ICD-10-CM | POA: Diagnosis not present

## 2024-04-09 DIAGNOSIS — F39 Unspecified mood [affective] disorder: Secondary | ICD-10-CM

## 2024-04-09 DIAGNOSIS — F5089 Other specified eating disorder: Secondary | ICD-10-CM | POA: Diagnosis not present

## 2024-04-09 DIAGNOSIS — E782 Mixed hyperlipidemia: Secondary | ICD-10-CM | POA: Diagnosis not present

## 2024-04-09 DIAGNOSIS — E66813 Obesity, class 3: Secondary | ICD-10-CM | POA: Diagnosis not present

## 2024-04-09 MED ORDER — VITAMIN D (ERGOCALCIFEROL) 1.25 MG (50000 UNIT) PO CAPS
50000.0000 [IU] | ORAL_CAPSULE | ORAL | 0 refills | Status: AC
  Filled 2024-04-09 – 2024-05-27 (×3): qty 12, 84d supply, fill #0

## 2024-04-09 MED ORDER — TIRZEPATIDE-WEIGHT MANAGEMENT 7.5 MG/0.5ML ~~LOC~~ SOAJ
7.5000 mg | SUBCUTANEOUS | 0 refills | Status: DC
  Filled 2024-04-09 – 2024-04-30 (×3): qty 2, 28d supply, fill #0

## 2024-04-09 MED ORDER — BUPROPION HCL ER (SR) 200 MG PO TB12
200.0000 mg | ORAL_TABLET | Freq: Two times a day (BID) | ORAL | 0 refills | Status: AC
  Filled 2024-04-09 – 2024-04-10 (×2): qty 180, 90d supply, fill #0

## 2024-04-09 MED ORDER — ROSUVASTATIN CALCIUM 5 MG PO TABS
5.0000 mg | ORAL_TABLET | Freq: Every day | ORAL | 0 refills | Status: AC
  Filled 2024-04-09 – 2024-04-30 (×3): qty 90, 90d supply, fill #0

## 2024-04-09 MED ORDER — OMEPRAZOLE 20 MG PO CPDR
20.0000 mg | DELAYED_RELEASE_CAPSULE | Freq: Every day | ORAL | 0 refills | Status: AC
  Filled 2024-04-10 (×2): qty 90, 90d supply, fill #0

## 2024-04-09 NOTE — Progress Notes (Signed)
 Office: [phone]  /  Fax: [phone]  WEIGHT SUMMARY AND BIOMETRICS  Anthropometric Measurements Height: 5' 2 (1.575 m) Weight: 266 lb (120.7 kg) BMI (Calculated): 48.64 Weight at Last Visit: 269 lb Weight Lost Since Last Visit: 3 lb Weight Gained Since Last Visit: 0 Starting Weight: 311 lb Total Weight Loss (lbs): 45 lb (20.4 kg) Peak Weight: 311 lb   Body Composition  Body Fat %: 51.1 % Fat Mass (lbs): 136 lbs Muscle Mass (lbs): 123.4 lbs Total Body Water (lbs): 92.8 lbs Visceral Fat Rating : 17   Other Clinical Data Fasting: yes Labs: no Today's Visit #: 45 Starting Date: 06/15/21    Chief Complaint: OBESITY    History of Present Illness Tara Kelley is a 43 year old female who presents for obesity treatment follow-up and progress assessment.  She adheres to a journaling plan with a target of 1700 calories and over 100 grams of protein daily, achieving this approximately 70% of the time. Her exercise regimen includes 45 minutes of strength training three days a week. Since her last visit three weeks ago, she has lost three pounds, contributing to a total weight loss of 45 pounds since starting the program.  She takes Prilosec 20 mg for GERD and requests a refill. She is on Crestor  5 mg for hyperlipidemia and requests a refill. She is on ergocalciferol  50,000 IU weekly and requests a refill. Emotional eating behaviors are managed with Celexa  and Wellbutrin  SR 200 mg twice daily, and she requests a refill for Wellbutrin .  She reports manageable hunger since starting a 7.5 mg dose of Zepbound , although there is a two-day delay in hunger suppression. Eating a small amount helps with nausea. Heartburn is well-controlled with Prilosec, and initial side effects of Zepbound , such as burping, have subsided.  Her social history includes regular early morning gym visits at [employer] for strength training, which she finds supportive and motivating. She plans to  travel to [place] for Christmas with her mother, intending to maintain her dietary habits and exercise routine during the holiday.  She recently received a job offer with a significant salary increase and a signing bonus, which has been a source of excitement and some stress, affecting her sleep. She plans to start the new job on January 12th after a pre-planned vacation.      PHYSICAL EXAM:  Blood pressure 121/83, pulse 65, temperature 98 F (36.7 C), height 5' 2 (1.575 m), weight 266 lb (120.7 kg), SpO2 99%. Body mass index is 48.65 kg/m.  DIAGNOSTIC DATA REVIEWED:  BMET    Component Value Date/Time   NA 137 10/01/2023 0830   K 3.5 10/01/2023 0830   CL 98 10/01/2023 0830   CO2 22 10/01/2023 0830   GLUCOSE 72 10/01/2023 0830   GLUCOSE 103 (H) 07/22/2023 0559   BUN 13 10/01/2023 0830   CREATININE 0.91 10/01/2023 0830   CALCIUM  9.2 10/01/2023 0830   GFRNONAA >60 07/22/2023 0559   GFRAA >90 07/20/2014 2128   Lab Results  Component Value Date   HGBA1C 5.0 10/01/2023   HGBA1C 5.4 06/15/2021   Lab Results  Component Value Date   INSULIN  17.2 10/01/2023   INSULIN  20.6 06/15/2021   Lab Results  Component Value Date   TSH 1.700 10/01/2023   CBC    Component Value Date/Time   WBC 7.9 10/01/2023 0830   WBC 13.0 (H) 07/22/2023 0559   RBC 5.03 10/01/2023 0830   RBC 4.53 07/22/2023 0559   HGB 15.1 10/01/2023 0830  HCT 46.6 10/01/2023 0830   PLT 349 10/01/2023 0830   MCV 93 10/01/2023 0830   MCH 30.0 10/01/2023 0830   MCH 30.9 07/22/2023 0559   MCHC 32.4 10/01/2023 0830   MCHC 34.9 07/22/2023 0559   RDW 12.8 10/01/2023 0830   Iron Studies No results found for: IRON, TIBC, FERRITIN, IRONPCTSAT Lipid Panel     Component Value Date/Time   CHOL 245 (H) 10/01/2023 0830   TRIG 162 (H) 10/01/2023 0830   HDL 48 10/01/2023 0830   CHOLHDL 4.4 06/15/2021 1036   LDLCALC 167 (H) 10/01/2023 0830   Hepatic Function Panel     Component Value Date/Time   PROT 7.0  10/01/2023 0830   ALBUMIN 4.2 10/01/2023 0830   AST 18 10/01/2023 0830   ALT 14 10/01/2023 0830   ALKPHOS 112 10/01/2023 0830   BILITOT 0.5 10/01/2023 0830      Component Value Date/Time   TSH 1.700 10/01/2023 0830   Nutritional Lab Results  Component Value Date   VD25OH 54.3 10/01/2023   VD25OH 42.1 10/12/2022   VD25OH 39.7 02/20/2022     Assessment and Plan Assessment & Plan Obesity, class 3 She has lost 3 pounds in the last 3 weeks, totaling 45 pounds since starting the program. She is on a 1700 calorie diet with 100 grams of protein, which she adheres to about 70% of the time. She engages in strengthening exercises for 45 minutes, three days a week. She is on Zepbound  7.5 mg, which has helped manage hunger, though she experiences nausea on the second day after administration. She is mindful of her diet, especially during the holidays, and plans to maintain a balanced diet with a focus on protein intake. - Continue current diet and exercise regimen. - Continue Zepbound  7.5 mg. - Encouraged protein intake first during meals. - Advised on maintaining a balanced diet during holidays.  Mixed hyperlipidemia Managed primarily through diet, exercise, and weight loss. She is on Crestor  5 mg and requests a refill. - Continue Crestor  5 mg. - Encouraged continued diet and exercise regimen.  Gastroesophageal reflux disease GERD is well-managed on Prilosec 20 mg. She reports no issues with heartburn as long as she takes Prilosec and has food in her stomach. - Continue Prilosec 20 mg.  Vitamin D  deficiency Managed with ergocalciferol  50,000 IU weekly. She requests a refill. - Continue ergocalciferol  50,000 IU weekly.  Emotional eating behaviors Managed with Celexa  and Wellbutrin  SR 200 mg twice daily. She requests a refill of Wellbutrin . - Continue Wellbutrin  SR 200 mg twice daily.      Patients who are on anti-obesity medications are counseled on the importance of maintaining  healthy lifestyle habits, including balanced nutrition, regular physical activity, and behavioral modifications,  Medication is an adjunct to, not a replacement for, lifestyle changes and that the long-term success and weight maintenance depend on continued adherence to these strategies.   Neely was informed of the importance of frequent follow up visits to maximize her success with intensive lifestyle modifications for her obesity and obesity related health conditions as recommended by USPSTF and CMS guidelines  Louann Penton, MD

## 2024-04-10 ENCOUNTER — Other Ambulatory Visit (HOSPITAL_BASED_OUTPATIENT_CLINIC_OR_DEPARTMENT_OTHER): Payer: Self-pay

## 2024-04-10 ENCOUNTER — Encounter (INDEPENDENT_AMBULATORY_CARE_PROVIDER_SITE_OTHER): Payer: Self-pay | Admitting: Family Medicine

## 2024-04-10 ENCOUNTER — Encounter: Payer: Self-pay | Admitting: Dermatology

## 2024-04-10 ENCOUNTER — Ambulatory Visit: Admitting: Dermatology

## 2024-04-10 ENCOUNTER — Other Ambulatory Visit: Payer: Self-pay

## 2024-04-10 DIAGNOSIS — L900 Lichen sclerosus et atrophicus: Secondary | ICD-10-CM

## 2024-04-10 DIAGNOSIS — L649 Androgenic alopecia, unspecified: Secondary | ICD-10-CM | POA: Diagnosis not present

## 2024-04-10 DIAGNOSIS — F418 Other specified anxiety disorders: Secondary | ICD-10-CM | POA: Insufficient documentation

## 2024-04-10 DIAGNOSIS — Z79899 Other long term (current) drug therapy: Secondary | ICD-10-CM

## 2024-04-10 MED ORDER — SAFETY SEAL MISCELLANEOUS MISC: 6 refills | Status: AC

## 2024-04-10 MED ORDER — CLOBETASOL PROPIONATE 0.05 % EX OINT
1.0000 | TOPICAL_OINTMENT | Freq: Two times a day (BID) | CUTANEOUS | 6 refills | Status: AC
  Filled 2024-04-10: qty 30, 15d supply, fill #0

## 2024-04-10 MED ORDER — TACROLIMUS 0.1 % EX OINT
1.0000 | TOPICAL_OINTMENT | Freq: Two times a day (BID) | CUTANEOUS | 3 refills | Status: AC
  Filled 2024-04-10: qty 100, 100d supply, fill #0

## 2024-04-10 MED ORDER — TACROLIMUS 0.1 % EX OINT
1.0000 | TOPICAL_OINTMENT | Freq: Two times a day (BID) | CUTANEOUS | 3 refills | Status: DC
  Filled 2024-04-10: qty 100, 50d supply, fill #0

## 2024-04-10 MED ORDER — SAFETY SEAL MISCELLANEOUS MISC
6 refills | Status: DC
  Filled 2024-04-10: qty 30, fill #0

## 2024-04-10 MED ORDER — CLOBETASOL PROPIONATE 0.05 % EX OINT
1.0000 | TOPICAL_OINTMENT | Freq: Two times a day (BID) | CUTANEOUS | 6 refills | Status: DC
  Filled 2024-04-10: qty 30, 15d supply, fill #0

## 2024-04-10 NOTE — Progress Notes (Signed)
" ° °  Follow-Up Visit  Patient (and/or pt guardian) consented to the use of AI-assisted tools for note generation.    Subjective  Tara Kelley is a 43 y.o. female who presents for the following: Androgenetic Alopecia and Lichen Sclerosis  Androgenetic Alopecia  Patient was last evaluated on 11/05/23.  At this visit patient was prescribed compounded solution of minoxidil 8% and finasteride 0.1% for daily use  Recommended to start Viviscal hair growth supplement and Vital proteins collagen supplement Patient reports sxs are better and has noticed some hair growth and is using the solution every morning  Patient reports she is taking Viviscal daily and most days she also takes the Vital Proteins Collagen Supplement Patient denies medication changes.  Lichen Sclerosus  Patient was advised to continue alternating topical therapy of Clobetasol  0.05% and Tacrolimus  0.1% Patient reports medications have helped with symptoms and areas are no longer itchy and feel smoother but that there is a small amount of pigment on areas  The following portions of the chart were reviewed this encounter and updated as appropriate: medications, allergies, medical history  Review of Systems:  No other skin or systemic complaints except as noted in HPI or Assessment and Plan.  Objective  Well appearing patient in no apparent distress; mood and affect are within normal limits.  A focused examination was performed of the following areas: Scalp and Abdomen  Relevant exam findings are noted in the Assessment and Plan.              Assessment & Plan    Lichen sclerosus et atrophicus Improving with current treatment regimen. The affected area on the abdomen shows thickening of collagen and a change in texture, though it remains papery. The area where the biopsy was taken is improving more slowly but is still better overall. - Continue alternating Latisse with tacrolimus  for at least one year to prevent  recurrence of inflammation.  Androgenic alopecia Early signs of regrowth with flyaways and baby hairs, particularly in the middle and widow's peak area. The corners are struggling, but there is potential for improvement as new hair follicles are being recruited. Current treatment with topical minoxidil and finasteride is showing positive results. - Continue topical minoxidil and finasteride application. - Massage three drops of topical solution to the entire area. - Use a hair dryer on cool setting to dry the area quickly. - Will consider oral minoxidil if progress is unsatisfactory by May.  Long term medication management.  Patient is using long term (months to years) prescription medication  to control their dermatologic condition.  These medications require periodic monitoring to evaluate for efficacy and side effects and may require periodic laboratory monitoring.    Return in about 5 months (around 09/08/2024) for Androgentic Alopecia/ Lichen Sclerosus F/U.  LILLETTE Lyle Cords, am acting as a neurosurgeon for [employer], DO .   Documentation: I have reviewed the above documentation for accuracy and completeness, and I agree with the above.  Delon Lenis, DO   "

## 2024-04-10 NOTE — Progress Notes (Signed)
 Tara Kelley                                          MRN: [id number]   04/10/2024   The [facility] Quality Team Specialist reviewed this patient medical record for the purposes of chart review for care gap closure. The following were reviewed: abstraction for care gap closure-controlling blood pressure.    [facility] Quality Team

## 2024-04-10 NOTE — Patient Instructions (Signed)

## 2024-04-11 ENCOUNTER — Other Ambulatory Visit: Payer: Self-pay

## 2024-04-11 ENCOUNTER — Other Ambulatory Visit (HOSPITAL_BASED_OUTPATIENT_CLINIC_OR_DEPARTMENT_OTHER): Payer: Self-pay

## 2024-04-12 ENCOUNTER — Other Ambulatory Visit (HOSPITAL_BASED_OUTPATIENT_CLINIC_OR_DEPARTMENT_OTHER): Payer: Self-pay

## 2024-04-21 ENCOUNTER — Other Ambulatory Visit (HOSPITAL_BASED_OUTPATIENT_CLINIC_OR_DEPARTMENT_OTHER): Payer: Self-pay

## 2024-04-22 ENCOUNTER — Other Ambulatory Visit (HOSPITAL_BASED_OUTPATIENT_CLINIC_OR_DEPARTMENT_OTHER): Payer: Self-pay

## 2024-04-23 ENCOUNTER — Other Ambulatory Visit (HOSPITAL_BASED_OUTPATIENT_CLINIC_OR_DEPARTMENT_OTHER): Payer: Self-pay

## 2024-04-30 ENCOUNTER — Other Ambulatory Visit (HOSPITAL_BASED_OUTPATIENT_CLINIC_OR_DEPARTMENT_OTHER): Payer: Self-pay

## 2024-05-14 ENCOUNTER — Ambulatory Visit (INDEPENDENT_AMBULATORY_CARE_PROVIDER_SITE_OTHER): Payer: Self-pay | Admitting: Family Medicine

## 2024-05-27 ENCOUNTER — Other Ambulatory Visit (HOSPITAL_BASED_OUTPATIENT_CLINIC_OR_DEPARTMENT_OTHER): Payer: Self-pay

## 2024-05-27 ENCOUNTER — Other Ambulatory Visit (INDEPENDENT_AMBULATORY_CARE_PROVIDER_SITE_OTHER): Payer: Self-pay | Admitting: Family Medicine

## 2024-05-27 ENCOUNTER — Other Ambulatory Visit: Payer: Self-pay

## 2024-05-27 DIAGNOSIS — E66813 Obesity, class 3: Secondary | ICD-10-CM

## 2024-05-28 ENCOUNTER — Encounter (INDEPENDENT_AMBULATORY_CARE_PROVIDER_SITE_OTHER): Payer: Self-pay | Admitting: Family Medicine

## 2024-05-29 ENCOUNTER — Telehealth (INDEPENDENT_AMBULATORY_CARE_PROVIDER_SITE_OTHER): Admitting: Family Medicine

## 2024-05-29 ENCOUNTER — Other Ambulatory Visit (HOSPITAL_BASED_OUTPATIENT_CLINIC_OR_DEPARTMENT_OTHER): Payer: Self-pay

## 2024-05-29 ENCOUNTER — Encounter (INDEPENDENT_AMBULATORY_CARE_PROVIDER_SITE_OTHER): Payer: Self-pay | Admitting: Family Medicine

## 2024-05-29 VITALS — Ht 62.0 in | Wt 266.0 lb

## 2024-05-29 DIAGNOSIS — Z6841 Body Mass Index (BMI) 40.0 and over, adult: Secondary | ICD-10-CM

## 2024-05-29 DIAGNOSIS — E66813 Obesity, class 3: Secondary | ICD-10-CM | POA: Diagnosis not present

## 2024-05-29 DIAGNOSIS — E782 Mixed hyperlipidemia: Secondary | ICD-10-CM | POA: Diagnosis not present

## 2024-05-29 DIAGNOSIS — E559 Vitamin D deficiency, unspecified: Secondary | ICD-10-CM

## 2024-05-29 MED ORDER — VITAMIN D (ERGOCALCIFEROL) 1.25 MG (50000 UNIT) PO CAPS: 50000.0000 [IU] | ORAL_CAPSULE | ORAL | 0 refills | Status: AC

## 2024-05-29 MED ORDER — TIRZEPATIDE-WEIGHT MANAGEMENT 7.5 MG/0.5ML ~~LOC~~ SOAJ
7.5000 mg | SUBCUTANEOUS | 0 refills | Status: AC
  Filled 2024-05-29: qty 2, 28d supply, fill #0

## 2024-05-29 NOTE — Progress Notes (Signed)
 "  Office: [phone]  /  Fax: [phone]  WEIGHT SUMMARY AND BIOMETRICS  Anthropometric Measurements Height: 5' 2 (1.575 m) Weight: 266 lb (120.7 kg) (last ov) BMI (Calculated): 48.64 Weight at Last Visit: 266 lb Weight Lost Since Last Visit: 0 Weight Gained Since Last Visit: 0 Starting Weight: 311 lb Total Weight Loss (lbs): 45 lb (20.4 kg) Peak Weight: 311 lb   No data recorded Other Clinical Data Fasting: no Labs: no Today's Visit #: 46 Starting Date: 06/15/21    Chief Complaint: OBESITY   Virtual Visit via A/V Note  I connected with Tara Kelley on 05/29/24 at 11:20 AM EST by audiovisual telehealth and verified that I am speaking with the correct person using two identifiers.  Location: Patient: home Provider: clinic   I discussed the limitations, risks, security and privacy concerns of performing an evaluation and management service by AV telehealth and the availability of in person appointments. I also discussed with the patient that there may be a patient responsible charge related to this service. The patient expressed understanding and agreed to proceed.    History of Present Illness Tara Kelley is a 44 year old female with obesity and hyperlipidemia who presents for obesity treatment plan discussion.  She currently weighs 263 pounds and has experienced weight loss since her last visit. She follows a dietary plan of 1700 calories per day, focusing on consuming 100 grams of protein, though meeting protein goals has been challenging. She is incorporating more fruits and vegetables into her diet and increasing water intake. She is not currently exercising.  In addition to obesity, she is managing hyperlipidemia. She is actively working on reducing high cholesterol foods and aims to increase exercise and weight loss to improve her cholesterol levels. She is not currently taking a statin.  She is on Zepbound  7.5 mg, which she tolerates well with  minimal symptoms. Initially, she experienced significant side effects, but these have subsided over time. Adverse effects typically occur two days post-injection but are manageable.  She has not been taking her vitamin D  for over a week as she ran out of her supply.      PHYSICAL EXAM:  Height 5' 2 (1.575 m), weight 266 lb (120.7 kg). Body mass index is 48.65 kg/m.  DIAGNOSTIC DATA REVIEWED BY MYSELF TODAY:  BMET    Component Value Date/Time   NA 137 10/01/2023 0830   K 3.5 10/01/2023 0830   CL 98 10/01/2023 0830   CO2 22 10/01/2023 0830   GLUCOSE 72 10/01/2023 0830   GLUCOSE 103 (H) 07/22/2023 0559   BUN 13 10/01/2023 0830   CREATININE 0.91 10/01/2023 0830   CALCIUM  9.2 10/01/2023 0830   GFRNONAA >60 07/22/2023 0559   GFRAA >90 07/20/2014 2128   Lab Results  Component Value Date   HGBA1C 5.0 10/01/2023   HGBA1C 5.4 06/15/2021   Lab Results  Component Value Date   INSULIN  17.2 10/01/2023   INSULIN  20.6 06/15/2021   Lab Results  Component Value Date   TSH 1.700 10/01/2023   CBC    Component Value Date/Time   WBC 7.9 10/01/2023 0830   WBC 13.0 (H) 07/22/2023 0559   RBC 5.03 10/01/2023 0830   RBC 4.53 07/22/2023 0559   HGB 15.1 10/01/2023 0830   HCT 46.6 10/01/2023 0830   PLT 349 10/01/2023 0830   MCV 93 10/01/2023 0830   MCH 30.0 10/01/2023 0830   MCH 30.9 07/22/2023 0559   MCHC 32.4 10/01/2023 0830  MCHC 34.9 07/22/2023 0559   RDW 12.8 10/01/2023 0830   Iron Studies No results found for: IRON, TIBC, FERRITIN, IRONPCTSAT Lipid Panel     Component Value Date/Time   CHOL 245 (H) 10/01/2023 0830   TRIG 162 (H) 10/01/2023 0830   HDL 48 10/01/2023 0830   CHOLHDL 4.4 06/15/2021 1036   LDLCALC 167 (H) 10/01/2023 0830   Hepatic Function Panel     Component Value Date/Time   PROT 7.0 10/01/2023 0830   ALBUMIN 4.2 10/01/2023 0830   AST 18 10/01/2023 0830   ALT 14 10/01/2023 0830   ALKPHOS 112 10/01/2023 0830   BILITOT 0.5 10/01/2023 0830       Component Value Date/Time   TSH 1.700 10/01/2023 0830   Nutritional Lab Results  Component Value Date   VD25OH 54.3 10/01/2023   VD25OH 42.1 10/12/2022   VD25OH 39.7 02/20/2022     Assessment and Plan Assessment & Plan Obesity Class 3 obesity with recent weight loss. Following a 1700 calorie diet with increased protein intake, though meeting protein goals remains challenging. Not currently exercising due to weather conditions. On Zepbound  7.5 mg with acclimation to medication, experiencing minimal symptoms post-injection. - Continue Zepbound  7.5 mg. - Encouraged finding an exercise video for home workouts. - Encouraged creative meal planning to meet protein goals, such as using a crock pot for chicken and incorporating quinoa and vegetables.  Mixed hyperlipidemia Managed with dietary modifications and weight loss. Not on a statin. Working on decreasing high cholesterol foods and increasing exercise to improve cholesterol levels. - Continue dietary modifications to decrease high cholesterol foods. - Encouraged weight loss and increased exercise.  Vitamin D  deficiency Managed with ergocalciferol  50,000 IU weekly. Over a week without vitamin D  supplementation due to running out of medication. - Sent prescription for ergocalciferol  50,000 IU weekly to pharmacy.      Patients who are on anti-obesity medications are counseled on the importance of maintaining healthy lifestyle habits, including balanced nutrition, regular physical activity, and behavioral modifications,  Medication is an adjunct to, not a replacement for, lifestyle changes and that the long-term success and weight maintenance depend on continued adherence to these strategies.   Tara Kelley was informed of the importance of frequent follow up visits to maximize her success with intensive lifestyle modifications for her obesity and obesity related health conditions as recommended by USPSTF and CMS guidelines  Louann Penton, MD   "

## 2024-06-12 ENCOUNTER — Ambulatory Visit (INDEPENDENT_AMBULATORY_CARE_PROVIDER_SITE_OTHER): Admitting: Family Medicine

## 2024-07-10 ENCOUNTER — Ambulatory Visit (INDEPENDENT_AMBULATORY_CARE_PROVIDER_SITE_OTHER): Admitting: Family Medicine
